# Patient Record
Sex: Female | Born: 1955
Health system: Southern US, Community
[De-identification: ages and names within clinical notes are randomized; demographics above are authoritative.]

## PROBLEM LIST (undated history)

## (undated) DIAGNOSIS — J189 Pneumonia, unspecified organism: Secondary | ICD-10-CM

## (undated) DIAGNOSIS — R32 Unspecified urinary incontinence: Secondary | ICD-10-CM

## (undated) DIAGNOSIS — N281 Cyst of kidney, acquired: Secondary | ICD-10-CM

## (undated) DIAGNOSIS — J449 Chronic obstructive pulmonary disease, unspecified: Secondary | ICD-10-CM

## (undated) DIAGNOSIS — M8580 Other specified disorders of bone density and structure, unspecified site: Secondary | ICD-10-CM

## (undated) DIAGNOSIS — Z87891 Personal history of nicotine dependence: Secondary | ICD-10-CM

## (undated) DIAGNOSIS — K219 Gastro-esophageal reflux disease without esophagitis: Secondary | ICD-10-CM

## (undated) DIAGNOSIS — E785 Hyperlipidemia, unspecified: Secondary | ICD-10-CM

## (undated) DIAGNOSIS — S129XXA Fracture of neck, unspecified, initial encounter: Secondary | ICD-10-CM

## (undated) DIAGNOSIS — Z8659 Personal history of other mental and behavioral disorders: Secondary | ICD-10-CM

## (undated) DIAGNOSIS — F419 Anxiety disorder, unspecified: Secondary | ICD-10-CM

## (undated) DIAGNOSIS — S78112A Complete traumatic amputation at level between left hip and knee, initial encounter: Secondary | ICD-10-CM

## (undated) DIAGNOSIS — I700 Atherosclerosis of aorta: Secondary | ICD-10-CM

## (undated) DIAGNOSIS — Z95828 Presence of other vascular implants and grafts: Secondary | ICD-10-CM

## (undated) DIAGNOSIS — M5136 Other intervertebral disc degeneration, lumbar region: Secondary | ICD-10-CM

## (undated) HISTORY — DX: Cyst of kidney, acquired: N28.1

## (undated) HISTORY — PX: IVC FILTER PLACEMENT (ARMC HX): HXRAD1551

## (undated) HISTORY — DX: Pneumonia, unspecified organism: J18.9

## (undated) HISTORY — DX: Complete traumatic amputation at level between left hip and knee, initial encounter: S78.112A

## (undated) HISTORY — DX: Chronic obstructive pulmonary disease, unspecified: J44.9

## (undated) HISTORY — DX: Personal history of nicotine dependence: Z87.891

## (undated) HISTORY — DX: Unspecified urinary incontinence: R32

## (undated) HISTORY — DX: Other intervertebral disc degeneration, lumbar region: M51.36

## (undated) HISTORY — DX: Atherosclerosis of aorta: I70.0

## (undated) HISTORY — DX: Presence of other vascular implants and grafts: Z95.828

## (undated) HISTORY — DX: Personal history of other mental and behavioral disorders: Z86.59

## (undated) HISTORY — DX: Other specified disorders of bone density and structure, unspecified site: M85.80

---

## 1998-08-31 HISTORY — PX: PARTIAL HYSTERECTOMY: SHX80

## 2000-09-01 ENCOUNTER — Encounter: Payer: Self-pay | Admitting: Family Medicine

## 2000-09-01 ENCOUNTER — Ambulatory Visit (HOSPITAL_COMMUNITY): Admission: RE | Admit: 2000-09-01 | Discharge: 2000-09-01 | Payer: Self-pay | Admitting: *Deleted

## 2003-07-12 ENCOUNTER — Encounter (INDEPENDENT_AMBULATORY_CARE_PROVIDER_SITE_OTHER): Payer: Self-pay | Admitting: Specialist

## 2003-07-12 ENCOUNTER — Ambulatory Visit (HOSPITAL_BASED_OUTPATIENT_CLINIC_OR_DEPARTMENT_OTHER): Admission: RE | Admit: 2003-07-12 | Discharge: 2003-07-12 | Payer: Self-pay | Admitting: Obstetrics and Gynecology

## 2003-07-12 ENCOUNTER — Ambulatory Visit (HOSPITAL_COMMUNITY): Admission: RE | Admit: 2003-07-12 | Discharge: 2003-07-12 | Payer: Self-pay | Admitting: Obstetrics and Gynecology

## 2003-10-30 ENCOUNTER — Encounter (INDEPENDENT_AMBULATORY_CARE_PROVIDER_SITE_OTHER): Payer: Self-pay | Admitting: Specialist

## 2003-10-30 ENCOUNTER — Inpatient Hospital Stay (HOSPITAL_COMMUNITY): Admission: RE | Admit: 2003-10-30 | Discharge: 2003-11-01 | Payer: Self-pay | Admitting: Obstetrics and Gynecology

## 2005-08-31 DIAGNOSIS — M51369 Other intervertebral disc degeneration, lumbar region without mention of lumbar back pain or lower extremity pain: Secondary | ICD-10-CM

## 2005-08-31 DIAGNOSIS — M5136 Other intervertebral disc degeneration, lumbar region: Secondary | ICD-10-CM

## 2005-08-31 HISTORY — DX: Other intervertebral disc degeneration, lumbar region without mention of lumbar back pain or lower extremity pain: M51.369

## 2005-08-31 HISTORY — DX: Other intervertebral disc degeneration, lumbar region: M51.36

## 2005-08-31 HISTORY — PX: LUMBAR DISC SURGERY: SHX700

## 2006-08-31 DIAGNOSIS — Z95828 Presence of other vascular implants and grafts: Secondary | ICD-10-CM

## 2006-08-31 DIAGNOSIS — S78112A Complete traumatic amputation at level between left hip and knee, initial encounter: Secondary | ICD-10-CM

## 2006-08-31 DIAGNOSIS — N281 Cyst of kidney, acquired: Secondary | ICD-10-CM

## 2006-08-31 DIAGNOSIS — S129XXA Fracture of neck, unspecified, initial encounter: Secondary | ICD-10-CM

## 2006-08-31 HISTORY — DX: Presence of other vascular implants and grafts: Z95.828

## 2006-08-31 HISTORY — PX: FRACTURE SURGERY: SHX138

## 2006-08-31 HISTORY — DX: Fracture of neck, unspecified, initial encounter: S12.9XXA

## 2006-08-31 HISTORY — PX: LEG AMPUTATION ABOVE KNEE: SHX117

## 2006-08-31 HISTORY — DX: Complete traumatic amputation at level between left hip and knee, initial encounter: S78.112A

## 2006-08-31 HISTORY — PX: ROTATOR CUFF REPAIR: SHX139

## 2006-08-31 HISTORY — PX: OTHER SURGICAL HISTORY: SHX169

## 2006-08-31 HISTORY — DX: Cyst of kidney, acquired: N28.1

## 2006-08-31 HISTORY — PX: ANKLE SURGERY: SHX546

## 2006-09-24 ENCOUNTER — Inpatient Hospital Stay (HOSPITAL_COMMUNITY): Admission: RE | Admit: 2006-09-24 | Discharge: 2006-09-28 | Payer: Self-pay | Admitting: Neurosurgery

## 2007-06-11 ENCOUNTER — Inpatient Hospital Stay (HOSPITAL_COMMUNITY): Admission: AC | Admit: 2007-06-11 | Discharge: 2007-06-12 | Payer: Self-pay

## 2007-10-12 ENCOUNTER — Encounter: Admission: RE | Admit: 2007-10-12 | Discharge: 2007-12-27 | Payer: Self-pay | Admitting: Orthopedic Surgery

## 2008-06-04 ENCOUNTER — Encounter: Admission: RE | Admit: 2008-06-04 | Discharge: 2008-07-05 | Payer: Self-pay | Admitting: Orthopaedic Surgery
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
## 2009-04-03 ENCOUNTER — Encounter: Admission: RE | Admit: 2009-04-03 | Discharge: 2009-07-02 | Payer: Self-pay | Admitting: Orthopaedic Surgery

## 2009-07-16 ENCOUNTER — Encounter: Admission: RE | Admit: 2009-07-16 | Discharge: 2009-08-14 | Payer: Self-pay | Admitting: Orthopaedic Surgery

## 2010-07-01 ENCOUNTER — Encounter
Admission: RE | Admit: 2010-07-01 | Discharge: 2010-08-26 | Payer: Self-pay | Source: Home / Self Care | Attending: Orthopaedic Surgery | Admitting: Orthopaedic Surgery

## 2010-09-15 ENCOUNTER — Encounter
Admission: RE | Admit: 2010-09-15 | Discharge: 2010-09-30 | Payer: Self-pay | Source: Home / Self Care | Attending: Orthopaedic Surgery | Admitting: Orthopaedic Surgery

## 2010-09-15 ENCOUNTER — Encounter: Admission: RE | Admit: 2010-09-15 | Payer: Self-pay | Source: Home / Self Care | Admitting: Orthopaedic Surgery

## 2010-10-02 ENCOUNTER — Ambulatory Visit: Payer: Medicare Other | Attending: Orthopaedic Surgery | Admitting: Physical Therapy

## 2010-10-02 DIAGNOSIS — R269 Unspecified abnormalities of gait and mobility: Secondary | ICD-10-CM | POA: Insufficient documentation

## 2010-10-02 DIAGNOSIS — R5381 Other malaise: Secondary | ICD-10-CM | POA: Insufficient documentation

## 2010-10-02 DIAGNOSIS — S78119A Complete traumatic amputation at level between unspecified hip and knee, initial encounter: Secondary | ICD-10-CM | POA: Insufficient documentation

## 2010-10-02 DIAGNOSIS — M6281 Muscle weakness (generalized): Secondary | ICD-10-CM | POA: Insufficient documentation

## 2010-10-02 DIAGNOSIS — IMO0001 Reserved for inherently not codable concepts without codable children: Secondary | ICD-10-CM | POA: Insufficient documentation

## 2010-10-09 ENCOUNTER — Ambulatory Visit: Payer: Medicare Other | Admitting: Physical Therapy

## 2010-10-15 ENCOUNTER — Ambulatory Visit: Payer: Medicare Other | Admitting: Physical Therapy

## 2010-10-22 ENCOUNTER — Ambulatory Visit: Payer: Medicare Other | Admitting: Physical Therapy

## 2010-10-28 ENCOUNTER — Encounter: Payer: Medicare Other | Admitting: Physical Therapy

## 2010-11-05 ENCOUNTER — Ambulatory Visit: Payer: Medicare Other | Attending: Orthopaedic Surgery | Admitting: Physical Therapy

## 2010-11-05 ENCOUNTER — Encounter: Payer: Medicare Other | Admitting: Physical Therapy

## 2010-11-05 DIAGNOSIS — M6281 Muscle weakness (generalized): Secondary | ICD-10-CM | POA: Insufficient documentation

## 2010-11-05 DIAGNOSIS — IMO0001 Reserved for inherently not codable concepts without codable children: Secondary | ICD-10-CM | POA: Insufficient documentation

## 2010-11-05 DIAGNOSIS — S78119A Complete traumatic amputation at level between unspecified hip and knee, initial encounter: Secondary | ICD-10-CM | POA: Insufficient documentation

## 2010-11-05 DIAGNOSIS — R269 Unspecified abnormalities of gait and mobility: Secondary | ICD-10-CM | POA: Insufficient documentation

## 2010-11-05 DIAGNOSIS — R5381 Other malaise: Secondary | ICD-10-CM | POA: Insufficient documentation

## 2010-11-12 ENCOUNTER — Ambulatory Visit: Payer: Medicare Other | Admitting: Physical Therapy

## 2010-11-21 ENCOUNTER — Ambulatory Visit: Payer: Medicare Other | Admitting: Physical Therapy

## 2010-11-26 ENCOUNTER — Ambulatory Visit: Payer: Medicare Other | Admitting: Physical Therapy

## 2010-12-03 ENCOUNTER — Ambulatory Visit: Payer: Medicare Other | Attending: Orthopaedic Surgery | Admitting: Physical Therapy

## 2010-12-03 DIAGNOSIS — R269 Unspecified abnormalities of gait and mobility: Secondary | ICD-10-CM | POA: Insufficient documentation

## 2010-12-03 DIAGNOSIS — R5381 Other malaise: Secondary | ICD-10-CM | POA: Insufficient documentation

## 2010-12-03 DIAGNOSIS — M6281 Muscle weakness (generalized): Secondary | ICD-10-CM | POA: Insufficient documentation

## 2010-12-03 DIAGNOSIS — IMO0001 Reserved for inherently not codable concepts without codable children: Secondary | ICD-10-CM | POA: Insufficient documentation

## 2010-12-03 DIAGNOSIS — S78119A Complete traumatic amputation at level between unspecified hip and knee, initial encounter: Secondary | ICD-10-CM | POA: Insufficient documentation

## 2010-12-10 ENCOUNTER — Ambulatory Visit: Payer: Medicare Other | Admitting: Physical Therapy

## 2010-12-17 ENCOUNTER — Ambulatory Visit: Payer: Medicare Other | Admitting: Physical Therapy

## 2010-12-24 ENCOUNTER — Ambulatory Visit: Payer: Medicare Other | Admitting: Physical Therapy

## 2010-12-31 ENCOUNTER — Ambulatory Visit: Payer: Medicare Other | Attending: Orthopaedic Surgery | Admitting: Physical Therapy

## 2010-12-31 DIAGNOSIS — IMO0001 Reserved for inherently not codable concepts without codable children: Secondary | ICD-10-CM | POA: Insufficient documentation

## 2010-12-31 DIAGNOSIS — M6281 Muscle weakness (generalized): Secondary | ICD-10-CM | POA: Insufficient documentation

## 2010-12-31 DIAGNOSIS — S78119A Complete traumatic amputation at level between unspecified hip and knee, initial encounter: Secondary | ICD-10-CM | POA: Insufficient documentation

## 2010-12-31 DIAGNOSIS — R269 Unspecified abnormalities of gait and mobility: Secondary | ICD-10-CM | POA: Insufficient documentation

## 2010-12-31 DIAGNOSIS — R5381 Other malaise: Secondary | ICD-10-CM | POA: Insufficient documentation

## 2011-01-07 ENCOUNTER — Ambulatory Visit: Payer: Medicare Other | Admitting: Physical Therapy
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
## 2011-01-14 ENCOUNTER — Ambulatory Visit: Payer: Medicare Other | Admitting: Physical Therapy

## 2011-01-16 NOTE — Op Note (Signed)
NAMEMATIKA, BARTELL                ACCOUNT NO.:  [id number]   MEDICAL RECORD NO.:  [id number]          PATIENT TYPE:  INP   LOCATION:  2899                         FACILITY:  [facility]   PHYSICIAN:  Danae Orleans. Venetia Maxon, M.D.  DATE OF BIRTH:  1956/05/08   DATE OF PROCEDURE:  09/24/2006  DATE OF DISCHARGE:                               OPERATIVE REPORT   PREOPERATIVE DIAGNOSIS:  Herniated lumbar disk with spondylosis,  degenerative disease and radiculopathy L5-S1 level.   POSTOPERATIVE DIAGNOSIS:  Herniated lumbar disk with spondylosis,  degenerative disease and radiculopathy L5-S1 level.   PROCEDURE:  L5-S1 decompression and bilateral diskectomy with posterior  lumbar interbody fusion (11 mm PEEK spacers) with local bone autograft  and Osteocel with pedicle screw fixation L5-S1 levels bilaterally with  posterolateral arthrodesis.   SURGEON:  Danae Orleans. Venetia Maxon, M.D.   ASSISTANT:  Clydene Fake, M.D.   ANESTHESIA:  General endotracheal anesthesia.   BLOOD LOSS:  Minimal.   COMPLICATIONS:  None.   DISPOSITION:  Recovery.   INDICATIONS:  Rachel Mcintosh is a 55 year old woman with a long history of  low back pain and right leg pain.  She has a large disk herniation at L5-  S1 on the right but also has significant disk degenerative changes with  Modic changes on MRI and disk space height loss with sclerotic endplate  degenerative changes.  It was elected to take her to surgery for  decompression and fusion at the L5-S1 level.   PROCEDURE:  Ms. Dickison was brought to the operating room.  Following the  satisfactory and uncomplicated induction of general endotracheal  anesthesia and placement of intravenous lines, the patient was placed in  a prone position on the operating table on chest rolls.  Her low back  was then prepped and draped in the usual sterile fashion.  The area of  planned incision was infiltrated with 0.25% Marcaine and 0.50% lidocaine  with 1:200,000 epinephrine.   Incision was made in the midline, carried  through adipose tissue to the lumbodorsal fascia which was incised  bilaterally.  Subperiosteal dissection was performed exposing the L5  transverse processes of the sacral alae and self-retaining retractor was  placed to facilitate exposure.  Intraoperative x-ray confirmed correct  orientation with marker probes on the L5 transverse process and sacral  ala.  Subsequently bilateral laminectomies of L5 were performed using a  Leksell rongeur and Kerrison rongeurs and partial facetectomy was also  performed.  The bone was saved and cleaned and later used for bone  grafting.  Under loupe magnification, the right side of the canal was  explored and multiple large fragments of herniated disk material were  removed which were directly compressing the lateral aspect of the thecal  sac and S1 nerve root.  Subsequently the interspace was incised and disk  material was removed but the disk space was quite collapsed.  Attention  was then turned to the left side where similar decompression was  performed and at this endplate preparation shavers were placed and  sequentially dilated with decompression of the interspace and eventually  this disk  space was loosened significantly to be able to gain access  into the interspace and medial and lateral aspects of the interspace  were cleared of disk material.  Using the sequential shavers up to a 10  mm size, the endplates were prepared for implants and the interspace was  aggressively evacuated of any residual disk material.  An 11 mm x 20 mm  trial sizer was placed on the left and attention was then returned to  the right side where again the endplates were prepared for grafting and  the interspace was evacuated of residual disk material.  Subsequently a  11 x 20 mm insert and rotate PEEK cage was selected.  This was packed  with morcellized bone autograft supplemented with Osteocel and inserted  in the interspace,  countersunk appropriately and rotated into position.  Attention was then returned to the opposite side and a large amount of  bone graft was then inserted in the interspace and tamped into position  both medially and laterally. Approximately 10 mL of bone graft was  eventually placed within the interspace.  A similarly sized cage was  placed and rotated into position, then additional bone graft was  inserted medial to this, lateral to it and on the other side as well.  Lateral C-arm radiograph showed restoration of disk space height and  well-positioned cages.  Subsequently, pedicle screws were then placed  using 6.5 x 40 mm sacral screws at the S1 level and 6.5 x 45 mm screws  at L5.  All screws had excellent purchase, no evidence of any cutouts  and this positioning screws was confirmed on AP and lateral fluoroscopy.  Prior to placing the rods and screws, the L5-S1 levels were decorticated  for posterolateral bone grafting and this was then performed and the  remaining bone graft was placed overlying the facet joint complexes in  the posterolateral region.  The 35 mm rods were then placed and locked  down in situ.  The neural elements were inspected and found to be in  good repair.  The retractors were then removed, the lumbodorsal fascia  was closed with #1 Vicryl sutures.  The subcutaneous tissues were  reapproximated with 2-0 Vicryl interrupted inverted sutures.  The skin  edges were approximated interrupted with 3-0 Vicryl subcuticular stitch.  The wound was dressed with Benzoin and Steri-Strips with gauze and tape.  The patient was extubated in the operating room, taken to the recovery  in stable satisfactory condition, have tolerated her operation well.  Counts were correct at the end of the case.      Danae Orleans. Venetia Maxon, M.D.  Electronically Signed     JDS/MEDQ  D:  09/24/2006  T:  09/24/2006  Job:  [id number]

## 2011-01-16 NOTE — Discharge Summary (Signed)
[name], [name]                ACCOUNT NO.:  [id number]   MEDICAL RECORD NO.:  [id number]          PATIENT TYPE:  INP   LOCATION:  3001                         FACILITY:  [facility]   PHYSICIAN:  Danae Orleans. Venetia Maxon, M.D.  DATE OF BIRTH:  01-31-1956   DATE OF ADMISSION:  09/24/2006  DATE OF DISCHARGE:  09/28/2006                               DISCHARGE SUMMARY   REASON FOR ADMISSION:  Lumbar disk disease with myelopathy, tobacco use  disorder, bone cartilage disorder, esophageal reflux.   FINAL DIAGNOSIS:  Lumbar disk disease with myelopathy, tobacco use  disorder, bone cartilage disorder, esophageal reflux.   HISTORY OF ILLNESS AND HOSPITAL COURSE:  Rachel Mcintosh is a 50-year  woman with lumbar disk herniation at L5-S1 and marked degenerative disk  disease.  She was admitted to the hospital, underwent uncomplicated  lumbar decompression and fusion at the L5-S1 level.  She tolerated the  procedure well and was gradually mobilized with physical therapy and was  discharged home in improved state with discharge medications of  Percocet, Valium, and Lyrica with follow up in 3 weeks in the office.      Danae Orleans. Venetia Maxon, M.D.  Electronically Signed     JDS/MEDQ  D:  12/30/2006  T:  12/31/2006  Job:  [id number]

## 2011-01-16 NOTE — H&P (Signed)
NAME:  Rachel Mcintosh, Rachel Mcintosh                          ACCOUNT NO.:  [id number]   MEDICAL RECORD NO.:  [id number]                   PATIENT TYPE:  INP   LOCATION:  NA                                   FACILITY:  [facility]   PHYSICIAN:  Katherine Roan, M.D.               DATE OF BIRTH:  1955-11-08   DATE OF ADMISSION:  DATE OF DISCHARGE:                                HISTORY & PHYSICAL   CHIEF COMPLAINT:  Continued heavy periods.   HISTORY OF PRESENT ILLNESS:  Rachel Mcintosh is a 55 year old gravida 3, para 3  female whose last menstrual period was approximately 12 days ago, who  presents for a hysterectomy for continued heavy periods despite birth  control pills, despite a hysteroscopy with resection.  The endometrial  pathology was benign.  Her Pap smear was normal.   CURRENT MEDICATIONS:  1. Paxil 30 mg.  2. Intermittently Prevacid for reflux.  She states this is getting better     and she has not had to take anything but Tums on a p.r.n. basis.   PAST SURGICAL HISTORY:  1. Tubal ligation.  2. Hysteroscopy.   ALLERGIES:  1. SULFA.  2. CODEINE.  She has no latex allergies.   She states she had a blood transfusion as an infant.   REVIEW OF SYSTEMS:  HEENT:  She wears glasses.  No headaches, no decrease in  visual or auditory acuity.  HEART:  No history of hypertension, no history  of mitral valve prolapse or heart murmur.  LUNGS:  No chronic cough, no  asthma.  She does smoke 1-1/2 packs of cigarettes daily.  GENITOURINARY:  She denies stress or urge.  GASTROINTESTINAL:  She has a history of reflux,  although as noted above, she is doing better, and does not take anything on  a regular basis.  She has had no bowel habit changes.  MUSCLES, BONES, AND  JOINTS:  No fractures or arthritis.   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  She drinks alcohol  socially and smokes 1-1/2 packs of cigarettes daily.   FAMILY HISTORY:  Mother and father are both 94, living and well.  She  has a  paternal grandfather with prostate cancer and a paternal grandmother who has  had a stroke.   PHYSICAL EXAMINATION:  GENERAL:  A well-developed, well-nourished female who  appears to be her stated age of 55.  VITAL SIGNS:  Her weight is 121, blood pressure 120/70, pulse 80.  HEENT:  Unremarkable.  Oropharynx is not injected.  NECK:  Supple.  The thyroid is not enlarged.  Carotid pulses are equal  without bruits.  Trachea is in the midline. No adenopathy appreciated in the  neck or supraclavicular areas.  LUNGS:  Clear to auscultation and percussion.  HEART:  Normal sinus rhythm, no murmurs, no heaves, thrills, rubs, or  gallops.  BREASTS:  No masses  or tenderness.  ABDOMEN:  Liver, spleen, and kidneys are not felt.  No tenderness, no  bruits.  Bowel sounds appear to be normal.  Femoral pulses are equal.  PELVIC:  A clean cervix.  Uterus is retroverted and nodular, it is mobile,  no adnexal masses are noted.  Rectovaginal confirms.  EXTREMITIES:  Good range of motion, equal pulses and reflexes.   IMPRESSION:  Persistent abnormal uterine bleeding despite conservative  efforts.   PLAN:  Total hysterectomy with ovarian conservation if at all possible.  I  have outlined in detail the risks of the procedure, including infection,  hemorrhage, damage to bowel, bladder, vascular injuries, and anesthetic  complications.  Plan is to proceed with hysterectomy.  The patient has  verbalized that she understand all the risks.                                               Katherine Roan, M.D.    SDM/MEDQ  D:  10/29/2003  T:  10/29/2003  Job:  [id number]

## 2011-06-11 LAB — TYPE AND SCREEN: Antibody Screen: NEGATIVE

## 2011-06-11 LAB — CBC
HCT: 16.4 — ABNORMAL LOW
HCT: 25.5 — ABNORMAL LOW
HCT: 27.3 — ABNORMAL LOW
HCT: 36.1
Hemoglobin: 12.6
Hemoglobin: 5.8 — CL
Hemoglobin: 8.7 — ABNORMAL LOW
Hemoglobin: 9.1 — ABNORMAL LOW
Hemoglobin: 9.2 — ABNORMAL LOW
MCHC: 34.1
MCHC: 35.5
MCV: 87.9
MCV: 91.2
MCV: 97.1
Platelets: 103 — ABNORMAL LOW
Platelets: 106 — ABNORMAL LOW
RBC: 2.78 — ABNORMAL LOW
RBC: 3 — ABNORMAL LOW
RBC: 3.72 — ABNORMAL LOW
RDW: 14
RDW: 15.6 — ABNORMAL HIGH
RDW: 18.3 — ABNORMAL HIGH
WBC: 11.6 — ABNORMAL HIGH
WBC: 5.1
WBC: 7.5

## 2011-06-11 LAB — I-STAT 8, (EC8 V) (CONVERTED LAB)
Acid-base deficit: 6 — ABNORMAL HIGH
Bicarbonate: 18.8 — ABNORMAL LOW
Chloride: 101
Glucose, Bld: 147 — ABNORMAL HIGH
Hemoglobin: 13.3
Operator id: 272551
Sodium: 134 — ABNORMAL LOW
TCO2: 20
pCO2, Ven: 35.4 — ABNORMAL LOW

## 2011-06-11 LAB — COMPREHENSIVE METABOLIC PANEL
ALT: 23
Alkaline Phosphatase: 21 — ABNORMAL LOW
Alkaline Phosphatase: 27 — ABNORMAL LOW
BUN: 2 — ABNORMAL LOW
BUN: 8
CO2: 27
Creatinine, Ser: 0.44
GFR calc non Af Amer: >60
Glucose, Bld: 117 — ABNORMAL HIGH
Glucose, Bld: 140 — ABNORMAL HIGH
Potassium: 3.3 — ABNORMAL LOW
Potassium: 4.5
Sodium: 138
Total Protein: 3.3 — ABNORMAL LOW

## 2011-06-11 LAB — POCT I-STAT 7, (LYTES, BLD GAS, ICA,H+H)
Acid-base deficit: 13 — ABNORMAL HIGH
Acid-base deficit: 14 — ABNORMAL HIGH
Bicarbonate: 13.7 — ABNORMAL LOW
Bicarbonate: 14.1 — ABNORMAL LOW
Calcium, Ion: 1.02 — ABNORMAL LOW
HCT: 23 — ABNORMAL LOW
HCT: 31 — ABNORMAL LOW
Hemoglobin: 7.8 — CL
O2 Saturation: 100
Operator id: 117071
Operator id: 177011
Sodium: 139
Sodium: 139
pH, Arterial: 7.162 — CL
pO2, Arterial: 36 — CL
pO2, Arterial: 527 — ABNORMAL HIGH

## 2011-06-11 LAB — ETHANOL: Alcohol, Ethyl (B): 253 — ABNORMAL HIGH

## 2011-06-11 LAB — POCT I-STAT 3, ART BLOOD GAS (G3+)
Acid-base deficit: 6 — ABNORMAL HIGH
Bicarbonate: 20.1
O2 Saturation: 99
Patient temperature: 34.3
TCO2: 21
pO2, Arterial: 126 — ABNORMAL HIGH

## 2011-06-11 LAB — PREPARE FRESH FROZEN PLASMA

## 2011-06-11 LAB — PROTIME-INR
INR: 1.1
INR: 1.5
Prothrombin Time: 17.8 — ABNORMAL HIGH
Prothrombin Time: 18.8 — ABNORMAL HIGH

## 2011-09-02 ENCOUNTER — Ambulatory Visit
Admission: RE | Admit: 2011-09-02 | Discharge: 2011-09-02 | Disposition: A | Discharge status: Home / Self Care | Payer: Medicare Other | Source: Ambulatory Visit | Attending: Family Medicine | Admitting: Family Medicine

## 2011-09-02 ENCOUNTER — Other Ambulatory Visit: Payer: Self-pay | Admitting: Family Medicine

## 2011-09-02 DIAGNOSIS — K219 Gastro-esophageal reflux disease without esophagitis: Secondary | ICD-10-CM | POA: Diagnosis not present

## 2011-09-02 DIAGNOSIS — R079 Chest pain, unspecified: Secondary | ICD-10-CM

## 2012-04-04 DIAGNOSIS — K219 Gastro-esophageal reflux disease without esophagitis: Secondary | ICD-10-CM | POA: Diagnosis not present

## 2012-04-04 DIAGNOSIS — S88919A Complete traumatic amputation of unspecified lower leg, level unspecified, initial encounter: Secondary | ICD-10-CM | POA: Diagnosis not present

## 2012-04-04 DIAGNOSIS — F411 Generalized anxiety disorder: Secondary | ICD-10-CM | POA: Diagnosis not present

## 2012-04-04 DIAGNOSIS — M79609 Pain in unspecified limb: Secondary | ICD-10-CM | POA: Diagnosis not present

## 2012-04-29 ENCOUNTER — Other Ambulatory Visit: Payer: Self-pay | Admitting: Gastroenterology

## 2012-04-29 DIAGNOSIS — K219 Gastro-esophageal reflux disease without esophagitis: Secondary | ICD-10-CM | POA: Diagnosis not present

## 2012-05-05 ENCOUNTER — Ambulatory Visit
Admission: RE | Admit: 2012-05-05 | Discharge: 2012-05-05 | Disposition: A | Discharge status: Home / Self Care | Payer: Medicare Other | Source: Ambulatory Visit | Attending: Gastroenterology | Admitting: Gastroenterology

## 2012-05-05 DIAGNOSIS — K219 Gastro-esophageal reflux disease without esophagitis: Secondary | ICD-10-CM

## 2012-05-11 ENCOUNTER — Ambulatory Visit: Payer: Medicare Other | Admitting: Rehabilitative and Restorative Service Providers"

## 2012-05-12 ENCOUNTER — Ambulatory Visit: Payer: Medicare Other | Admitting: Physical Therapy

## 2012-05-12 DIAGNOSIS — Z1211 Encounter for screening for malignant neoplasm of colon: Secondary | ICD-10-CM | POA: Diagnosis not present

## 2012-05-12 DIAGNOSIS — Z23 Encounter for immunization: Secondary | ICD-10-CM | POA: Diagnosis not present

## 2012-05-12 DIAGNOSIS — F17200 Nicotine dependence, unspecified, uncomplicated: Secondary | ICD-10-CM | POA: Diagnosis not present

## 2012-05-12 DIAGNOSIS — Z13220 Encounter for screening for lipoid disorders: Secondary | ICD-10-CM | POA: Diagnosis not present

## 2012-05-12 DIAGNOSIS — S88919A Complete traumatic amputation of unspecified lower leg, level unspecified, initial encounter: Secondary | ICD-10-CM | POA: Diagnosis not present

## 2012-05-12 DIAGNOSIS — Z0000 Encounter for general adult medical examination without abnormal findings: Secondary | ICD-10-CM | POA: Diagnosis not present

## 2012-05-12 DIAGNOSIS — F411 Generalized anxiety disorder: Secondary | ICD-10-CM | POA: Diagnosis not present

## 2012-05-12 DIAGNOSIS — K219 Gastro-esophageal reflux disease without esophagitis: Secondary | ICD-10-CM | POA: Diagnosis not present

## 2012-05-12 DIAGNOSIS — Z136 Encounter for screening for cardiovascular disorders: Secondary | ICD-10-CM | POA: Diagnosis not present

## 2012-05-23 DIAGNOSIS — K219 Gastro-esophageal reflux disease without esophagitis: Secondary | ICD-10-CM | POA: Diagnosis not present

## 2012-05-23 DIAGNOSIS — Z1211 Encounter for screening for malignant neoplasm of colon: Secondary | ICD-10-CM | POA: Diagnosis not present

## 2012-05-24 DIAGNOSIS — M25559 Pain in unspecified hip: Secondary | ICD-10-CM | POA: Diagnosis not present

## 2012-05-24 DIAGNOSIS — M795 Residual foreign body in soft tissue: Secondary | ICD-10-CM | POA: Diagnosis not present

## 2012-05-31 ENCOUNTER — Encounter (HOSPITAL_COMMUNITY)
Admission: RE | Admit: 2012-05-31 | Discharge: 2012-05-31 | Disposition: A | Discharge status: Home / Self Care | Payer: Medicare Other | Source: Ambulatory Visit | Attending: Orthopedic Surgery | Admitting: Orthopedic Surgery

## 2012-05-31 ENCOUNTER — Other Ambulatory Visit (HOSPITAL_COMMUNITY): Payer: Self-pay | Admitting: Orthopedic Surgery

## 2012-05-31 ENCOUNTER — Encounter (HOSPITAL_COMMUNITY): Payer: Self-pay

## 2012-05-31 HISTORY — PX: COLONOSCOPY: SHX174

## 2012-05-31 HISTORY — DX: Anxiety disorder, unspecified: F41.9

## 2012-05-31 HISTORY — DX: Hyperlipidemia, unspecified: E78.5

## 2012-05-31 HISTORY — DX: Fracture of neck, unspecified, initial encounter: S12.9XXA

## 2012-05-31 HISTORY — DX: Gastro-esophageal reflux disease without esophagitis: K21.9

## 2012-05-31 LAB — COMPREHENSIVE METABOLIC PANEL
Alkaline Phosphatase: 82 U/L (ref 39–117)
BUN: 6 mg/dL (ref 6–23)
Chloride: 99 mEq/L (ref 96–112)
Creatinine, Ser: 0.52 mg/dL (ref 0.50–1.10)
GFR calc Af Amer: >90 mL/min (ref >90)
GFR calc non Af Amer: >90 mL/min (ref >90)
Glucose, Bld: 79 mg/dL (ref 70–99)
Potassium: 3.4 mEq/L — ABNORMAL LOW (ref 3.5–5.1)
Total Bilirubin: 0.3 mg/dL (ref 0.3–1.2)

## 2012-05-31 LAB — SURGICAL PCR SCREEN
MRSA, PCR: NEGATIVE
Staphylococcus aureus: NEGATIVE

## 2012-05-31 LAB — CBC
HCT: 42.3 % (ref 36.0–46.0)
Hemoglobin: 14.8 g/dL (ref 12.0–15.0)
MCV: 87.4 fL (ref 78.0–100.0)
RDW: 12.9 % (ref 11.5–15.5)
WBC: 9.2 10*3/uL (ref 4.0–10.5)

## 2012-05-31 LAB — APTT: aPTT: 32 seconds (ref 24–37)

## 2012-05-31 MED ORDER — CEFAZOLIN SODIUM-DEXTROSE 2-3 GM-% IV SOLR
2.0000 g | INTRAVENOUS | Status: AC
  Administered 2012-06-01: 2 g via INTRAVENOUS
  Filled 2012-05-31: qty 50

## 2012-05-31 NOTE — Pre-Procedure Instructions (Signed)
20 Rachel Mcintosh  05/31/2012   Your procedure is scheduled on:  Wednesday June 01, 2012  Report to [facility] Stay [facility] at 7:45 AM.  Call this number if you have problems the morning of surgery: [phone]   Remember:   Do not eat food or drink :After Midnight.      Take these medicines the morning of surgery with A SIP OF WATER: paxil   Do not wear jewelry, make-up or nail polish.  Do not wear lotions, powders, or perfumes. You may wear deodorant.  Do not shave 48 hours prior to surgery. Men may shave face and neck.  Do not bring valuables to the hospital.  Contacts, dentures or bridgework may not be worn into surgery.  Leave suitcase in the car. After surgery it may be brought to your room.  For patients admitted to the hospital, checkout time is 11:00 AM the day of discharge.   Patients discharged the day of surgery will not be allowed to drive home.  Name and phone number of your driver: family / friend  Special Instructions: Shower using CHG 2 nights before surgery and the night before surgery.  If you shower the day of surgery use CHG.  Use special wash - you have one bottle of CHG for all showers.  You should use approximately 1/3 of the bottle for each shower.   Please read over the following fact sheets that you were given: Pain Booklet, Coughing and Deep Breathing, MRSA Information and Surgical Site Infection Prevention

## 2012-06-01 ENCOUNTER — Encounter (HOSPITAL_COMMUNITY): Payer: Self-pay | Admitting: Critical Care Medicine

## 2012-06-01 ENCOUNTER — Ambulatory Visit (HOSPITAL_COMMUNITY): Payer: Medicare Other | Admitting: Critical Care Medicine

## 2012-06-01 ENCOUNTER — Ambulatory Visit (HOSPITAL_COMMUNITY)
Admission: RE | Admit: 2012-06-01 | Discharge: 2012-06-01 | DRG: 475 | Disposition: A | Discharge status: Home / Self Care | Payer: Medicare Other | Source: Ambulatory Visit | Attending: Orthopedic Surgery | Admitting: Orthopedic Surgery

## 2012-06-01 ENCOUNTER — Encounter (HOSPITAL_COMMUNITY)
Admission: RE | Disposition: A | Discharge status: Home / Self Care | Payer: Self-pay | Source: Ambulatory Visit | Attending: Orthopedic Surgery

## 2012-06-01 DIAGNOSIS — S78119A Complete traumatic amputation at level between unspecified hip and knee, initial encounter: Secondary | ICD-10-CM | POA: Diagnosis not present

## 2012-06-01 DIAGNOSIS — F411 Generalized anxiety disorder: Secondary | ICD-10-CM | POA: Diagnosis present

## 2012-06-01 DIAGNOSIS — T8740 Infection of amputation stump, unspecified extremity: Secondary | ICD-10-CM | POA: Diagnosis present

## 2012-06-01 DIAGNOSIS — K219 Gastro-esophageal reflux disease without esophagitis: Secondary | ICD-10-CM | POA: Diagnosis not present

## 2012-06-01 DIAGNOSIS — Z01812 Encounter for preprocedural laboratory examination: Secondary | ICD-10-CM

## 2012-06-01 DIAGNOSIS — M86659 Other chronic osteomyelitis, unspecified thigh: Secondary | ICD-10-CM | POA: Diagnosis not present

## 2012-06-01 DIAGNOSIS — L02419 Cutaneous abscess of limb, unspecified: Secondary | ICD-10-CM | POA: Diagnosis present

## 2012-06-01 DIAGNOSIS — F17200 Nicotine dependence, unspecified, uncomplicated: Secondary | ICD-10-CM | POA: Diagnosis present

## 2012-06-01 DIAGNOSIS — Z882 Allergy status to sulfonamides status: Secondary | ICD-10-CM

## 2012-06-01 DIAGNOSIS — L03119 Cellulitis of unspecified part of limb: Secondary | ICD-10-CM | POA: Diagnosis not present

## 2012-06-01 DIAGNOSIS — E785 Hyperlipidemia, unspecified: Secondary | ICD-10-CM | POA: Diagnosis not present

## 2012-06-01 DIAGNOSIS — Z888 Allergy status to other drugs, medicaments and biological substances status: Secondary | ICD-10-CM

## 2012-06-01 DIAGNOSIS — Y835 Amputation of limb(s) as the cause of abnormal reaction of the patient, or of later complication, without mention of misadventure at the time of the procedure: Secondary | ICD-10-CM | POA: Diagnosis present

## 2012-06-01 DIAGNOSIS — L02416 Cutaneous abscess of left lower limb: Secondary | ICD-10-CM

## 2012-06-01 DIAGNOSIS — M869 Osteomyelitis, unspecified: Secondary | ICD-10-CM | POA: Diagnosis present

## 2012-06-01 HISTORY — PX: OSTEOCHONDROMA EXCISION: SHX2137

## 2012-06-01 SURGERY — EXCISION, OSTEOCHONDROMA
Anesthesia: General | Site: Leg Lower | Laterality: Left | Wound class: Clean

## 2012-06-01 MED ORDER — VANCOMYCIN HCL 500 MG IV SOLR
INTRAVENOUS | Status: AC
  Filled 2012-06-01: qty 500

## 2012-06-01 MED ORDER — GENTAMICIN SULFATE 40 MG/ML IJ SOLN
INTRAMUSCULAR | Status: DC | PRN
  Administered 2012-06-01: 160 mg

## 2012-06-01 MED ORDER — HYDROCODONE-ACETAMINOPHEN 5-500 MG PO TABS: 1.0000 | ORAL_TABLET | Freq: Four times a day (QID) | ORAL | Status: DC | PRN

## 2012-06-01 MED ORDER — LACTATED RINGERS IV SOLN
INTRAVENOUS | Status: DC
  Administered 2012-06-01: 09:00:00 via INTRAVENOUS

## 2012-06-01 MED ORDER — LACTATED RINGERS IV SOLN
INTRAVENOUS | Status: DC | PRN
  Administered 2012-06-01 (×2): via INTRAVENOUS

## 2012-06-01 MED ORDER — PHENYLEPHRINE HCL 10 MG/ML IJ SOLN
INTRAMUSCULAR | Status: DC | PRN
  Administered 2012-06-01: 80 ug via INTRAVENOUS
  Administered 2012-06-01: 40 ug via INTRAVENOUS

## 2012-06-01 MED ORDER — LIDOCAINE HCL (CARDIAC) 20 MG/ML IV SOLN
INTRAVENOUS | Status: DC | PRN
  Administered 2012-06-01: 50 mg via INTRAVENOUS

## 2012-06-01 MED ORDER — MIDAZOLAM HCL 5 MG/5ML IJ SOLN
INTRAMUSCULAR | Status: DC | PRN
  Administered 2012-06-01: 2 mg via INTRAVENOUS

## 2012-06-01 MED ORDER — DOXYCYCLINE HYCLATE 100 MG PO TABS: 100.0000 mg | ORAL_TABLET | Freq: Two times a day (BID) | ORAL | Status: DC

## 2012-06-01 MED ORDER — EPHEDRINE SULFATE 50 MG/ML IJ SOLN
INTRAMUSCULAR | Status: DC | PRN
  Administered 2012-06-01: 5 mg via INTRAVENOUS
  Administered 2012-06-01: 10 mg via INTRAVENOUS

## 2012-06-01 MED ORDER — HYDROMORPHONE HCL PF 1 MG/ML IJ SOLN
0.2500 mg | INTRAMUSCULAR | Status: DC | PRN
  Administered 2012-06-01 (×2): 0.5 mg via INTRAVENOUS

## 2012-06-01 MED ORDER — HYDROMORPHONE HCL PF 1 MG/ML IJ SOLN
INTRAMUSCULAR | Status: AC
  Filled 2012-06-01: qty 1

## 2012-06-01 MED ORDER — GENTAMICIN SULFATE 40 MG/ML IJ SOLN
INTRAMUSCULAR | Status: AC
  Filled 2012-06-01: qty 4

## 2012-06-01 MED ORDER — VANCOMYCIN HCL 500 MG IV SOLR
INTRAVENOUS | Status: DC | PRN
  Administered 2012-06-01: 500 mg

## 2012-06-01 MED ORDER — ONDANSETRON HCL 4 MG/2ML IJ SOLN
INTRAMUSCULAR | Status: DC | PRN
  Administered 2012-06-01: 4 mg via INTRAVENOUS

## 2012-06-01 MED ORDER — FENTANYL CITRATE 0.05 MG/ML IJ SOLN
INTRAMUSCULAR | Status: DC | PRN
  Administered 2012-06-01: 25 ug via INTRAVENOUS
  Administered 2012-06-01: 100 ug via INTRAVENOUS
  Administered 2012-06-01: 25 ug via INTRAVENOUS

## 2012-06-01 MED ORDER — DROPERIDOL 2.5 MG/ML IJ SOLN: 0.6250 mg | INTRAMUSCULAR | Status: DC | PRN

## 2012-06-01 MED ORDER — PROPOFOL 10 MG/ML IV BOLUS
INTRAVENOUS | Status: DC | PRN
  Administered 2012-06-01: 120 mg via INTRAVENOUS
  Administered 2012-06-01: 80 mg via INTRAVENOUS

## 2012-06-01 MED ORDER — 0.9 % SODIUM CHLORIDE (POUR BTL) OPTIME
TOPICAL | Status: DC | PRN
  Administered 2012-06-01: 1000 mL

## 2012-06-01 SURGICAL SUPPLY — 46 items
BANDAGE GAUZE ELAST BULKY 4 IN (GAUZE/BANDAGES/DRESSINGS) ×1 IMPLANT
BLADE MINI RND TIP GREEN BEAV (BLADE) IMPLANT
BNDG CMPR 9X4 STRL LF SNTH (GAUZE/BANDAGES/DRESSINGS) ×1
BNDG COHESIVE 6X5 TAN STRL LF (GAUZE/BANDAGES/DRESSINGS) ×1 IMPLANT
BNDG ESMARK 4X9 LF (GAUZE/BANDAGES/DRESSINGS) ×2 IMPLANT
BNDG GAUZE STRTCH 6 (GAUZE/BANDAGES/DRESSINGS) IMPLANT
CLOTH BEACON ORANGE TIMEOUT ST (SAFETY) ×2 IMPLANT
CORDS BIPOLAR (ELECTRODE) ×2 IMPLANT
COTTON STERILE ROLL (GAUZE/BANDAGES/DRESSINGS) IMPLANT
COVER SURGICAL LIGHT HANDLE (MISCELLANEOUS) ×2 IMPLANT
CUFF TOURNIQUET SINGLE 18IN (TOURNIQUET CUFF) IMPLANT
CUFF TOURNIQUET SINGLE 24IN (TOURNIQUET CUFF) IMPLANT
DRAPE OEC MINIVIEW 54X84 (DRAPES) IMPLANT
DRAPE U-SHAPE 47X51 STRL (DRAPES) ×2 IMPLANT
DRSG ADAPTIC 3X8 NADH LF (GAUZE/BANDAGES/DRESSINGS) ×1 IMPLANT
DRSG PAD ABDOMINAL 8X10 ST (GAUZE/BANDAGES/DRESSINGS) ×1 IMPLANT
DURAPREP 26ML APPLICATOR (WOUND CARE) ×2 IMPLANT
ELECT REM PT RETURN 9FT ADLT (ELECTROSURGICAL) ×2
ELECTRODE REM PT RTRN 9FT ADLT (ELECTROSURGICAL) ×1 IMPLANT
GLOVE BIOGEL PI IND STRL 9 (GLOVE) ×1 IMPLANT
GLOVE BIOGEL PI INDICATOR 9 (GLOVE) ×1
GLOVE SURG ORTHO 9.0 STRL STRW (GLOVE) ×2 IMPLANT
GOWN PREVENTION PLUS XLARGE (GOWN DISPOSABLE) ×2 IMPLANT
GOWN SRG XL XLNG 56XLVL 4 (GOWN DISPOSABLE) ×1 IMPLANT
GOWN STRL NON-REIN XL XLG LVL4 (GOWN DISPOSABLE) ×2
KIT BASIN OR (CUSTOM PROCEDURE TRAY) ×2 IMPLANT
KIT ROOM TURNOVER OR (KITS) ×2 IMPLANT
KIT STIMULAN RAPID CURE 5CC (Orthopedic Implant) ×1 IMPLANT
MANIFOLD NEPTUNE II (INSTRUMENTS) ×2 IMPLANT
NDL HYPO 25GX1X1/2 BEV (NEEDLE) IMPLANT
NEEDLE HYPO 25GX1X1/2 BEV (NEEDLE) IMPLANT
NS IRRIG 1000ML POUR BTL (IV SOLUTION) ×2 IMPLANT
PACK ORTHO EXTREMITY (CUSTOM PROCEDURE TRAY) ×2 IMPLANT
PAD ARMBOARD 7.5X6 YLW CONV (MISCELLANEOUS) ×4 IMPLANT
PAD CAST 4YDX4 CTTN HI CHSV (CAST SUPPLIES) IMPLANT
PADDING CAST COTTON 4X4 STRL (CAST SUPPLIES)
SPECIMEN JAR SMALL (MISCELLANEOUS) ×2 IMPLANT
SPONGE GAUZE 4X4 12PLY (GAUZE/BANDAGES/DRESSINGS) ×1 IMPLANT
SUCTION FRAZIER TIP 10 FR DISP (SUCTIONS) IMPLANT
SUT ETHILON 2 0 FS 18 (SUTURE) IMPLANT
SUT VIC AB 2-0 FS1 27 (SUTURE) IMPLANT
SYR CONTROL 10ML LL (SYRINGE) IMPLANT
TOWEL OR 17X24 6PK STRL BLUE (TOWEL DISPOSABLE) ×2 IMPLANT
TOWEL OR 17X26 10 PK STRL BLUE (TOWEL DISPOSABLE) ×2 IMPLANT
TUBE CONNECTING 12X1/4 (SUCTIONS) IMPLANT
WATER STERILE IRR 1000ML POUR (IV SOLUTION) ×2 IMPLANT

## 2012-06-01 NOTE — Transfer of Care (Signed)
Immediate Anesthesia Transfer of Care Note  Patient: Rachel Mcintosh  Procedure(s) Performed: Procedure(s) (LRB) with comments: OSTEOCHONDROMA EXCISION (Left) - Excision of Bone Spur Left Above Knee Amputation  Patient Location: PACU  Anesthesia Type: General  Level of Consciousness: awake, alert  and oriented  Airway & Oxygen Therapy: Patient Spontanous Breathing and Patient connected to nasal cannula oxygen  Post-op Assessment: Report given to PACU RN, Post -op Vital signs reviewed and stable and Patient moving all extremities X 4  Post vital signs: Reviewed and stable  Complications: No apparent anesthesia complications
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
## 2012-06-01 NOTE — Preoperative (Signed)
Beta Blockers   Reason not to administer Beta Blockers:Not Applicable 

## 2012-06-01 NOTE — Anesthesia Postprocedure Evaluation (Signed)
Anesthesia Post Note  Patient: Rachel Mcintosh  Procedure(s) Performed: Procedure(s) (LRB): OSTEOCHONDROMA EXCISION (Left)  Anesthesia type: general  Patient location: PACU  Post pain: Pain level controlled  Post assessment: Patient's Cardiovascular Status Stable  Last Vitals:  Filed Vitals:   06/01/12 1150  BP: 105/62  Pulse: 91  Temp:   Resp: 12    Post vital signs: Reviewed and stable  Level of consciousness: sedated  Complications: No apparent anesthesia complications
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
## 2012-06-01 NOTE — Anesthesia Procedure Notes (Signed)
Procedure Name: LMA Insertion Date/Time: 06/01/2012 9:45 AM Performed by: Elon Alas Pre-anesthesia Checklist: Patient identified, Timeout performed, Emergency Drugs available, Suction available and Patient being monitored Patient Re-evaluated:Patient Re-evaluated prior to inductionOxygen Delivery Method: Circle system utilized Preoxygenation: Pre-oxygenation with 100% oxygen Intubation Type: IV induction Ventilation: Mask ventilation without difficulty LMA: LMA inserted LMA Size: 4.0 Number of attempts: 1 Placement Confirmation: positive ETCO2,  ETT inserted through vocal cords under direct vision and breath sounds checked- equal and bilateral Tube secured with: Tape Dental Injury: Teeth and Oropharynx as per pre-operative assessment  Comments: LMA inserted by EMT student

## 2012-06-01 NOTE — Anesthesia Preprocedure Evaluation (Addendum)
Anesthesia Evaluation   Patient awake    Reviewed: Allergy & Precautions, H&P , NPO status , Patient's Chart, lab work & pertinent test results  History of Anesthesia Complications (+) Family history of anesthesia reaction  Airway Mallampati: III TM Distance: >3 FB Neck ROM: full    Dental  (+) Teeth Intact and Dental Advidsory Given   Pulmonary Current Smoker,  breath sounds clear to auscultation  Pulmonary exam normal       Cardiovascular Rhythm:regular Rate:Normal     Neuro/Psych PSYCHIATRIC DISORDERS Anxiety    GI/Hepatic GERD-  Medicated and Controlled,  Endo/Other    Renal/GU      Musculoskeletal   Abdominal   Peds  Hematology   Anesthesia Other Findings   Reproductive/Obstetrics                         Anesthesia Physical Anesthesia Plan  ASA: II  Anesthesia Plan: General   Post-op Pain Management:    Induction: Intravenous  Airway Management Planned: LMA  Additional Equipment:   Intra-op Plan:   Post-operative Plan: Extubation in OR  Informed Consent: I have reviewed the patients History and Physical, chart, labs and discussed the procedure including the risks, benefits and alternatives for the proposed anesthesia with the patient or authorized representative who has indicated his/her understanding and acceptance.   Dental advisory given and Dental Advisory Given  Plan Discussed with: Anesthesiologist, Surgeon and CRNA  Anesthesia Plan Comments:        Anesthesia Quick Evaluation

## 2012-06-02 ENCOUNTER — Encounter (HOSPITAL_COMMUNITY): Payer: Self-pay | Admitting: Orthopedic Surgery

## 2012-06-05 LAB — TISSUE CULTURE

## 2012-06-06 LAB — ANAEROBIC CULTURE

## 2012-06-13 ENCOUNTER — Ambulatory Visit: Payer: Medicare Other | Attending: Family Medicine | Admitting: Physical Therapy

## 2012-06-13 DIAGNOSIS — S78119A Complete traumatic amputation at level between unspecified hip and knee, initial encounter: Secondary | ICD-10-CM | POA: Diagnosis not present

## 2012-06-13 DIAGNOSIS — IMO0001 Reserved for inherently not codable concepts without codable children: Secondary | ICD-10-CM | POA: Diagnosis not present

## 2012-06-13 DIAGNOSIS — R262 Difficulty in walking, not elsewhere classified: Secondary | ICD-10-CM | POA: Diagnosis not present

## 2012-06-27 DIAGNOSIS — Z1211 Encounter for screening for malignant neoplasm of colon: Secondary | ICD-10-CM | POA: Diagnosis not present

## 2012-07-14 DIAGNOSIS — Z01419 Encounter for gynecological examination (general) (routine) without abnormal findings: Secondary | ICD-10-CM | POA: Diagnosis not present

## 2012-08-01 DIAGNOSIS — J069 Acute upper respiratory infection, unspecified: Secondary | ICD-10-CM | POA: Diagnosis not present

## 2012-08-29 ENCOUNTER — Other Ambulatory Visit (HOSPITAL_COMMUNITY): Payer: Self-pay | Admitting: Orthopedic Surgery

## 2012-09-02 ENCOUNTER — Encounter (HOSPITAL_COMMUNITY): Payer: Self-pay | Admitting: Pharmacy Technician

## 2012-09-13 ENCOUNTER — Encounter (HOSPITAL_COMMUNITY): Payer: Self-pay

## 2012-09-13 MED ORDER — CEFAZOLIN SODIUM-DEXTROSE 2-3 GM-% IV SOLR
2.0000 g | INTRAVENOUS | Status: AC
  Administered 2012-09-14: 2 g via INTRAVENOUS
  Filled 2012-09-13: qty 50

## 2012-09-14 ENCOUNTER — Encounter (HOSPITAL_COMMUNITY)
Admission: RE | Disposition: A | Discharge status: Home / Self Care | Payer: Self-pay | Source: Ambulatory Visit | Attending: Orthopedic Surgery

## 2012-09-14 ENCOUNTER — Encounter (HOSPITAL_COMMUNITY): Payer: Self-pay | Admitting: Anesthesiology

## 2012-09-14 ENCOUNTER — Inpatient Hospital Stay (HOSPITAL_COMMUNITY)
Admission: RE | Admit: 2012-09-14 | Discharge: 2012-09-14 | DRG: 475 | Disposition: A | Discharge status: Home / Self Care | Payer: Medicare Other | Source: Ambulatory Visit | Attending: Orthopedic Surgery | Admitting: Orthopedic Surgery

## 2012-09-14 ENCOUNTER — Ambulatory Visit (HOSPITAL_COMMUNITY): Payer: Medicare Other | Admitting: Anesthesiology

## 2012-09-14 DIAGNOSIS — F17200 Nicotine dependence, unspecified, uncomplicated: Secondary | ICD-10-CM | POA: Diagnosis present

## 2012-09-14 DIAGNOSIS — L03119 Cellulitis of unspecified part of limb: Secondary | ICD-10-CM | POA: Diagnosis not present

## 2012-09-14 DIAGNOSIS — E785 Hyperlipidemia, unspecified: Secondary | ICD-10-CM | POA: Diagnosis not present

## 2012-09-14 DIAGNOSIS — T8789 Other complications of amputation stump: Secondary | ICD-10-CM | POA: Diagnosis not present

## 2012-09-14 DIAGNOSIS — M86659 Other chronic osteomyelitis, unspecified thigh: Secondary | ICD-10-CM | POA: Diagnosis not present

## 2012-09-14 DIAGNOSIS — L02419 Cutaneous abscess of limb, unspecified: Secondary | ICD-10-CM | POA: Diagnosis present

## 2012-09-14 DIAGNOSIS — T8740 Infection of amputation stump, unspecified extremity: Secondary | ICD-10-CM | POA: Diagnosis not present

## 2012-09-14 DIAGNOSIS — Y835 Amputation of limb(s) as the cause of abnormal reaction of the patient, or of later complication, without mention of misadventure at the time of the procedure: Secondary | ICD-10-CM | POA: Diagnosis present

## 2012-09-14 DIAGNOSIS — M86669 Other chronic osteomyelitis, unspecified tibia and fibula: Secondary | ICD-10-CM | POA: Diagnosis not present

## 2012-09-14 DIAGNOSIS — S78119A Complete traumatic amputation at level between unspecified hip and knee, initial encounter: Secondary | ICD-10-CM

## 2012-09-14 DIAGNOSIS — F411 Generalized anxiety disorder: Secondary | ICD-10-CM | POA: Diagnosis present

## 2012-09-14 DIAGNOSIS — K219 Gastro-esophageal reflux disease without esophagitis: Secondary | ICD-10-CM | POA: Diagnosis present

## 2012-09-14 DIAGNOSIS — Y92009 Unspecified place in unspecified non-institutional (private) residence as the place of occurrence of the external cause: Secondary | ICD-10-CM

## 2012-09-14 DIAGNOSIS — M86459 Chronic osteomyelitis with draining sinus, unspecified femur: Secondary | ICD-10-CM | POA: Diagnosis present

## 2012-09-14 HISTORY — PX: AMPUTATION: SHX166

## 2012-09-14 LAB — CBC
MCH: 28.5 pg (ref 26.0–34.0)
Platelets: 295 10*3/uL (ref 150–400)
RBC: 4.84 MIL/uL (ref 3.87–5.11)
RDW: 13.6 % (ref 11.5–15.5)
WBC: 6.5 10*3/uL (ref 4.0–10.5)

## 2012-09-14 LAB — COMPREHENSIVE METABOLIC PANEL
ALT: 6 U/L (ref 0–35)
AST: 11 U/L (ref 0–37)
Albumin: 3.7 g/dL (ref 3.5–5.2)
CO2: 27 mEq/L (ref 19–32)
Calcium: 9.3 mg/dL (ref 8.4–10.5)
Chloride: 104 mEq/L (ref 96–112)
Creatinine, Ser: 0.54 mg/dL (ref 0.50–1.10)
GFR calc non Af Amer: >90 mL/min (ref >90)
Sodium: 140 mEq/L (ref 135–145)

## 2012-09-14 LAB — SURGICAL PCR SCREEN: MRSA, PCR: NEGATIVE

## 2012-09-14 LAB — PROTIME-INR: INR: 1.01 (ref 0.00–1.49)

## 2012-09-14 SURGERY — AMPUTATION, ABOVE KNEE
Anesthesia: General | Site: Leg Upper | Laterality: Left | Wound class: Contaminated

## 2012-09-14 MED ORDER — EPHEDRINE SULFATE 50 MG/ML IJ SOLN
INTRAMUSCULAR | Status: DC | PRN
  Administered 2012-09-14: 20 mg via INTRAVENOUS

## 2012-09-14 MED ORDER — LIDOCAINE HCL (CARDIAC) 20 MG/ML IV SOLN
INTRAVENOUS | Status: DC | PRN
  Administered 2012-09-14: 80 mg via INTRAVENOUS

## 2012-09-14 MED ORDER — PROPOFOL 10 MG/ML IV BOLUS
INTRAVENOUS | Status: DC | PRN
  Administered 2012-09-14: 260 mg via INTRAVENOUS

## 2012-09-14 MED ORDER — MUPIROCIN 2 % EX OINT
TOPICAL_OINTMENT | Freq: Two times a day (BID) | CUTANEOUS | Status: DC
  Administered 2012-09-14: 1 via NASAL
  Filled 2012-09-14: qty 22

## 2012-09-14 MED ORDER — OXYCODONE HCL 5 MG/5ML PO SOLN: 5.0000 mg | Freq: Once | ORAL | Status: DC | PRN

## 2012-09-14 MED ORDER — HYDROMORPHONE HCL PF 1 MG/ML IJ SOLN
0.2500 mg | INTRAMUSCULAR | Status: DC | PRN
  Administered 2012-09-14 (×2): 0.5 mg via INTRAVENOUS

## 2012-09-14 MED ORDER — GENTAMICIN SULFATE 40 MG/ML IJ SOLN
INTRAMUSCULAR | Status: AC
  Filled 2012-09-14: qty 4

## 2012-09-14 MED ORDER — LACTATED RINGERS IV SOLN
INTRAVENOUS | Status: DC | PRN
  Administered 2012-09-14 (×2): via INTRAVENOUS

## 2012-09-14 MED ORDER — OXYCODONE HCL 5 MG PO TABS
5.0000 mg | ORAL_TABLET | Freq: Once | ORAL | Status: DC | PRN
Start: 2012-09-14 — End: 2012-09-14

## 2012-09-14 MED ORDER — ONDANSETRON HCL 4 MG/2ML IJ SOLN: 4.0000 mg | Freq: Four times a day (QID) | INTRAMUSCULAR | Status: DC | PRN

## 2012-09-14 MED ORDER — VANCOMYCIN HCL 500 MG IV SOLR
INTRAVENOUS | Status: DC | PRN
  Administered 2012-09-14: 500 mg

## 2012-09-14 MED ORDER — ONDANSETRON HCL 4 MG/2ML IJ SOLN
INTRAMUSCULAR | Status: DC | PRN
  Administered 2012-09-14: 4 mg via INTRAVENOUS

## 2012-09-14 MED ORDER — FENTANYL CITRATE 0.05 MG/ML IJ SOLN
INTRAMUSCULAR | Status: DC | PRN
  Administered 2012-09-14 (×2): 25 ug via INTRAVENOUS

## 2012-09-14 MED ORDER — VANCOMYCIN HCL 500 MG IV SOLR
INTRAVENOUS | Status: AC
  Filled 2012-09-14: qty 500

## 2012-09-14 MED ORDER — 0.9 % SODIUM CHLORIDE (POUR BTL) OPTIME
TOPICAL | Status: DC | PRN
  Administered 2012-09-14: 1000 mL

## 2012-09-14 MED ORDER — GENTAMICIN SULFATE 40 MG/ML IJ SOLN
INTRAMUSCULAR | Status: DC | PRN
  Administered 2012-09-14: 160 mg

## 2012-09-14 MED ORDER — HYDROMORPHONE HCL PF 1 MG/ML IJ SOLN
INTRAMUSCULAR | Status: AC
  Filled 2012-09-14: qty 1

## 2012-09-14 MED ORDER — MUPIROCIN 2 % EX OINT
TOPICAL_OINTMENT | CUTANEOUS | Status: AC
  Filled 2012-09-14: qty 22

## 2012-09-14 SURGICAL SUPPLY — 51 items
BANDAGE ESMARK 6X9 LF (GAUZE/BANDAGES/DRESSINGS) ×1 IMPLANT
BANDAGE GAUZE ELAST BULKY 4 IN (GAUZE/BANDAGES/DRESSINGS) ×2 IMPLANT
BLADE SAW RECIP 87.9 MT (BLADE) ×2 IMPLANT
BNDG CMPR 9X6 STRL LF SNTH (GAUZE/BANDAGES/DRESSINGS)
BNDG COHESIVE 6X5 TAN STRL LF (GAUZE/BANDAGES/DRESSINGS) ×3 IMPLANT
BNDG ESMARK 6X9 LF (GAUZE/BANDAGES/DRESSINGS)
BNDG GAUZE STRTCH 6 (GAUZE/BANDAGES/DRESSINGS) IMPLANT
CLOTH BEACON ORANGE TIMEOUT ST (SAFETY) ×2 IMPLANT
COVER SURGICAL LIGHT HANDLE (MISCELLANEOUS) ×2 IMPLANT
CUFF TOURNIQUET SINGLE 34IN LL (TOURNIQUET CUFF) IMPLANT
CUFF TOURNIQUET SINGLE 44IN (TOURNIQUET CUFF) IMPLANT
DRAIN PENROSE 1/2X12 LTX STRL (WOUND CARE) IMPLANT
DRAPE EXTREMITY T 121X128X90 (DRAPE) ×2 IMPLANT
DRAPE PROXIMA HALF (DRAPES) ×4 IMPLANT
DRAPE U-SHAPE 47X51 STRL (DRAPES) ×4 IMPLANT
DRSG ADAPTIC 3X8 NADH LF (GAUZE/BANDAGES/DRESSINGS) ×2 IMPLANT
DRSG PAD ABDOMINAL 8X10 ST (GAUZE/BANDAGES/DRESSINGS) ×4 IMPLANT
DURAPREP 26ML APPLICATOR (WOUND CARE) ×2 IMPLANT
ELECT CAUTERY BLADE 6.4 (BLADE) IMPLANT
ELECT REM PT RETURN 9FT ADLT (ELECTROSURGICAL) ×2
ELECTRODE REM PT RTRN 9FT ADLT (ELECTROSURGICAL) ×1 IMPLANT
EVACUATOR 1/8 PVC DRAIN (DRAIN) IMPLANT
GLOVE BIOGEL PI IND STRL 9 (GLOVE) ×1 IMPLANT
GLOVE BIOGEL PI INDICATOR 9 (GLOVE) ×1
GLOVE SURG ORTHO 9.0 STRL STRW (GLOVE) ×2 IMPLANT
GOWN PREVENTION PLUS XLARGE (GOWN DISPOSABLE) ×2 IMPLANT
GOWN STRL REIN XL XLG (GOWN DISPOSABLE) ×2 IMPLANT
KIT BASIN OR (CUSTOM PROCEDURE TRAY) ×2 IMPLANT
KIT ROOM TURNOVER OR (KITS) ×2 IMPLANT
KIT STIMULAN 5CC (Orthopedic Implant) ×1 IMPLANT
MANIFOLD NEPTUNE II (INSTRUMENTS) ×2 IMPLANT
NS IRRIG 1000ML POUR BTL (IV SOLUTION) ×2 IMPLANT
PACK GENERAL/GYN (CUSTOM PROCEDURE TRAY) ×2 IMPLANT
PAD ARMBOARD 7.5X6 YLW CONV (MISCELLANEOUS) ×4 IMPLANT
PAD CAST 4YDX4 CTTN HI CHSV (CAST SUPPLIES) ×1 IMPLANT
PADDING CAST COTTON 4X4 STRL (CAST SUPPLIES) ×2
PADDING CAST COTTON 6X4 STRL (CAST SUPPLIES) ×2 IMPLANT
SPONGE GAUZE 4X4 12PLY (GAUZE/BANDAGES/DRESSINGS) ×2 IMPLANT
SPONGE LAP 18X18 X RAY DECT (DISPOSABLE) IMPLANT
STAPLER VISISTAT 35W (STAPLE) IMPLANT
STOCKINETTE IMPERVIOUS LG (DRAPES) IMPLANT
SUT ETHILON 2 0 PSLX (SUTURE) ×2 IMPLANT
SUT PDS AB 1 CT  36 (SUTURE)
SUT PDS AB 1 CT 36 (SUTURE) IMPLANT
SUT SILK 2 0 (SUTURE) ×2
SUT SILK 2-0 18XBRD TIE 12 (SUTURE) ×1 IMPLANT
SWAB COLLECTION DEVICE MRSA (MISCELLANEOUS) IMPLANT
TOWEL OR 17X24 6PK STRL BLUE (TOWEL DISPOSABLE) ×2 IMPLANT
TOWEL OR 17X26 10 PK STRL BLUE (TOWEL DISPOSABLE) ×2 IMPLANT
TUBE ANAEROBIC SPECIMEN COL (MISCELLANEOUS) IMPLANT
WATER STERILE IRR 1000ML POUR (IV SOLUTION) ×1 IMPLANT

## 2012-09-14 NOTE — Transfer of Care (Signed)
Immediate Anesthesia Transfer of Care Note  Patient: Rachel Mcintosh  Procedure(s) Performed: Procedure(s) (LRB) with comments: AMPUTATION ABOVE KNEE (Left) - Left Above Knee Amputation Revision  Patient Location: PACU  Anesthesia Type:General  Level of Consciousness: awake, alert , oriented and patient cooperative  Airway & Oxygen Therapy: Patient Spontanous Breathing and Patient connected to nasal cannula oxygen  Post-op Assessment: Report given to PACU RN, Post -op Vital signs reviewed and stable and Patient moving all extremities  Post vital signs: Reviewed and stable  Complications: No apparent anesthesia complications

## 2012-09-14 NOTE — Op Note (Signed)
OPERATIVE REPORT  DATE OF SURGERY: 09/14/2012  PATIENT:  Rachel Mcintosh,  57 y.o. female  PRE-OPERATIVE DIAGNOSIS:  Abscess Left Above Knee Amputation  POST-OPERATIVE DIAGNOSIS:  Abscess Left Above Knee Amputation  PROCEDURE:  Procedure(s): AMPUTATION ABOVE KNEE Revision with resection of bone and soft tissue. Placement of antibiotic beads with 500 mg vancomycin and 160 mg gentamicin  SURGEON:  Surgeon(s): Nadara Mustard, MD  ANESTHESIA:   general  EBL:  Minimal ML  SPECIMEN:  No Specimen  TOURNIQUET:  * No tourniquets in log *  PROCEDURE DETAILS: Patient is a 57 year old woman who is status post traumatic above-the-knee amputation on the left. She had developed a chronic osteomyelitis with a deep abscess she previous is undergone weeks section of the infected bone and debridement of the abscess and presents at this time with recurrent draining sinus tract. Do to the recurrence of infection patient presents at this time for revision   transfemoral amputation. Risks and benefits were discussed including need for additional surgery. Patient states she understands was pursued this time. Description of procedure patient brought the operating room and underwent a general anesthetic. After adequate levels and anesthesia obtained patient's left lower extremity was prepped using DuraPrep and draped into a sterile field. A fishmouth incision was made around the draining sinus tract this extended all the way down to bone and also had a large soft tissue pocket of abscess. The abscess pocket was excised the distal 2 cm of the femur were excised. The wound is irrigated normal saline there is no signs of any nonviable tissue no signs of any abscess at this time after debridement. Antibiotic beads were made with the stimulant beads. 500 mg vancomycin 160 mg of gentamicin. These were placed deep within the wound. The incision was closed using 2-0 nylon. The wound was covered with Adaptic orthopedic sponges  AB dressing Kerlix Coban and Hypafix tape. Patient was extubated taken to the PACU in stable condition.  PLAN OF CARE: Discharge to home after PACU  PATIENT DISPOSITION:  PACU - hemodynamically stable.   Nadara Mustard, MD 09/14/2012 9:14 AM

## 2012-09-14 NOTE — Preoperative (Signed)
Beta Blockers   Reason not to administer Beta Blockers:Not Applicable 

## 2012-09-14 NOTE — Anesthesia Preprocedure Evaluation (Addendum)
Anesthesia Evaluation  Patient identified by MRN, date of birth, ID band Patient awake    Reviewed: Allergy & Precautions, H&P , NPO status , Patient's Chart, lab work & pertinent test results, reviewed documented beta blocker date and time   Airway Mallampati: II  Neck ROM: full    Dental  (+) Dental Advisory Given and Teeth Intact   Pulmonary Current Smoker,          Cardiovascular     Neuro/Psych Anxiety  Neuromuscular disease    GI/Hepatic GERD-  ,  Endo/Other    Renal/GU      Musculoskeletal   Abdominal   Peds  Hematology   Anesthesia Other Findings   Reproductive/Obstetrics                          Anesthesia Physical Anesthesia Plan  ASA: II  Anesthesia Plan: General   Post-op Pain Management:    Induction: Intravenous  Airway Management Planned: LMA  Additional Equipment:   Intra-op Plan:   Post-operative Plan:   Informed Consent: I have reviewed the patients History and Physical, chart, labs and discussed the procedure including the risks, benefits and alternatives for the proposed anesthesia with the patient or authorized representative who has indicated his/her understanding and acceptance.     Plan Discussed with: CRNA and Surgeon  Anesthesia Plan Comments:         Anesthesia Quick Evaluation

## 2012-09-14 NOTE — Anesthesia Postprocedure Evaluation (Signed)
Anesthesia Post Note  Patient: Rachel Mcintosh  Procedure(s) Performed: Procedure(s) (LRB): AMPUTATION ABOVE KNEE (Left)  Anesthesia type: General  Patient location: PACU  Post pain: Pain level controlled and Adequate analgesia  Post assessment: Post-op Vital signs reviewed, Patient's Cardiovascular Status Stable, Respiratory Function Stable, Patent Airway and Pain level controlled  Last Vitals:  Filed Vitals:   09/14/12 0947  BP: 115/71  Pulse: 71  Temp:   Resp: 19    Post vital signs: Reviewed and stable  Level of consciousness: awake, alert  and oriented  Complications: No apparent anesthesia complications

## 2012-09-14 NOTE — Anesthesia Procedure Notes (Signed)
Procedure Name: LMA Insertion Date/Time: 09/14/2012 8:35 AM Performed by: Jerilee Hoh Pre-anesthesia Checklist: Patient identified, Emergency Drugs available, Suction available and Patient being monitored Patient Re-evaluated:Patient Re-evaluated prior to inductionOxygen Delivery Method: Circle system utilized Preoxygenation: Pre-oxygenation with 100% oxygen Intubation Type: IV induction Ventilation: Mask ventilation without difficulty LMA: LMA inserted LMA Size: 4.0 Tube type: Oral Number of attempts: 1 Placement Confirmation: positive ETCO2 and breath sounds checked- equal and bilateral Tube secured with: Tape Dental Injury: Teeth and Oropharynx as per pre-operative assessment

## 2012-09-14 NOTE — H&P (Addendum)
Rachel Mcintosh is an 57 y.o. female.   Chief Complaint: Chronic osteomyelitis left above-the-knee amputation HPI: Patient is a 57 year old woman who is status post above-the-knee amputation for  trauma.. Patient has undergone previous debridement of the infected bone as well as debridement of an abscess and presents at this time with recurrent abscess..  Past Medical History  Diagnosis Date  . Family history of anesthesia complication     mother has difficulty waking up after anesthesia  . Hyperlipidemia     controlled by diet  . Anxiety     on medication for  . Bronchitis     hx of  . Urination disorder     "difficulty holding urine"  . GERD (gastroesophageal reflux disease)     "tums" or generic brand of prilosec if needed"  . Cervical spine fracture     hx of   . Degenerative disc disease     low lumbar area  . Neuromuscular disorder     hx of right wrist, no surgery    Past Surgical History  Procedure Date  . Fracture surgery     cervical  . Back surgery     lumbar  . Leg amputation above knee     left leg  . Rotator cuff repair     right shoulder  . Arm surgery     rod in lower left arm  . Abdominal hysterectomy     partial  . Ankle surgery     rod and screws to right ankle  . Dilation and curettage of uterus   . Osteochondroma excision 06/01/2012    Procedure: OSTEOCHONDROMA EXCISION;  Surgeon: Nadara Mustard, MD;  Location: [facility] OR;  Service: Orthopedics;  Laterality: Left;  Excision of Bone Spur Left Above Knee Amputation    History reviewed. No pertinent family history. Social History:  reports that she has been smoking.  She does not have any smokeless tobacco history on file. She reports that she drinks alcohol. She reports that she does not use illicit drugs.  Allergies:  Allergies  Allergen Reactions  . Codeine Nausea And Vomiting  . Sulfa Antibiotics Other (See Comments)    Unknown    Medications Prior to Admission  Medication Sig Dispense Refill    . doxycycline (VIBRA-TABS) 100 MG tablet Take 100 mg by mouth 2 (two) times daily. Patient started medication on 08/02/12. Only take for 30 days, patient states she has missed a couple doses here and there.      Marland Kitchen HYDROcodone-acetaminophen (VICODIN) 5-500 MG per tablet Take 1 tablet by mouth every 6 (six) hours as needed for pain.  30 tablet  0  . Multiple Vitamins-Calcium (ONE-A-DAY WOMENS PO) Take 1 tablet by mouth daily.      Marland Kitchen PARoxetine (PAXIL) 30 MG tablet Take 30 mg by mouth every morning.        No results found for this or any previous visit (from the past 48 hour(s)). No results found.  Review of Systems  All other systems reviewed and are negative.    Height 5\' 6"  (1.676 m), weight 62.596 kg (138 lb). Physical Exam  On examination patient has recurrent abscess draining from the left transtibial amputation. The skin shows no cellulitis no induration but she does have a chronic sinus draining tract.. Assessment/Plan Assessment: Recurrent abscess with chronic osteomyelitis left above-the-knee traumatic amputation..  Plan: We'll plan for revision amputation and placement of antibiotic beads.. Risks and benefits were discussed including infection neurovascular injury nonhealing  of the wound need for higher level amputation. Patient states she understands and wished to proceed at this time.  DUDA,MARCUS V 09/14/2012, 6:39 AM

## 2012-09-16 ENCOUNTER — Encounter (HOSPITAL_COMMUNITY): Payer: Self-pay | Admitting: Orthopedic Surgery

## 2012-09-21 NOTE — Discharge Summary (Signed)
  Patient underwent revision left above-the-knee amputation and was discharged to home in stable condition.

## 2012-11-30 ENCOUNTER — Other Ambulatory Visit (HOSPITAL_COMMUNITY): Payer: Self-pay | Admitting: Orthopedic Surgery

## 2012-12-01 ENCOUNTER — Encounter (HOSPITAL_COMMUNITY): Payer: Self-pay | Admitting: *Deleted

## 2012-12-01 MED ORDER — CEFAZOLIN SODIUM-DEXTROSE 2-3 GM-% IV SOLR
2.0000 g | INTRAVENOUS | Status: AC
  Administered 2012-12-02: 2 g via INTRAVENOUS
  Filled 2012-12-01: qty 50

## 2012-12-02 ENCOUNTER — Encounter (HOSPITAL_COMMUNITY): Payer: Self-pay | Admitting: Anesthesiology

## 2012-12-02 ENCOUNTER — Ambulatory Visit (HOSPITAL_COMMUNITY)
Admission: RE | Admit: 2012-12-02 | Discharge: 2012-12-02 | Disposition: A | Discharge status: Home / Self Care | Payer: Medicare Other | Source: Ambulatory Visit | Attending: Orthopedic Surgery | Admitting: Orthopedic Surgery

## 2012-12-02 ENCOUNTER — Ambulatory Visit (HOSPITAL_COMMUNITY): Payer: Medicare Other | Admitting: Anesthesiology

## 2012-12-02 ENCOUNTER — Encounter (HOSPITAL_COMMUNITY)
Admission: RE | Disposition: A | Discharge status: Home / Self Care | Payer: Self-pay | Source: Ambulatory Visit | Attending: Orthopedic Surgery

## 2012-12-02 DIAGNOSIS — L03119 Cellulitis of unspecified part of limb: Secondary | ICD-10-CM | POA: Diagnosis not present

## 2012-12-02 DIAGNOSIS — Z882 Allergy status to sulfonamides status: Secondary | ICD-10-CM | POA: Insufficient documentation

## 2012-12-02 DIAGNOSIS — Z885 Allergy status to narcotic agent status: Secondary | ICD-10-CM | POA: Insufficient documentation

## 2012-12-02 DIAGNOSIS — F411 Generalized anxiety disorder: Secondary | ICD-10-CM | POA: Insufficient documentation

## 2012-12-02 DIAGNOSIS — T8740 Infection of amputation stump, unspecified extremity: Secondary | ICD-10-CM | POA: Diagnosis not present

## 2012-12-02 DIAGNOSIS — J40 Bronchitis, not specified as acute or chronic: Secondary | ICD-10-CM | POA: Insufficient documentation

## 2012-12-02 DIAGNOSIS — M51379 Other intervertebral disc degeneration, lumbosacral region without mention of lumbar back pain or lower extremity pain: Secondary | ICD-10-CM | POA: Insufficient documentation

## 2012-12-02 DIAGNOSIS — M86659 Other chronic osteomyelitis, unspecified thigh: Secondary | ICD-10-CM | POA: Insufficient documentation

## 2012-12-02 DIAGNOSIS — L02419 Cutaneous abscess of limb, unspecified: Secondary | ICD-10-CM | POA: Diagnosis not present

## 2012-12-02 DIAGNOSIS — E785 Hyperlipidemia, unspecified: Secondary | ICD-10-CM | POA: Diagnosis not present

## 2012-12-02 DIAGNOSIS — F17200 Nicotine dependence, unspecified, uncomplicated: Secondary | ICD-10-CM | POA: Insufficient documentation

## 2012-12-02 DIAGNOSIS — G709 Myoneural disorder, unspecified: Secondary | ICD-10-CM | POA: Insufficient documentation

## 2012-12-02 DIAGNOSIS — K219 Gastro-esophageal reflux disease without esophagitis: Secondary | ICD-10-CM | POA: Insufficient documentation

## 2012-12-02 DIAGNOSIS — M86452 Chronic osteomyelitis with draining sinus, left femur: Secondary | ICD-10-CM

## 2012-12-02 DIAGNOSIS — M5137 Other intervertebral disc degeneration, lumbosacral region: Secondary | ICD-10-CM | POA: Insufficient documentation

## 2012-12-02 DIAGNOSIS — Y836 Removal of other organ (partial) (total) as the cause of abnormal reaction of the patient, or of later complication, without mention of misadventure at the time of the procedure: Secondary | ICD-10-CM | POA: Insufficient documentation

## 2012-12-02 HISTORY — PX: AMPUTATION: SHX166

## 2012-12-02 LAB — CBC
HCT: 41.8 % (ref 36.0–46.0)
Hemoglobin: 14.5 g/dL (ref 12.0–15.0)
RDW: 13.7 % (ref 11.5–15.5)
WBC: 6.7 10*3/uL (ref 4.0–10.5)

## 2012-12-02 LAB — PROTIME-INR: Prothrombin Time: 13.4 seconds (ref 11.6–15.2)

## 2012-12-02 LAB — COMPREHENSIVE METABOLIC PANEL
ALT: 8 U/L (ref 0–35)
Albumin: 4.2 g/dL (ref 3.5–5.2)
Alkaline Phosphatase: 80 U/L (ref 39–117)
BUN: 9 mg/dL (ref 6–23)
Chloride: 104 mEq/L (ref 96–112)
Glucose, Bld: 86 mg/dL (ref 70–99)
Potassium: 4 mEq/L (ref 3.5–5.1)
Sodium: 141 mEq/L (ref 135–145)
Total Bilirubin: 0.4 mg/dL (ref 0.3–1.2)

## 2012-12-02 LAB — APTT: aPTT: 31 seconds (ref 24–37)

## 2012-12-02 LAB — SURGICAL PCR SCREEN: Staphylococcus aureus: NEGATIVE

## 2012-12-02 SURGERY — AMPUTATION, ABOVE KNEE
Anesthesia: General | Site: Leg Upper | Laterality: Left | Wound class: Clean

## 2012-12-02 MED ORDER — PROPOFOL 10 MG/ML IV BOLUS
INTRAVENOUS | Status: DC | PRN
  Administered 2012-12-02: 200 mg via INTRAVENOUS

## 2012-12-02 MED ORDER — MUPIROCIN 2 % EX OINT: TOPICAL_OINTMENT | Freq: Two times a day (BID) | CUTANEOUS | Status: DC

## 2012-12-02 MED ORDER — HYDROMORPHONE HCL PF 1 MG/ML IJ SOLN
INTRAMUSCULAR | Status: AC
  Filled 2012-12-02: qty 1

## 2012-12-02 MED ORDER — ARTIFICIAL TEARS OP OINT
TOPICAL_OINTMENT | OPHTHALMIC | Status: DC | PRN
  Administered 2012-12-02: 1 via OPHTHALMIC

## 2012-12-02 MED ORDER — LACTATED RINGERS IV SOLN
INTRAVENOUS | Status: DC | PRN
  Administered 2012-12-02: 18:00:00 via INTRAVENOUS

## 2012-12-02 MED ORDER — VANCOMYCIN HCL 1000 MG IV SOLR
INTRAVENOUS | Status: DC | PRN
  Administered 2012-12-02: 1000 mg via TOPICAL

## 2012-12-02 MED ORDER — MIDAZOLAM HCL 5 MG/5ML IJ SOLN
INTRAMUSCULAR | Status: DC | PRN
  Administered 2012-12-02 (×2): 1 mg via INTRAVENOUS

## 2012-12-02 MED ORDER — OXYCODONE HCL 5 MG/5ML PO SOLN: 5.0000 mg | Freq: Once | ORAL | Status: AC | PRN

## 2012-12-02 MED ORDER — LACTATED RINGERS IV SOLN
INTRAVENOUS | Status: DC
  Administered 2012-12-02: 18:00:00 via INTRAVENOUS

## 2012-12-02 MED ORDER — FENTANYL CITRATE 0.05 MG/ML IJ SOLN
INTRAMUSCULAR | Status: DC | PRN
  Administered 2012-12-02 (×2): 50 ug via INTRAVENOUS

## 2012-12-02 MED ORDER — VANCOMYCIN HCL 1000 MG IV SOLR
INTRAVENOUS | Status: AC
  Filled 2012-12-02: qty 1000

## 2012-12-02 MED ORDER — OXYCODONE-ACETAMINOPHEN 5-325 MG PO TABS
1.0000 | ORAL_TABLET | ORAL | Status: DC | PRN
Start: 2012-12-02 — End: 2013-06-21

## 2012-12-02 MED ORDER — OXYCODONE HCL 5 MG PO TABS
ORAL_TABLET | ORAL | Status: AC
  Filled 2012-12-02: qty 1

## 2012-12-02 MED ORDER — LIDOCAINE HCL (CARDIAC) 20 MG/ML IV SOLN
INTRAVENOUS | Status: DC | PRN
  Administered 2012-12-02: 30 mg via INTRAVENOUS

## 2012-12-02 MED ORDER — MUPIROCIN 2 % EX OINT
TOPICAL_OINTMENT | CUTANEOUS | Status: AC
  Filled 2012-12-02: qty 22

## 2012-12-02 MED ORDER — PROMETHAZINE HCL 25 MG/ML IJ SOLN: 6.2500 mg | INTRAMUSCULAR | Status: DC | PRN

## 2012-12-02 MED ORDER — HYDROCODONE-ACETAMINOPHEN 7.5-325 MG PO TABS: 1.0000 | ORAL_TABLET | ORAL | Status: DC | PRN

## 2012-12-02 MED ORDER — MEPERIDINE HCL 25 MG/ML IJ SOLN: 6.2500 mg | INTRAMUSCULAR | Status: DC | PRN

## 2012-12-02 MED ORDER — GENTAMICIN SULFATE 40 MG/ML IJ SOLN
INTRAMUSCULAR | Status: DC | PRN
  Administered 2012-12-02: 240 mg

## 2012-12-02 MED ORDER — EPHEDRINE SULFATE 50 MG/ML IJ SOLN
INTRAMUSCULAR | Status: DC | PRN
  Administered 2012-12-02: 10 mg via INTRAVENOUS
  Administered 2012-12-02: 5 mg via INTRAVENOUS

## 2012-12-02 MED ORDER — MIDAZOLAM HCL 2 MG/2ML IJ SOLN: 0.5000 mg | Freq: Once | INTRAMUSCULAR | Status: DC | PRN

## 2012-12-02 MED ORDER — GENTAMICIN SULFATE 40 MG/ML IJ SOLN
INTRAMUSCULAR | Status: AC
  Filled 2012-12-02: qty 6

## 2012-12-02 MED ORDER — HYDROMORPHONE HCL PF 1 MG/ML IJ SOLN
0.2500 mg | INTRAMUSCULAR | Status: DC | PRN
  Administered 2012-12-02 (×2): 0.5 mg via INTRAVENOUS

## 2012-12-02 MED ORDER — OXYCODONE HCL 5 MG PO TABS
5.0000 mg | ORAL_TABLET | Freq: Once | ORAL | Status: AC | PRN
  Administered 2012-12-02: 5 mg via ORAL

## 2012-12-02 SURGICAL SUPPLY — 51 items
BANDAGE ESMARK 6X9 LF (GAUZE/BANDAGES/DRESSINGS) ×1 IMPLANT
BANDAGE GAUZE ELAST BULKY 4 IN (GAUZE/BANDAGES/DRESSINGS) ×1 IMPLANT
BLADE SAW RECIP 87.9 MT (BLADE) ×2 IMPLANT
BNDG CMPR 9X6 STRL LF SNTH (GAUZE/BANDAGES/DRESSINGS) ×1
BNDG COHESIVE 6X5 TAN STRL LF (GAUZE/BANDAGES/DRESSINGS) ×3 IMPLANT
BNDG ESMARK 6X9 LF (GAUZE/BANDAGES/DRESSINGS) ×2
BNDG GAUZE STRTCH 6 (GAUZE/BANDAGES/DRESSINGS) IMPLANT
CLOTH BEACON ORANGE TIMEOUT ST (SAFETY) ×2 IMPLANT
COVER SURGICAL LIGHT HANDLE (MISCELLANEOUS) ×2 IMPLANT
CUFF TOURNIQUET SINGLE 34IN LL (TOURNIQUET CUFF) IMPLANT
CUFF TOURNIQUET SINGLE 44IN (TOURNIQUET CUFF) IMPLANT
DRAIN PENROSE 1/2X12 LTX STRL (WOUND CARE) IMPLANT
DRAPE EXTREMITY T 121X128X90 (DRAPE) ×2 IMPLANT
DRAPE PROXIMA HALF (DRAPES) ×4 IMPLANT
DRAPE U-SHAPE 47X51 STRL (DRAPES) ×4 IMPLANT
DRSG ADAPTIC 3X8 NADH LF (GAUZE/BANDAGES/DRESSINGS) ×2 IMPLANT
DRSG PAD ABDOMINAL 8X10 ST (GAUZE/BANDAGES/DRESSINGS) ×1 IMPLANT
DURAPREP 26ML APPLICATOR (WOUND CARE) ×2 IMPLANT
ELECT CAUTERY BLADE 6.4 (BLADE) ×1 IMPLANT
ELECT REM PT RETURN 9FT ADLT (ELECTROSURGICAL) ×2
ELECTRODE REM PT RTRN 9FT ADLT (ELECTROSURGICAL) ×1 IMPLANT
EVACUATOR 1/8 PVC DRAIN (DRAIN) IMPLANT
GLOVE BIOGEL PI IND STRL 9 (GLOVE) ×1 IMPLANT
GLOVE BIOGEL PI INDICATOR 9 (GLOVE) ×1
GLOVE SURG ORTHO 9.0 STRL STRW (GLOVE) ×2 IMPLANT
GOWN PREVENTION PLUS XLARGE (GOWN DISPOSABLE) ×2 IMPLANT
GOWN STRL REIN XL XLG (GOWN DISPOSABLE) ×2 IMPLANT
KIT BASIN OR (CUSTOM PROCEDURE TRAY) ×2 IMPLANT
KIT ROOM TURNOVER OR (KITS) ×2 IMPLANT
KIT STIMULAN RAPID CURE  10CC (Orthopedic Implant) ×1 IMPLANT
KIT STIMULAN RAPID CURE 10CC (Orthopedic Implant) IMPLANT
MANIFOLD NEPTUNE II (INSTRUMENTS) ×2 IMPLANT
NS IRRIG 1000ML POUR BTL (IV SOLUTION) ×2 IMPLANT
PACK GENERAL/GYN (CUSTOM PROCEDURE TRAY) ×2 IMPLANT
PAD ARMBOARD 7.5X6 YLW CONV (MISCELLANEOUS) ×4 IMPLANT
PAD CAST 4YDX4 CTTN HI CHSV (CAST SUPPLIES) ×1 IMPLANT
PADDING CAST COTTON 4X4 STRL (CAST SUPPLIES) ×2
PADDING CAST COTTON 6X4 STRL (CAST SUPPLIES) ×2 IMPLANT
SPONGE GAUZE 4X4 12PLY (GAUZE/BANDAGES/DRESSINGS) ×2 IMPLANT
SPONGE LAP 18X18 X RAY DECT (DISPOSABLE) ×1 IMPLANT
STAPLER VISISTAT 35W (STAPLE) ×1 IMPLANT
STOCKINETTE IMPERVIOUS LG (DRAPES) ×1 IMPLANT
SUT PDS AB 1 CT  36 (SUTURE)
SUT PDS AB 1 CT 36 (SUTURE) IMPLANT
SUT SILK 2 0 (SUTURE) ×2
SUT SILK 2-0 18XBRD TIE 12 (SUTURE) ×1 IMPLANT
SWAB COLLECTION DEVICE MRSA (MISCELLANEOUS) IMPLANT
TOWEL OR 17X24 6PK STRL BLUE (TOWEL DISPOSABLE) ×2 IMPLANT
TOWEL OR 17X26 10 PK STRL BLUE (TOWEL DISPOSABLE) ×2 IMPLANT
TUBE ANAEROBIC SPECIMEN COL (MISCELLANEOUS) IMPLANT
WATER STERILE IRR 1000ML POUR (IV SOLUTION) ×2 IMPLANT

## 2012-12-02 NOTE — Anesthesia Preprocedure Evaluation (Addendum)
Anesthesia Evaluation  Patient identified by MRN, date of birth, ID band Patient awake    Reviewed: Allergy & Precautions, H&P , NPO status , Patient's Chart, lab work & pertinent test results  History of Anesthesia Complications (+) Family history of anesthesia reactionNegative for: history of anesthetic complications  Airway Mallampati: II TM Distance: >3 FB Neck ROM: Full    Dental  (+) Teeth Intact and Dental Advisory Given   Pulmonary Current Smoker,  breath sounds clear to auscultation  Pulmonary exam normal       Cardiovascular negative cardio ROS  Rhythm:Regular Rate:Normal     Neuro/Psych Anxiety negative neurological ROS     GI/Hepatic Neg liver ROS, GERD-  Controlled,  Endo/Other  negative endocrine ROS  Renal/GU negative Renal ROS     Musculoskeletal   Abdominal   Peds  Hematology negative hematology ROS (+)   Anesthesia Other Findings   Reproductive/Obstetrics                          Anesthesia Physical Anesthesia Plan  ASA: II  Anesthesia Plan: General   Post-op Pain Management:    Induction: Intravenous  Airway Management Planned: LMA  Additional Equipment:   Intra-op Plan:   Post-operative Plan:   Informed Consent: I have reviewed the patients History and Physical, chart, labs and discussed the procedure including the risks, benefits and alternatives for the proposed anesthesia with the patient or authorized representative who has indicated his/her understanding and acceptance.   Dental advisory given  Plan Discussed with: CRNA and Surgeon  Anesthesia Plan Comments: (Plan routine monitors, GA- LMA OK)        Anesthesia Quick Evaluation

## 2012-12-02 NOTE — Anesthesia Postprocedure Evaluation (Signed)
  Anesthesia Post-op Note  Patient: Rachel Mcintosh  Procedure(s) Performed: Procedure(s) with comments: Left Above Knee Amputation Revision, Place antibiotic beads (Left) - Left Above Knee Amputation Revision, Place antibiotic beads  Patient Location: PACU  Anesthesia Type:General  Level of Consciousness: awake, alert , oriented and patient cooperative  Airway and Oxygen Therapy: Patient Spontanous Breathing  Post-op Pain: mild  Post-op Assessment: Post-op Vital signs reviewed, Patient's Cardiovascular Status Stable, Respiratory Function Stable, Patent Airway, No signs of Nausea or vomiting and Pain level controlled  Post-op Vital Signs: Reviewed and stable  Complications: No apparent anesthesia complications

## 2012-12-02 NOTE — Op Note (Signed)
OPERATIVE REPORT  DATE OF SURGERY: 12/02/2012  PATIENT:  Rachel Mcintosh,  57 y.o. female  PRE-OPERATIVE DIAGNOSIS:  Chronic Recurrent Infection Left Above Knee Amputation  POST-OPERATIVE DIAGNOSIS:  Chronic Recurrent Infection Left Above Knee Amputation  PROCEDURE:  Procedure(s): Left Above Knee Amputation Revision, Place antibiotic beads  SURGEON:  Surgeon(s): Nadara Mustard, MD  ANESTHESIA:   general  EBL:  min ML  SPECIMEN:  No Specimen  TOURNIQUET:  * No tourniquets in log *  PROCEDURE DETAILS: Patient is a 57 year old woman who has had chronic osteomyelitis abscess left above-the-knee amputation. She has undergone multiple revision amputations and presents at this time with recurrent infection. Patient has purulent drainage and requires repeat revision amputation with recurrent osteomyelitis recurrent abscess. Description of procedure patient was brought to the operating room and underwent a general anesthetic. After adequate levels of anesthesia were obtained patient's left lower extremity was prepped using DuraPrep and draped into a sterile field. The draining sinus tract as well as the sinus cavity was resected in one block of tissue. The distal 2 cm of the femur was resected. There was good bleeding along the residual aspect of the femur. The wound is irrigated with normal saline. Antibiotic beads were placed with 10 mg stimulant beads with 1 g vancomycin and 240 mg gentamicin. The incision was closed using 2-0 nylon. The wound was covered with Adaptic orthopedic sponges AB dressing and Hypafix tape. Patient was extubated taken to the PACU in stable condition plan for discharge to home.  PLAN OF CARE: Discharge to home after PACU  PATIENT DISPOSITION:  PACU - hemodynamically stable.   Nadara Mustard, MD 12/02/2012 7:13 PM

## 2012-12-02 NOTE — Anesthesia Procedure Notes (Signed)
Procedure Name: LMA Insertion Date/Time: 12/02/2012 6:39 PM Performed by: Gayla Medicus Pre-anesthesia Checklist: Patient identified, Emergency Drugs available, Patient being monitored, Suction available and Timeout performed Patient Re-evaluated:Patient Re-evaluated prior to inductionOxygen Delivery Method: Circle system utilized Preoxygenation: Pre-oxygenation with 100% oxygen Intubation Type: IV induction LMA: LMA inserted LMA Size: 4.0 Number of attempts: 1 Placement Confirmation: positive ETCO2 and breath sounds checked- equal and bilateral Tube secured with: Tape Dental Injury: Teeth and Oropharynx as per pre-operative assessment

## 2012-12-02 NOTE — Transfer of Care (Signed)
Immediate Anesthesia Transfer of Care Note  Patient: Rachel Mcintosh  Procedure(s) Performed: Procedure(s) with comments: Left Above Knee Amputation Revision, Place antibiotic beads (Left) - Left Above Knee Amputation Revision, Place antibiotic beads  Patient Location: PACU  Anesthesia Type:General  Level of Consciousness: awake, alert  and oriented  Airway & Oxygen Therapy: Patient Spontanous Breathing and Patient connected to nasal cannula oxygen  Post-op Assessment: Report given to PACU RN and Post -op Vital signs reviewed and stable  Post vital signs: Reviewed and stable  Complications: No apparent anesthesia complications

## 2012-12-02 NOTE — Preoperative (Signed)
Beta Blockers   Reason not to administer Beta Blockers: not prescribed 
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
## 2012-12-05 ENCOUNTER — Encounter (HOSPITAL_COMMUNITY): Payer: Self-pay | Admitting: Orthopedic Surgery

## 2013-06-21 ENCOUNTER — Emergency Department (INDEPENDENT_AMBULATORY_CARE_PROVIDER_SITE_OTHER)
Admission: EM | Admit: 2013-06-21 | Discharge: 2013-06-21 | Disposition: A | Discharge status: Other Acute Inpatient Hospital | Payer: Medicare Other | Source: Home / Self Care | Attending: Family Medicine | Admitting: Family Medicine

## 2013-06-21 ENCOUNTER — Encounter (HOSPITAL_COMMUNITY): Payer: Self-pay | Admitting: Emergency Medicine

## 2013-06-21 ENCOUNTER — Emergency Department (INDEPENDENT_AMBULATORY_CARE_PROVIDER_SITE_OTHER): Payer: Medicare Other

## 2013-06-21 ENCOUNTER — Emergency Department (HOSPITAL_COMMUNITY)
Admission: EM | Admit: 2013-06-21 | Discharge: 2013-06-21 | Disposition: A | Discharge status: Home / Self Care | Payer: Medicare Other | Attending: Emergency Medicine | Admitting: Emergency Medicine

## 2013-06-21 DIAGNOSIS — Z8669 Personal history of other diseases of the nervous system and sense organs: Secondary | ICD-10-CM | POA: Insufficient documentation

## 2013-06-21 DIAGNOSIS — Z8719 Personal history of other diseases of the digestive system: Secondary | ICD-10-CM | POA: Insufficient documentation

## 2013-06-21 DIAGNOSIS — Z8781 Personal history of (healed) traumatic fracture: Secondary | ICD-10-CM | POA: Diagnosis not present

## 2013-06-21 DIAGNOSIS — F17200 Nicotine dependence, unspecified, uncomplicated: Secondary | ICD-10-CM | POA: Insufficient documentation

## 2013-06-21 DIAGNOSIS — R0789 Other chest pain: Secondary | ICD-10-CM

## 2013-06-21 DIAGNOSIS — R079 Chest pain, unspecified: Secondary | ICD-10-CM | POA: Diagnosis not present

## 2013-06-21 DIAGNOSIS — R05 Cough: Secondary | ICD-10-CM | POA: Diagnosis not present

## 2013-06-21 DIAGNOSIS — R071 Chest pain on breathing: Secondary | ICD-10-CM | POA: Insufficient documentation

## 2013-06-21 DIAGNOSIS — Z8709 Personal history of other diseases of the respiratory system: Secondary | ICD-10-CM | POA: Diagnosis not present

## 2013-06-21 DIAGNOSIS — Z862 Personal history of diseases of the blood and blood-forming organs and certain disorders involving the immune mechanism: Secondary | ICD-10-CM | POA: Diagnosis not present

## 2013-06-21 DIAGNOSIS — Z8639 Personal history of other endocrine, nutritional and metabolic disease: Secondary | ICD-10-CM | POA: Insufficient documentation

## 2013-06-21 DIAGNOSIS — Z8739 Personal history of other diseases of the musculoskeletal system and connective tissue: Secondary | ICD-10-CM | POA: Insufficient documentation

## 2013-06-21 DIAGNOSIS — F411 Generalized anxiety disorder: Secondary | ICD-10-CM | POA: Diagnosis not present

## 2013-06-21 DIAGNOSIS — R059 Cough, unspecified: Secondary | ICD-10-CM | POA: Diagnosis not present

## 2013-06-21 DIAGNOSIS — Z79899 Other long term (current) drug therapy: Secondary | ICD-10-CM | POA: Insufficient documentation

## 2013-06-21 LAB — CBC WITH DIFFERENTIAL/PLATELET
Basophils Absolute: 0.1 10*3/uL (ref 0.0–0.1)
Eosinophils Relative: 1 % (ref 0–5)
HCT: 45.8 % (ref 36.0–46.0)
Lymphocytes Relative: 25 % (ref 12–46)
MCHC: 35.2 g/dL (ref 30.0–36.0)
MCV: 88.1 fL (ref 78.0–100.0)
Monocytes Absolute: 0.4 10*3/uL (ref 0.1–1.0)
RDW: 12.8 % (ref 11.5–15.5)
WBC: 8.3 10*3/uL (ref 4.0–10.5)

## 2013-06-21 LAB — BASIC METABOLIC PANEL
CO2: 25 mEq/L (ref 19–32)
Calcium: 9.7 mg/dL (ref 8.4–10.5)
Creatinine, Ser: 0.52 mg/dL (ref 0.50–1.10)

## 2013-06-21 LAB — POCT I-STAT TROPONIN I: Troponin i, poc: 0 ng/mL (ref 0.00–0.08)

## 2013-06-21 MED ORDER — ATROPINE SULFATE 1 MG/ML IJ SOLN: 0.5000 mg | Freq: Once | INTRAMUSCULAR | Status: DC

## 2013-06-21 MED ORDER — IBUPROFEN 600 MG PO TABS: 600.0000 mg | ORAL_TABLET | Freq: Four times a day (QID) | ORAL | Status: DC | PRN

## 2013-06-21 NOTE — ED Notes (Signed)
States she bent over a chair to pick up apiece of puzzle, and started to have discomfort in left lateral chest area after that any time she coughs or breathes deeply ; c/o green /yellow secretions

## 2013-06-21 NOTE — ED Notes (Signed)
Pt in from urgent care, pt went there for evaluation of pain to left lower rib area, states she has a history of pleurisy and this feels similar, the pain is only with deep inspiration or coughing, pt sent here for further evaluation
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
## 2013-07-06 ENCOUNTER — Other Ambulatory Visit: Payer: Self-pay

## 2013-11-01 ENCOUNTER — Ambulatory Visit (INDEPENDENT_AMBULATORY_CARE_PROVIDER_SITE_OTHER): Payer: Medicare Other | Admitting: Family Medicine

## 2013-11-01 ENCOUNTER — Telehealth: Payer: Self-pay | Admitting: Family Medicine

## 2013-11-01 ENCOUNTER — Encounter: Payer: Self-pay | Admitting: Family Medicine

## 2013-11-01 VITALS — BP 126/78 | HR 72 | Temp 98.0°F | Ht 66.0 in | Wt 136.5 lb

## 2013-11-01 DIAGNOSIS — K219 Gastro-esophageal reflux disease without esophagitis: Secondary | ICD-10-CM | POA: Insufficient documentation

## 2013-11-01 DIAGNOSIS — S78119A Complete traumatic amputation at level between unspecified hip and knee, initial encounter: Secondary | ICD-10-CM

## 2013-11-01 DIAGNOSIS — M86659 Other chronic osteomyelitis, unspecified thigh: Secondary | ICD-10-CM

## 2013-11-01 DIAGNOSIS — E785 Hyperlipidemia, unspecified: Secondary | ICD-10-CM | POA: Diagnosis not present

## 2013-11-01 DIAGNOSIS — M86459 Chronic osteomyelitis with draining sinus, unspecified femur: Secondary | ICD-10-CM

## 2013-11-01 DIAGNOSIS — F17200 Nicotine dependence, unspecified, uncomplicated: Secondary | ICD-10-CM

## 2013-11-01 DIAGNOSIS — S78112A Complete traumatic amputation at level between left hip and knee, initial encounter: Secondary | ICD-10-CM | POA: Insufficient documentation

## 2013-11-01 DIAGNOSIS — M5137 Other intervertebral disc degeneration, lumbosacral region: Secondary | ICD-10-CM | POA: Diagnosis not present

## 2013-11-01 DIAGNOSIS — M5136 Other intervertebral disc degeneration, lumbar region: Secondary | ICD-10-CM | POA: Insufficient documentation

## 2013-11-01 DIAGNOSIS — Z87891 Personal history of nicotine dependence: Secondary | ICD-10-CM | POA: Insufficient documentation

## 2013-11-01 NOTE — Patient Instructions (Signed)
Good to meet you today, call us with quesitons. Return in 2-3 months for physical, prior fasting for blood work. We will request records from Dr Sharol Given( latest office note).
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
## 2013-11-01 NOTE — Assessment & Plan Note (Signed)
S/p multiple surgeries in last 1.5 years by ortho.  Currently stable.

## 2013-11-01 NOTE — Assessment & Plan Note (Signed)
PRN hydrocodone - rarely use. Follows regularly with ortho. On disability for this. Will review records from [place].

## 2013-11-01 NOTE — Telephone Encounter (Signed)
Relevant patient education assigned to patient using Emmi. ° °

## 2013-11-01 NOTE — Assessment & Plan Note (Signed)
Due for recheck - will check FLP when returns for fasting blood work prior to physical.

## 2013-11-01 NOTE — Progress Notes (Signed)
Pre visit review using our clinic review tool, if applicable. No additional management support is needed unless otherwise documented below in the visit note. 

## 2013-11-01 NOTE — Assessment & Plan Note (Signed)
Encouraged cessation.
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
## 2013-12-05 ENCOUNTER — Telehealth: Payer: Self-pay | Admitting: Family Medicine

## 2013-12-05 ENCOUNTER — Encounter: Payer: Self-pay | Admitting: Internal Medicine

## 2013-12-05 ENCOUNTER — Ambulatory Visit (INDEPENDENT_AMBULATORY_CARE_PROVIDER_SITE_OTHER): Payer: Medicare Other | Admitting: Internal Medicine

## 2013-12-05 VITALS — BP 116/68 | HR 82 | Wt 138.0 lb

## 2013-12-05 DIAGNOSIS — M869 Osteomyelitis, unspecified: Secondary | ICD-10-CM | POA: Diagnosis not present

## 2013-12-05 DIAGNOSIS — R0789 Other chest pain: Secondary | ICD-10-CM

## 2013-12-05 DIAGNOSIS — R071 Chest pain on breathing: Secondary | ICD-10-CM

## 2013-12-05 MED ORDER — PREDNISONE 10 MG PO TABS: ORAL_TABLET | ORAL | Status: DC

## 2013-12-05 MED ORDER — HYDROCODONE-ACETAMINOPHEN 5-325 MG PO TABS: 1.0000 | ORAL_TABLET | Freq: Four times a day (QID) | ORAL | Status: DC | PRN

## 2013-12-05 NOTE — Progress Notes (Signed)
Subjective:    Patient ID: Rachel Mcintosh, female    DOB: February 23, 1956, 58 y.o.   MRN: [id number]  HPI  Pt presents to the clinic today with c/o pain behind the right breast. She noticed this 2 days ago. She reports the pain comes and goes. It is worse with taking deep breaths. She describes the pain as burning and crampy. Seh does have an associated dry cough.  She reports something similar to this yearly. She usually gets a zpack or levaquin and it goes away. She is a current smoker.  Additionally, she would like a refill of her hydrocodone today for chronic osteomyelitis.  Review of Systems      Past Medical History  Diagnosis Date  . Hyperlipidemia     controlled by diet  . History of anxiety     situational/stress induced, previously on paxil  . Urine incontinence     "difficulty holding urine"  . GERD (gastroesophageal reflux disease)     doing well off meds  . Cervical spine fracture 2008    hx of   . Above knee amputation of left lower extremity 2008    disability since then  . DDD (degenerative disc disease), lumbar 2007    s/p surgery  . Smoker     Current Outpatient Prescriptions  Medication Sig Dispense Refill  . HYDROcodone-acetaminophen (NORCO/VICODIN) 5-325 MG per tablet Take 1 tablet by mouth every 6 (six) hours as needed for moderate pain (rare use).       No current facility-administered medications for this visit.    Allergies  Allergen Reactions  . Codeine Nausea And Vomiting  . Sulfa Antibiotics Other (See Comments)    Unsure of allergy    Family History  Problem Relation Age of Onset  . Hyperlipidemia Mother   . Hyperlipidemia Father   . Cancer Paternal Grandfather     prostate  . Cancer Other     breast (great grandmother)  . CAD Maternal Uncle 40    MI  . Stroke Neg Hx   . Diabetes Neg Hx     History   Social History  . Marital Status: Single    Spouse Name: N/A    Number of Children: N/A  . Years of Education: N/A    Occupational History  . Not on file.   Social History Main Topics  . Smoking status: Current Every Day Smoker -- 0.50 packs/day for 35 years  . Smokeless tobacco: Never Used  . Alcohol Use: Yes     Comment: social  . Drug Use: No  . Sexual Activity: Not on file   Other Topics Concern  . 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  Respiratory: Pt reports pain with deep breath. Denies difficulty breathing, shortness of breath, cough or sputum production.   Cardiovascular: Denies chest pain, chest tightness, palpitations or swelling in the hands or feet.  Gastrointestinal: Denies abdominal pain, bloating, constipation, diarrhea or blood in the stool.    No other specific complaints in a complete review of systems (except as listed in HPI above).  Objective:   Physical Exam   BP 116/68  Pulse 82  Wt 138 lb (62.596 kg)  SpO2 98% Wt Readings from Last 3 Encounters:  12/05/13 138 lb (62.596 kg)  11/01/13  136 lb 8 oz (61.916 kg)  06/21/13 130 lb (58.968 kg)    General: Appears her stated age, well developed, well nourished in NAD. HEENT: Head: normal shape and size; Eyes: sclera white, no icterus, conjunctiva pink, PERRLA and EOMs intact; Ears: Tm's gray and intact, normal light reflex; Nose: mucosa pink and moist, septum midline; Throat/Mouth: Teeth present, mucosa pink and moist, no exudate, lesions or ulcerations noted.  Cardiovascular: Normal rate and rhythm. S1,S2 noted.  No murmur, rubs or gallops noted. No JVD or BLE edema. No carotid bruits noted. Pulmonary/Chest: Normal effort and positive vesicular breath sounds. No respiratory distress. No wheezes, rales or ronchi noted.  Abdomen: Soft and nontender. Normal bowel sounds, no bruits noted. No distention or masses noted. Liver, spleen and  kidneys non palpable.   BMET    Component Value Date/Time   NA 138 06/21/2013 1601   K 3.4* 06/21/2013 1601   CL 101 06/21/2013 1601   CO2 25 06/21/2013 1601   GLUCOSE 97 06/21/2013 1601   BUN 6 06/21/2013 1601   CREATININE 0.52 06/21/2013 1601   CALCIUM 9.7 06/21/2013 1601   GFRNONAA >90 06/21/2013 1601   GFRAA >90 06/21/2013 1601    Lipid Panel  No results found for this basename: chol, trig, hdl, cholhdl, vldl, ldlcalc    CBC    Component Value Date/Time   WBC 8.3 06/21/2013 1601   RBC 5.20* 06/21/2013 1601   HGB 16.1* 06/21/2013 1601   HCT 45.8 06/21/2013 1601   PLT 271 06/21/2013 1601   MCV 88.1 06/21/2013 1601   MCH 31.0 06/21/2013 1601   MCHC 35.2 06/21/2013 1601   RDW 12.8 06/21/2013 1601   LYMPHSABS 2.0 06/21/2013 1601   MONOABS 0.4 06/21/2013 1601   EOSABS 0.1 06/21/2013 1601   BASOSABS 0.1 06/21/2013 1601    Hgb A1C No results found for this basename: HGBA1C        Assessment & Plan:   Pleuritic chest wall pain:  I do not think she needs an abx, she is not very happy with this Will give eRx for pred taper for inflammation  Osteomyelitis:  RX for hydrocodone  RTC as needed or if symptoms persist or worsen

## 2013-12-05 NOTE — Progress Notes (Signed)
Pre visit review using our clinic review tool, if applicable. No additional management support is needed unless otherwise documented below in the visit note. 

## 2013-12-05 NOTE — Patient Instructions (Addendum)

## 2013-12-05 NOTE — Telephone Encounter (Signed)
Relevant patient education assigned to patient using Emmi. ° °

## 2013-12-08 ENCOUNTER — Other Ambulatory Visit: Payer: Self-pay | Admitting: Internal Medicine

## 2013-12-08 ENCOUNTER — Telehealth: Payer: Self-pay

## 2013-12-08 MED ORDER — AZITHROMYCIN 250 MG PO TABS: ORAL_TABLET | ORAL | Status: DC

## 2013-12-08 NOTE — Telephone Encounter (Signed)
Pt is aware as instructed 

## 2013-12-08 NOTE — Telephone Encounter (Signed)
Pt left v/m; pt was seen 12/05/13 and pt has been taking prednisone with no improvement. Pt still has non prod cough and when takes deep breath pain is intensified behind rt breast. Pt has not taken temp but last 2 days pt feels hot on and off. Pt said she really cannot afford another office visit and request either Z pack or Levaquin sent to [place].Pt request cb.

## 2013-12-08 NOTE — Telephone Encounter (Signed)
Will send in zpack

## 2013-12-12 ENCOUNTER — Ambulatory Visit: Payer: Medicare Other | Admitting: Family Medicine

## 2013-12-14 ENCOUNTER — Other Ambulatory Visit: Payer: Self-pay | Admitting: Internal Medicine

## 2013-12-14 MED ORDER — LEVOFLOXACIN 500 MG PO TABS: 500.0000 mg | ORAL_TABLET | Freq: Every day | ORAL | Status: DC

## 2013-12-14 NOTE — Telephone Encounter (Signed)
Pt is aware as instructed 

## 2013-12-14 NOTE — Telephone Encounter (Signed)
levaquin sent to pharmacy

## 2013-12-14 NOTE — Telephone Encounter (Signed)
Pt has finished Zpak and still has non prod cough with hurting on rt side of chest that is migrating toward center of chest; no wheezing and no SOB. Pt said she has this every year. Pt prefers not to schedule another appt and request Levaquin to [address]pt said Levaquin always takes care of problem in past). Pt said only other symptom is she gets tired easily. Advised pt if her condition changes or worsens prior to cb to go to [facility]. Pt voiced understanding.

## 2014-02-01 ENCOUNTER — Encounter: Payer: Medicare Other | Admitting: Family Medicine

## 2014-02-09 ENCOUNTER — Ambulatory Visit (INDEPENDENT_AMBULATORY_CARE_PROVIDER_SITE_OTHER): Payer: Medicare Other | Admitting: Family Medicine

## 2014-02-09 ENCOUNTER — Encounter: Payer: Self-pay | Admitting: Family Medicine

## 2014-02-09 VITALS — BP 128/78 | HR 88 | Temp 98.1°F | Ht 66.0 in | Wt 140.2 lb

## 2014-02-09 DIAGNOSIS — F17200 Nicotine dependence, unspecified, uncomplicated: Secondary | ICD-10-CM

## 2014-02-09 DIAGNOSIS — Z1231 Encounter for screening mammogram for malignant neoplasm of breast: Secondary | ICD-10-CM | POA: Diagnosis not present

## 2014-02-09 DIAGNOSIS — E785 Hyperlipidemia, unspecified: Secondary | ICD-10-CM

## 2014-02-09 DIAGNOSIS — S78119A Complete traumatic amputation at level between unspecified hip and knee, initial encounter: Secondary | ICD-10-CM

## 2014-02-09 DIAGNOSIS — Z0000 Encounter for general adult medical examination without abnormal findings: Secondary | ICD-10-CM | POA: Diagnosis not present

## 2014-02-09 DIAGNOSIS — S78112A Complete traumatic amputation at level between left hip and knee, initial encounter: Secondary | ICD-10-CM

## 2014-02-09 LAB — BASIC METABOLIC PANEL
BUN: 8 mg/dL (ref 6–23)
CO2: 27 meq/L (ref 19–32)
Calcium: 10.1 mg/dL (ref 8.4–10.5)
Chloride: 102 mEq/L (ref 96–112)
Creatinine, Ser: 0.6 mg/dL (ref 0.4–1.2)
GFR: 114.47 mL/min (ref >60.00)
GLUCOSE: 87 mg/dL (ref 70–99)
POTASSIUM: 3.8 meq/L (ref 3.5–5.1)
SODIUM: 140 meq/L (ref 135–145)

## 2014-02-09 LAB — LIPID PANEL
CHOLESTEROL: 241 mg/dL — AB (ref 0–200)
HDL: 52.9 mg/dL (ref >39.00)
LDL Cholesterol: 164 mg/dL — ABNORMAL HIGH (ref 0–99)
NonHDL: 188.1
TRIGLYCERIDES: 120 mg/dL (ref 0.0–149.0)
Total CHOL/HDL Ratio: 5
VLDL: 24 mg/dL (ref 0.0–40.0)

## 2014-02-09 NOTE — Assessment & Plan Note (Signed)
Chronic, recheck FLP today.

## 2014-02-09 NOTE — Progress Notes (Signed)
Pre visit review using our clinic review tool, if applicable. No additional management support is needed unless otherwise documented below in the visit note. 

## 2014-02-09 NOTE — Assessment & Plan Note (Signed)
With prosthetic in place. PRN hydrocodone

## 2014-02-09 NOTE — Assessment & Plan Note (Signed)
Continue to encourage cessation. Did quit 3 yrs at prior attempt

## 2014-02-09 NOTE — Progress Notes (Signed)
BP 128/78  Pulse 88  Temp(Src) 98.1 F (36.7 C) (Oral)  Ht 5\' 6"  (1.676 m)  Wt 140 lb 4 oz (63.617 kg)  BMI 22.65 kg/m2   CC: medicare wellness exam  Subjective:    Patient ID: Rachel Mcintosh, female    DOB: Nov 22, 1955, 58 y.o.   MRN: [id number]  HPI: Rachel Mcintosh is a 58 y.o. female presenting on 02/09/2014 for Annual Exam   Smoker - 1/2 ppd. Contemplative. Not currently interested in medication for this.  Preventative: Last CPE 05/2012  Colonoscopy 2013 WNL [facility])  Well woman exam 07/2012 [facility] Dr. Christophe Louis). No paps 2/2 h/o hysterectomy. Ovaries remain.  G3P2  Mammogram 2007 - due for this, will schedule at breast center. Flu yearly Pneumovax 05/2012 Tetanus 2008? Advanced directive - daughter would be HCPOA. Seat belt use discussed Sunscreen use discussed. No changing moles.  Lives alone Edu: HS Occupation: disability since [place] 2008, was supervisor at [employer] Activity: no regular exercise Diet: some water, fruits daily  Relevant past medical, surgical, family and social history reviewed and updated as indicated.  Allergies and medications reviewed and updated. Current Outpatient Prescriptions on File Prior to Visit  Medication Sig  . HYDROcodone-acetaminophen (NORCO/VICODIN) 5-325 MG per tablet Take 1 tablet by mouth every 6 (six) hours as needed for moderate pain (rare use).   No current facility-administered medications on file prior to visit.    Review of Systems Per HPI unless specifically indicated above    Objective:    BP 128/78  Pulse 88  Temp(Src) 98.1 F (36.7 C) (Oral)  Ht 5\' 6"  (1.676 m)  Wt 140 lb 4 oz (63.617 kg)  BMI 22.65 kg/m2  Physical Exam  Nursing note and vitals reviewed. Constitutional: She is oriented to person, place, and time. She appears well-developed and well-nourished. No distress.  HENT:  Head: Normocephalic and atraumatic.  Right Ear: Hearing, tympanic membrane, external ear and ear canal normal.  Left  Ear: Hearing, tympanic membrane, external ear and ear canal normal.  Nose: Nose normal.  Mouth/Throat: Uvula is midline, oropharynx is clear and moist and mucous membranes are normal. No oropharyngeal exudate, posterior oropharyngeal edema or posterior oropharyngeal erythema.  Eyes: Conjunctivae and EOM are normal. Pupils are equal, round, and reactive to light. No scleral icterus.  Neck: Normal range of motion. Neck supple. No thyromegaly present.  Cardiovascular: Normal rate, regular rhythm, normal heart sounds and intact distal pulses.   No murmur heard. Pulses:      Radial pulses are 2+ on the right side, and 2+ on the left side.  Pulmonary/Chest: Effort normal and breath sounds normal. No respiratory distress. She has no wheezes. She has no rales.  Abdominal: Soft. Bowel sounds are normal. She exhibits no distension and no mass. There is no tenderness. There is no rebound and no guarding.  Musculoskeletal: Normal range of motion. She exhibits no edema.  L knee AKA with prosthetic  Lymphadenopathy:    She has no cervical adenopathy.  Neurological: She is alert and oriented to person, place, and time.  CN grossly intact, station and gait intact  Skin: Skin is warm and dry. No rash noted.  Psychiatric: She has a normal mood and affect. Her behavior is normal. Judgment and thought content normal.       Assessment & Plan:   Problem List Items Addressed This Visit   Smoker     Continue to encourage cessation. Did quit 3 yrs at prior attempt  Medicare annual wellness visit, subsequent - Primary     I have personally reviewed the Medicare Annual Wellness questionnaire and have noted 1. The patient's medical and social history 2. Their use of alcohol, tobacco or illicit drugs 3. Their current medications and supplements 4. The patient's functional ability including ADL's, fall risks, home safety risks and hearing or visual impairment. 5. Diet and physical activity 6. Evidence for  depression or mood disorders The patients weight, height, BMI have been recorded in the chart.  Hearing and vision has been addressed. I have made referrals, counseling and provided education to the patient based review of the above and I have provided the pt with a written personalized care plan for preventive services. See scanned questionairre. Advanced directives discussed: would want daughter to be HCPOA.  Reviewed preventative protocols and updated unless pt declined.    Hyperlipidemia     Chronic, recheck FLP today.    Relevant Orders      Lipid panel      Basic metabolic panel   Above knee amputation of left lower extremity     With prosthetic in place. PRN hydrocodone     Other Visit Diagnoses   Other screening mammogram        Relevant Orders       MM DIGITAL SCREENING BILATERAL        Follow up plan: Return in about 1 year (around 02/10/2015), or as needed, for medicare wellness visit.

## 2014-02-09 NOTE — Assessment & Plan Note (Signed)
I have personally reviewed the Medicare Annual Wellness questionnaire and have noted 1. The patient's medical and social history 2. Their use of alcohol, tobacco or illicit drugs 3. Their current medications and supplements 4. The patient's functional ability including ADL's, fall risks, home safety risks and hearing or visual impairment. 5. Diet and physical activity 6. Evidence for depression or mood disorders The patients weight, height, BMI have been recorded in the chart.  Hearing and vision has been addressed. I have made referrals, counseling and provided education to the patient based review of the above and I have provided the pt with a written personalized care plan for preventive services. See scanned questionairre. Advanced directives discussed: would want daughter to be HCPOA.  Reviewed preventative protocols and updated unless pt declined.

## 2014-02-09 NOTE — Patient Instructions (Signed)
We will call you to set up mammogram at breast center. If you haven't heard from [place] by 2 weeks call us. Blood work today - will call with results. Return as needed or in 1 year for next physical. Keep thinking about quitting smoking again.

## 2014-02-12 ENCOUNTER — Encounter: Payer: Self-pay | Admitting: *Deleted

## 2014-02-28 ENCOUNTER — Ambulatory Visit
Admission: RE | Admit: 2014-02-28 | Discharge: 2014-02-28 | Disposition: A | Discharge status: Home / Self Care | Payer: Medicare Other | Source: Ambulatory Visit | Attending: Family Medicine | Admitting: Family Medicine

## 2014-02-28 DIAGNOSIS — Z1231 Encounter for screening mammogram for malignant neoplasm of breast: Secondary | ICD-10-CM | POA: Diagnosis not present

## 2014-03-12 ENCOUNTER — Ambulatory Visit (INDEPENDENT_AMBULATORY_CARE_PROVIDER_SITE_OTHER): Payer: Medicare Other | Admitting: Family Medicine

## 2014-03-12 ENCOUNTER — Encounter: Payer: Self-pay | Admitting: Family Medicine

## 2014-03-12 VITALS — BP 122/86 | HR 72 | Temp 98.1°F | Wt 140.5 lb

## 2014-03-12 DIAGNOSIS — K219 Gastro-esophageal reflux disease without esophagitis: Secondary | ICD-10-CM | POA: Diagnosis not present

## 2014-03-12 MED ORDER — OMEPRAZOLE 40 MG PO CPDR: 40.0000 mg | DELAYED_RELEASE_CAPSULE | Freq: Every day | ORAL | Status: DC

## 2014-03-12 NOTE — Progress Notes (Signed)
   BP 122/86  Pulse 72  Temp(Src) 98.1 F (36.7 C) (Oral)  Wt 140 lb 8 oz (63.73 kg)   CC: discuss GERD  Subjective:    Patient ID: Rachel Mcintosh, female    DOB: Jan 25, 1956, 58 y.o.   MRN: [id number]  HPI: Rachel Mcintosh is a 58 y.o. female presenting on 03/12/2014 for Gastrophageal Reflux   GERD - worsening recently over last several weeks. Wonders if diet related - started eating more oatmeal over last month to help cholesterol levels. Has increased vegetables and fruits. Has awoken her from sleep. Mainly chest burning sensation with acid reflux. So far hasn't tried meds for this other than tums/rolaids with no improvement. Daily discomfort/irritation.  1-2 cups coffee per day. 1 glass of Pepsi daily as well. Smoking - 1/2 ppd.  No nausea/vomiting, weight loss, dysphagia. No food stuck sensation. No early satiety. No abd pain. No change in stress level.  Colonoscopy 2013 [facility])  Did have swallow test done 2 yrs ago WNL per pt recollection.  Wt Readings from Last 3 Encounters:  03/12/14 140 lb 8 oz (63.73 kg)  02/09/14 140 lb 4 oz (63.617 kg)  12/05/13 138 lb (62.596 kg)  Body mass index is 22.69 kg/(m^2).  Relevant past medical, surgical, family and social history reviewed and updated as indicated.  Allergies and medications reviewed and updated. Current Outpatient Prescriptions on File Prior to Visit  Medication Sig  . HYDROcodone-acetaminophen (NORCO/VICODIN) 5-325 MG per tablet Take 1 tablet by mouth every 6 (six) hours as needed for moderate pain (rare use).   No current facility-administered medications on file prior to visit.    Review of Systems Per HPI unless specifically indicated above    Objective:    BP 122/86  Pulse 72  Temp(Src) 98.1 F (36.7 C) (Oral)  Wt 140 lb 8 oz (63.73 kg)  Physical Exam  Nursing note and vitals reviewed. Constitutional: She appears well-developed and well-nourished. No distress.  Abdominal: Soft. Normal appearance and  bowel sounds are normal. She exhibits no distension and no mass. There is no hepatosplenomegaly. There is no tenderness. There is no rigidity, no rebound, no guarding and negative Murphy's sign.  Psychiatric: She has a normal mood and affect.       Assessment & Plan:   Problem List Items Addressed This Visit   GERD (gastroesophageal reflux disease) - Primary     Recently worsening - after she started trying to eat a healthier diet.  ?vegetable or oatmeal related for her - would do trial off oatmeal. Anticipate diet related worsening of known GERD. Discussed dietary changes Start omeprazole 40mg  daily for 3 weeks then prn. Consider decreasing dose to 20mg  if well controlled. Update if sxs persist or fail to improve. Pt agrees with plan.    Relevant Medications      omeprazole (PRILOSEC) capsule       Follow up plan: Return if symptoms worsen or fail to improve.

## 2014-03-12 NOTE — Assessment & Plan Note (Signed)
Recently worsening - after she started trying to eat a healthier diet.  ?vegetable or oatmeal related for her - would do trial off oatmeal. Anticipate diet related worsening of known GERD. Discussed dietary changes Start omeprazole 40mg  daily for 3 weeks then prn. Consider decreasing dose to 20mg  if well controlled. Update if sxs persist or fail to improve. Pt agrees with plan.

## 2014-03-12 NOTE — Patient Instructions (Signed)
For worsening GERD symptoms - start omeprazole 40mg  daily for 3 weeks then as needed. May decrease dose to 20mg  if doing better. Head of bed elevated. Avoidance of citrus, fatty foods, chocolate, peppermint, and excessive alcohol, along with sodas, orange juice (acidic drinks) At least a few hours between dinner and bed, minimize naps after eating. Cut back or quit smoking.  Food Choices for Gastroesophageal Reflux Disease When you have gastroesophageal reflux disease (GERD), the foods you eat and your eating habits are very important. Choosing the right foods can help ease the discomfort of GERD. WHAT GENERAL GUIDELINES DO I NEED TO FOLLOW?  Choose fruits, vegetables, whole grains, low-fat dairy products, and low-fat meat, fish, and poultry.  Limit fats such as oils, salad dressings, butter, nuts, and avocado.  Keep a food diary to identify foods that cause symptoms.  Avoid foods that cause reflux. These may be different for different people.  Eat frequent small meals instead of three large meals each day.  Eat your meals slowly, in a relaxed setting.  Limit fried foods.  Cook foods using methods other than frying.  Avoid drinking alcohol.  Avoid drinking large amounts of liquids with your meals.  Avoid bending over or lying down until 2-3 hours after eating. WHAT FOODS ARE NOT RECOMMENDED? The following are some foods and drinks that may worsen your symptoms: Vegetables Tomatoes. Tomato juice. Tomato and spaghetti sauce. Chili peppers. Onion and garlic. Horseradish. Fruits Oranges, grapefruit, and lemon (fruit and juice). Meats High-fat meats, fish, and poultry. This includes hot dogs, ribs, ham, sausage, salami, and bacon. Dairy Whole milk and chocolate milk. Sour cream. Cream. Butter. Ice cream. Cream cheese.  Beverages Coffee and tea, with or without caffeine. Carbonated beverages or energy drinks. Condiments Hot sauce. Barbecue sauce.  Sweets/Desserts Chocolate and  cocoa. Donuts. Peppermint and spearmint. Fats and Oils High-fat foods, including [place] fries and potato chips. Other Vinegar. Strong spices, such as black pepper, white pepper, red pepper, cayenne, curry powder, cloves, ginger, and chili powder. The items listed above may not be a complete list of foods and beverages to avoid. Contact your dietitian for more information. Document Released: 08/17/2005 Document Revised: 08/22/2013 Document Reviewed: 06/21/2013 [facility] Patient Information 2015 [place], [place]. This information is not intended to replace advice given to you by your health care provider. Make sure you discuss any questions you have with your health care provider.

## 2014-03-12 NOTE — Progress Notes (Signed)
Pre visit review using our clinic review tool, if applicable. No additional management support is needed unless otherwise documented below in the visit note. 

## 2014-03-16 LAB — HM MAMMOGRAPHY: HM Mammogram: NORMAL

## 2014-03-19 ENCOUNTER — Encounter: Payer: Self-pay | Admitting: *Deleted

## 2014-05-25 ENCOUNTER — Telehealth: Payer: Self-pay | Admitting: Family Medicine

## 2014-05-25 NOTE — Telephone Encounter (Signed)
Pt called stated dr g put her on omeprazole.  She stated she has been taking this for 2 months now and it is not working.  Can he call her something else in  [facility] [address]

## 2014-05-27 MED ORDER — PANTOPRAZOLE SODIUM 40 MG PO TBEC: 40.0000 mg | DELAYED_RELEASE_TABLET | Freq: Every day | ORAL | Status: DC

## 2014-05-27 NOTE — Telephone Encounter (Signed)
Would try protonix 40mg  daily - sent to pharmacy. Would also suggest referral to GI given presumed GERD not improved with PPI therapy to ensure not other cause like gastritisesophagitis. Let me know if agrees to this

## 2014-05-28 NOTE — Telephone Encounter (Signed)
Pt states she would like to hold off on the referral and try the Protonix for a few weeks first--pt states she will call if she changes her mind

## 2014-06-15 ENCOUNTER — Other Ambulatory Visit: Payer: Self-pay

## 2014-09-27 ENCOUNTER — Other Ambulatory Visit: Payer: Self-pay | Admitting: Family Medicine

## 2014-10-22 ENCOUNTER — Ambulatory Visit (INDEPENDENT_AMBULATORY_CARE_PROVIDER_SITE_OTHER): Payer: Medicare Other | Admitting: Internal Medicine

## 2014-10-22 ENCOUNTER — Encounter: Payer: Self-pay | Admitting: Internal Medicine

## 2014-10-22 VITALS — BP 118/72 | HR 73 | Temp 97.5°F | Wt 142.0 lb

## 2014-10-22 DIAGNOSIS — J209 Acute bronchitis, unspecified: Secondary | ICD-10-CM

## 2014-10-22 MED ORDER — ALBUTEROL SULFATE HFA 108 (90 BASE) MCG/ACT IN AERS: 2.0000 | INHALATION_SPRAY | Freq: Four times a day (QID) | RESPIRATORY_TRACT | Status: DC | PRN

## 2014-10-22 MED ORDER — DOXYCYCLINE HYCLATE 100 MG PO TABS: 100.0000 mg | ORAL_TABLET | Freq: Two times a day (BID) | ORAL | Status: DC

## 2014-10-22 NOTE — Progress Notes (Signed)
   Subjective:    Patient ID: Rachel Mcintosh, female    DOB: 1955/11/29, 59 y.o.   MRN: [id number]  HPI Rachel Mcintosh is a 59 year old female who presents today with chief complaint of chest congestion and cough.  Symptoms started one week ago and patient has a productive cough with yellow sputum.   She has not taken anything over the counter.  She does complain of shortness of breath at rest for the past 2 days and just not feeling well. Denies fever and chills.     Review of Systems  Constitutional: Negative for fever, chills and fatigue.  HENT: Positive for congestion and postnasal drip. Negative for sinus pressure and sore throat.   Respiratory: Positive for cough and shortness of breath. Negative for wheezing.   Cardiovascular: Negative for chest pain, palpitations and leg swelling.  Skin: Negative for color change, pallor, rash and wound.  Neurological: Negative for dizziness, light-headedness and headaches.   Past Medical History  Diagnosis Date  . Hyperlipidemia     controlled by diet  . History of anxiety     situational/stress induced, previously on paxil  . Urine incontinence     "difficulty holding urine"  . GERD (gastroesophageal reflux disease)     doing well off meds  . Cervical spine fracture 2008    hx of   . Above knee amputation of left lower extremity 2008    disability since then  . DDD (degenerative disc disease), lumbar 2007    s/p surgery  . Smoker    Family History  Problem Relation Age of Onset  . Hyperlipidemia Mother   . Hyperlipidemia Father   . Cancer Paternal Grandfather     prostate  . Cancer Other     breast (great grandmother)  . CAD Maternal Uncle 40    MI  . Stroke Neg Hx   . Diabetes Neg Hx    Current Outpatient Prescriptions on File Prior to Visit  Medication Sig Dispense Refill  . pantoprazole (PROTONIX) 40 MG tablet TAKE ONE TABLET BY MOUTH ONCE DAILY 30 tablet 3   No current facility-administered medications on file prior to  visit.       Objective:   Physical Exam  Constitutional: She is oriented to person, place, and time. She appears well-developed and well-nourished.  HENT:  Head: Normocephalic and atraumatic.  Right Ear: External ear normal.  Left Ear: External ear normal.  Neck: Normal range of motion. Neck supple.  Cardiovascular: Normal rate, regular rhythm and normal heart sounds.   Pulmonary/Chest: She has decreased breath sounds in the right upper field, the right middle field and the right lower field.  Lymphadenopathy:    She has no cervical adenopathy.  Neurological: She is alert and oriented to person, place, and time.  Skin: Skin is warm and dry.          Assessment & Plan:  1. Bronchitis - Will Rx for Doxycycline and albuterol inhaler.  Advised patient to follow up with office if symptoms worsen or do not improve in the next 4 days.

## 2014-10-22 NOTE — Progress Notes (Signed)
Pre visit review using our clinic review tool, if applicable. No additional management support is needed unless otherwise documented below in the visit note. 

## 2014-10-22 NOTE — Patient Instructions (Signed)
Cough, Adult  A cough is a reflex that helps clear your throat and airways. It can help heal the body or may be a reaction to an irritated airway. A cough may only last 2 or 3 weeks (acute) or may last more than 8 weeks (chronic).  CAUSES Acute cough:  Viral or bacterial infections. Chronic cough:  Infections.  Allergies.  Asthma.  Post-nasal drip.  Smoking.  Heartburn or acid reflux.  Some medicines.  Chronic lung problems (COPD).  Cancer. SYMPTOMS   Cough.  Fever.  Chest pain.  Increased breathing rate.  High-pitched whistling sound when breathing (wheezing).  Colored mucus that you cough up (sputum). TREATMENT   A bacterial cough may be treated with antibiotic medicine.  A viral cough must run its course and will not respond to antibiotics.  Your caregiver may recommend other treatments if you have a chronic cough. HOME CARE INSTRUCTIONS   Only take over-the-counter or prescription medicines for pain, discomfort, or fever as directed by your caregiver. Use cough suppressants only as directed by your caregiver.  Use a cold steam vaporizer or humidifier in your bedroom or home to help loosen secretions.  Sleep in a semi-upright position if your cough is worse at night.  Rest as needed.  Stop smoking if you smoke. SEEK IMMEDIATE MEDICAL CARE IF:   You have pus in your sputum.  Your cough starts to worsen.  You cannot control your cough with suppressants and are losing sleep.  You begin coughing up blood.  You have difficulty breathing.  You develop pain which is getting worse or is uncontrolled with medicine.  You have a fever. MAKE SURE YOU:   Understand these instructions.  Will watch your condition.  Will get help right away if you are not doing well or get worse. Document Released: 02/13/2011 Document Revised: 11/09/2011 Document Reviewed: 02/13/2011 Exit[employer] Patient Information 2015 [employer]. This information is not intended  to replace advice given to you by your health care provider. Make sure you discuss any questions you have with your health care provider.  

## 2014-10-22 NOTE — Progress Notes (Signed)
HPI  Pt presents to the clinic today with c/o headache, cough and chest congestion.She reports this started 1 week ago. She has had some associated shortness of breath with her cough. The cough is productive of yellow mucous. She has no history of allergies or breathing problems. She is a smoker x 35 years. She has had sick contacts.  Review of Systems      Past Medical History  Diagnosis Date  . Hyperlipidemia     controlled by diet  . History of anxiety     situational/stress induced, previously on paxil  . Urine incontinence     "difficulty holding urine"  . GERD (gastroesophageal reflux disease)     doing well off meds  . Cervical spine fracture 2008    hx of   . Above knee amputation of left lower extremity 2008    disability since then  . DDD (degenerative disc disease), lumbar 2007    s/p surgery  . Smoker     Family History  Problem Relation Age of Onset  . Hyperlipidemia Mother   . Hyperlipidemia Father   . Cancer Paternal Grandfather     prostate  . Cancer Other     breast (great grandmother)  . CAD Maternal Uncle 40    MI  . Stroke Neg Hx   . Diabetes Neg Hx     History   Social History  . Marital Status: Single    Spouse Name: N/A  . Number of Children: N/A  . Years of Education: N/A   Occupational History  . Not on file.   Social History Main Topics  . Smoking status: Current Every Day Smoker -- 0.25 packs/day for 35 years    Types: Cigarettes  . Smokeless tobacco: Never Used  . Alcohol Use: 0.0 oz/week    0 Standard drinks or equivalent per week     Comment: social  . Drug Use: No  . Sexual Activity: Not on file   Other Topics Concern  . Not on file   Social History Narrative   Lives alone   Edu: HS   Occupation: disability since [place] 2008, was supervisor at [employer]   Activity: no regular exercise   Diet: some water, fruits daily    Allergies  Allergen Reactions  . Codeine Nausea And Vomiting  . Sulfa Antibiotics Other  (See Comments)    Unsure of allergy     Constitutional: Positive headache, fatigue. Denies fever or abrupt weight changes.  HEENT:   Denies eye redness, eye pain, pressure behind the eyes, facial pain, nasal congestion, ear pain, ringing in the ears, wax buildup, runny nose or sore throat. Respiratory: Positive cough and shortness of breath. Denies difficulty breathing.  Cardiovascular: Denies chest pain, chest tightness, palpitations or swelling in the hands or feet.   No other specific complaints in a complete review of systems (except as listed in HPI above).  Objective:   BP 118/72 mmHg  Pulse 73  Temp(Src) 97.5 F (36.4 C) (Oral)  Wt 142 lb (64.411 kg)  SpO2 99% Wt Readings from Last 3 Encounters:  10/22/14 142 lb (64.411 kg)  03/12/14 140 lb 8 oz (63.73 kg)  02/09/14 140 lb 4 oz (63.617 kg)     General: Appears her stated age, well developed, well nourished in NAD. HEENT: Head: normal shape and size; Tm's gray and intact, normal light reflex; Nose: mucosa pink and moist, septum midline; Throat/Mouth: + PND. Teeth present, mucosa pink and moist, no exudate  noted, no lesions or ulcerations noted.  Neck: No cervical lymphadenopathy. Cardiovascular: Normal rate and rhythm. S1,S2 noted.  No murmur, rubs or gallops noted.  Pulmonary/Chest: Normal effort and diminished breath sounds, worse in the RLL. No respiratory distress. No wheezes, rales or ronchi noted.      Assessment & Plan:   Acute Bronchitis:  Get some rest and drink plenty of water eRx for Doxycycline BID x 10 days, reports zpack does not work for her Declines RX Hycodan cough syrup eRx for Albuterol inhaler  RTC as needed or if symptoms persist.

## 2015-01-22 ENCOUNTER — Encounter: Payer: Self-pay | Admitting: *Deleted

## 2015-01-22 ENCOUNTER — Ambulatory Visit (INDEPENDENT_AMBULATORY_CARE_PROVIDER_SITE_OTHER): Payer: Medicare Other | Admitting: Family Medicine

## 2015-01-22 ENCOUNTER — Telehealth: Payer: Self-pay

## 2015-01-22 ENCOUNTER — Encounter: Payer: Self-pay | Admitting: Family Medicine

## 2015-01-22 VITALS — BP 110/76 | HR 85 | Temp 97.9°F | Resp 16 | Wt 140.5 lb

## 2015-01-22 DIAGNOSIS — J209 Acute bronchitis, unspecified: Secondary | ICD-10-CM | POA: Insufficient documentation

## 2015-01-22 DIAGNOSIS — Z720 Tobacco use: Secondary | ICD-10-CM

## 2015-01-22 DIAGNOSIS — F17200 Nicotine dependence, unspecified, uncomplicated: Secondary | ICD-10-CM

## 2015-01-22 DIAGNOSIS — Z79891 Long term (current) use of opiate analgesic: Secondary | ICD-10-CM | POA: Diagnosis not present

## 2015-01-22 DIAGNOSIS — S78112A Complete traumatic amputation at level between left hip and knee, initial encounter: Secondary | ICD-10-CM

## 2015-01-22 DIAGNOSIS — Z89612 Acquired absence of left leg above knee: Secondary | ICD-10-CM | POA: Diagnosis not present

## 2015-01-22 DIAGNOSIS — K219 Gastro-esophageal reflux disease without esophagitis: Secondary | ICD-10-CM | POA: Diagnosis not present

## 2015-01-22 MED ORDER — AMOXICILLIN-POT CLAVULANATE 875-125 MG PO TABS: 1.0000 | ORAL_TABLET | Freq: Two times a day (BID) | ORAL | Status: AC

## 2015-01-22 MED ORDER — HYDROCODONE-ACETAMINOPHEN 5-325 MG PO TABS: 1.0000 | ORAL_TABLET | Freq: Four times a day (QID) | ORAL | Status: DC | PRN

## 2015-01-22 MED ORDER — PANTOPRAZOLE SODIUM 40 MG PO TBEC: 40.0000 mg | DELAYED_RELEASE_TABLET | Freq: Every day | ORAL | Status: DC

## 2015-01-22 NOTE — Assessment & Plan Note (Signed)
In smoker. Discussed supportive care including fluids, rest, mucinex, delsym. States zpack not helpful in the past. Will provide with augmentin course and pt will update [place] if no improvement after treatment. Denies chest pain/tightness, no wheezing on exam today.

## 2015-01-22 NOTE — Telephone Encounter (Signed)
Pt was seen earlier today and is at [address] and there is no abx. In 01/22/15 office note mentions starting augmentin. Pt request augmentin rx called to [address] ASAP and pt request call at [phone] when med has been called in because pt has difficulty in walking and is going to wait on rx since pt is already at pharmacy.

## 2015-01-22 NOTE — Telephone Encounter (Signed)
Sorry - sent in.

## 2015-01-22 NOTE — Assessment & Plan Note (Signed)
Contemplative. Interested in further info. Online resources provided. Handout provided. Down to 1/4 ppd.

## 2015-01-22 NOTE — Assessment & Plan Note (Signed)
Worsening again. Omeprazole ineffective, zantac ineffective. protonix 40mg  daily has been effective up to now. Endorses globus sensation and mild dysphagia - will refer to GI for further eval, would consider EGD to r/o stricture or other cause of breakthrough sxs despite PPI. Pt agrees with plan.

## 2015-01-22 NOTE — Assessment & Plan Note (Signed)
Intermittent pain treated with rare hydrocodone. Refilled today. Controlled substance agreement form discussed, filled out today.

## 2015-01-22 NOTE — Patient Instructions (Addendum)
Pass by Rachel Mcintosh's office to schedule GI appointment for persistent GERD symptoms. In interim increase protonix to 40mg  twice daily. Look at [email or web address] For bronchitis - treat with augmentin course. May use plain mucinex with plenty of fluid or delsym for cough. If shortness of breath persists, or any new chest pains, let us know. Fill out controlled substance agreement form.  Smoking Cessation Quitting smoking is important to your health and has many advantages. However, it is not always easy to quit since nicotine is a very addictive drug. Oftentimes, people try 3 times or more before being able to quit. This document explains the best ways for you to prepare to quit smoking. Quitting takes hard work and a lot of effort, but you can do it. ADVANTAGES OF QUITTING SMOKING  You will live longer, feel better, and live better.  Your body will feel the impact of quitting smoking almost immediately.  Within 20 minutes, blood pressure decreases. Your pulse returns to its normal level.  After 8 hours, carbon monoxide levels in the blood return to normal. Your oxygen level increases.  After 24 hours, the chance of having a heart attack starts to decrease. Your breath, hair, and body stop smelling like smoke.  After 48 hours, damaged nerve endings begin to recover. Your sense of taste and smell improve.  After 72 hours, the body is virtually free of nicotine. Your bronchial tubes relax and breathing becomes easier.  After 2 to 12 weeks, lungs can hold more air. Exercise becomes easier and circulation improves.  The risk of having a heart attack, stroke, cancer, or lung disease is greatly reduced.  After 1 year, the risk of coronary heart disease is cut in half.  After 5 years, the risk of stroke falls to the same as a nonsmoker.  After 10 years, the risk of lung cancer is cut in half and the risk of other cancers decreases significantly.  After 15 years, the risk of coronary heart disease  drops, usually to the level of a nonsmoker.  If you are pregnant, quitting smoking will improve your chances of having a healthy baby.  The people you live with, especially any children, will be healthier.  You will have extra money to spend on things other than cigarettes. QUESTIONS TO THINK ABOUT BEFORE ATTEMPTING TO QUIT You may want to talk about your answers with your health care provider.  Why do you want to quit?  If you tried to quit in the past, what helped and what did not?  What will be the most difficult situations for you after you quit? How will you plan to handle them?  Who can help you through the tough times? Your family? Friends? A health care provider?  What pleasures do you get from smoking? What ways can you still get pleasure if you quit? Here are some questions to ask your health care provider:  How can you help me to be successful at quitting?  What medicine do you think would be best for me and how should I take it?  What should I do if I need more help?  What is smoking withdrawal like? How can I get information on withdrawal? GET READY  Set a quit date.  Change your environment by getting rid of all cigarettes, ashtrays, matches, and lighters in your home, car, or work. Do not let people smoke in your home.  Review your past attempts to quit. Think about what worked and what did not. GET SUPPORT AND ENCOURAGEMENT  You have a better chance of being successful if you have help. You can get support in many ways.  Tell your family, friends, and coworkers that you are going to quit and need their support. Ask them not to smoke around you.  Get individual, group, or telephone counseling and support. Programs are available at [employer] and health centers. Call your local health department for information about programs in your area.  Spiritual beliefs and practices may help some smokers quit.  Download a "quit meter" on your computer to keep track  of quit statistics, such as how long you have gone without smoking, cigarettes not smoked, and money saved.  Get a self-help book about quitting smoking and staying off tobacco. [place] yourself from urges to smoke. Talk to someone, go for a walk, or occupy your time with a task.  Change your normal routine. Take a different route to work. Drink tea instead of coffee. Eat breakfast in a different place.  Reduce your stress. Take a hot bath, exercise, or read a book.  Plan something enjoyable to do every day. Reward yourself for not smoking.  Explore interactive web-based programs that specialize in helping you quit. GET MEDICINE AND USE IT CORRECTLY Medicines can help you stop smoking and decrease the urge to smoke. Combining medicine with the above behavioral methods and support can greatly increase your chances of successfully quitting smoking.  Nicotine replacement therapy helps deliver nicotine to your body without the negative effects and risks of smoking. Nicotine replacement therapy includes nicotine gum, lozenges, inhalers, nasal sprays, and skin patches. Some may be available over-the-counter and others require a prescription.  Antidepressant medicine helps people abstain from smoking, but how this works is unknown. This medicine is available by prescription.  Nicotinic receptor partial agonist medicine simulates the effect of nicotine in your brain. This medicine is available by prescription. Ask your health care provider for advice about which medicines to use and how to use them based on your health history. Your health care provider will tell you what side effects to look out for if you choose to be on a medicine or therapy. Carefully read the information on the package. Do not use any other product containing nicotine while using a nicotine replacement product.  RELAPSE OR DIFFICULT SITUATIONS Most relapses occur within the first 3 months after  quitting. Do not be discouraged if you start smoking again. Remember, most people try several times before finally quitting. You may have symptoms of withdrawal because your body is used to nicotine. You may crave cigarettes, be irritable, feel very hungry, cough often, get headaches, or have difficulty concentrating. The withdrawal symptoms are only temporary. They are strongest when you first quit, but they will go away within 10-14 days. To reduce the chances of relapse, try to:  Avoid drinking alcohol. Drinking lowers your chances of successfully quitting.  Reduce the amount of caffeine you consume. Once you quit smoking, the amount of caffeine in your body increases and can give you symptoms, such as a rapid heartbeat, sweating, and anxiety.  Avoid smokers because they can make you want to smoke.  Do not let weight gain distract you. Many smokers will gain weight when they quit, usually less than 10 pounds. Eat a healthy diet and stay active. You can always lose the weight gained after you quit.  Find ways to improve your mood other than smoking. FOR MORE INFORMATION  www.smokefree.gov  Document Released: 08/11/2001 Document  Revised: 01/01/2014 Document Reviewed: 11/26/2011 [facility] Patient Information 2015 [place], [place]. This information is not intended to replace advice given to you by your health care provider. Make sure you discuss any questions you have with your health care provider.

## 2015-01-22 NOTE — Progress Notes (Signed)
Pre visit review using our clinic review tool, if applicable. No additional management support is needed unless otherwise documented below in the visit note. 

## 2015-01-22 NOTE — Telephone Encounter (Signed)
Patient notified

## 2015-01-22 NOTE — Progress Notes (Signed)
BP 110/76 mmHg  Pulse 85  Temp(Src) 97.9 F (36.6 C) (Oral)  Resp 16  Wt 140 lb 8 oz (63.73 kg)  SpO2 98%   CC: GERD and dyspnea  Subjective:    Patient ID: [name], female    DOB: 09-06-1955, 59 y.o.   MRN: [id number]  HPI: Rachel Mcintosh is a 59 y.o. female presenting on 01/22/2015 for Gastrophageal Reflux; Shortness of Breath; and Nicotine Dependence   Known GERD. Regularly takes protonix 40mg  daily. Worsening despite this. Burping causes food regurgitation. Endorses esophageal dysphagia and chronic globus sensation. She is compliant with GERD diet but ate leftover spaghetti which exacerbated current sxs. Omeprazole has not helped previously. Zantac has not helped previously.  Remote swallow study normal per patient.   1/4 ppd smoker - leery of chantix.   Denies chest pain/tightness.   Notes easy dyspnea with exertion worse over last 2 days. + 1 wk h/o cough, productive of clear mucous. + HA, possible chest congestion.   No fevers/chills, ear or tooth pain, ST, PNdrainage, head, wheezing.   Requests refill of pain medication. Rare use for h/o L AKA.  Relevant past medical, surgical, family and social history reviewed and updated as indicated. Interim medical history since our last visit reviewed. Allergies and medications reviewed and updated. No current outpatient prescriptions on file prior to visit.   No current facility-administered medications on file prior to visit.    Review of Systems Per HPI unless specifically indicated above     Objective:    BP 110/76 mmHg  Pulse 85  Temp(Src) 97.9 F (36.6 C) (Oral)  Resp 16  Wt 140 lb 8 oz (63.73 kg)  SpO2 98%  Wt Readings from Last 3 Encounters:  01/22/15 140 lb 8 oz (63.73 kg)  10/22/14 142 lb (64.411 kg)  03/12/14 140 lb 8 oz (63.73 kg)    Physical Exam  Constitutional: She appears well-developed and well-nourished. No distress.  HENT:  Head: Normocephalic and atraumatic.  Right Ear: Hearing,  tympanic membrane, external ear and ear canal normal.  Left Ear: Hearing, tympanic membrane, external ear and ear canal normal.  Nose: No mucosal edema or rhinorrhea. Right sinus exhibits no maxillary sinus tenderness and no frontal sinus tenderness. Left sinus exhibits no maxillary sinus tenderness and no frontal sinus tenderness.  Mouth/Throat: Uvula is midline, oropharynx is clear and moist and mucous membranes are normal. No oropharyngeal exudate, posterior oropharyngeal edema, posterior oropharyngeal erythema or tonsillar abscesses.  Eyes: Conjunctivae and EOM are normal. Pupils are equal, round, and reactive to light. No scleral icterus.  Neck: Normal range of motion. Neck supple.  Cardiovascular: Normal rate, regular rhythm, normal heart sounds and intact distal pulses.   No murmur heard. Pulmonary/Chest: Effort normal and breath sounds normal. No respiratory distress. She has no wheezes. She has no rales.  Abdominal: Soft. Normal appearance and bowel sounds are normal. She exhibits no distension and no mass. There is no tenderness. There is no rebound, no guarding and no CVA tenderness.  Musculoskeletal: She exhibits no edema.  Lymphadenopathy:    She has no cervical adenopathy.  Skin: Skin is warm and dry. No rash noted.  Nursing note and vitals reviewed.  Results for orders placed or performed in visit on 03/19/14  [facility] MAMMOGRAPHY  Result Value Ref Range   HM Mammogram Bir[place] 2-Normal-Repeat 1 year       Assessment & Plan:   Problem List Items Addressed This Visit    Above knee amputation  of left lower extremity    Intermittent pain treated with rare hydrocodone. Refilled today. Controlled substance agreement form discussed, filled out today.      Acute bronchitis    In smoker. Discussed supportive care including fluids, rest, mucinex, delsym. States zpack not helpful in the past. Will provide with augmentin course and pt will update [place] if no improvement after treatment. Denies  chest pain/tightness, no wheezing on exam today.      GERD (gastroesophageal reflux disease) - Primary    Worsening again. Omeprazole ineffective, zantac ineffective. protonix 40mg  daily has been effective up to now. Endorses globus sensation and mild dysphagia - will refer to GI for further eval, would consider EGD to r/o stricture or other cause of breakthrough sxs despite PPI. Pt agrees with plan.      Relevant Medications   pantoprazole (PROTONIX) 40 MG tablet   Other Relevant Orders   Ambulatory referral to Gastroenterology   Smoker    Contemplative. Interested in further info. Online resources provided. Handout provided. Down to 1/4 ppd.          Follow up plan: Return if symptoms worsen or fail to improve.

## 2015-01-22 NOTE — Telephone Encounter (Signed)
Spoke with Rachel Mcintosh at [facility] and rx almost ready for pick up; notified pt and she was appreciative.

## 2015-01-23 ENCOUNTER — Encounter: Payer: Self-pay | Admitting: Internal Medicine

## 2015-01-31 ENCOUNTER — Telehealth: Payer: Self-pay

## 2015-01-31 MED ORDER — ALBUTEROL SULFATE HFA 108 (90 BASE) MCG/ACT IN AERS: 2.0000 | INHALATION_SPRAY | Freq: Four times a day (QID) | RESPIRATORY_TRACT | Status: DC | PRN

## 2015-01-31 MED ORDER — FLUTICASONE PROPIONATE 50 MCG/ACT NA SUSP: 2.0000 | Freq: Every day | NASAL | Status: DC

## 2015-01-31 NOTE — Telephone Encounter (Signed)
I don't think she needs another antibiotic - possible allergic cough with wheezing - would recommend start flonase+ claritin and I'll send in inhaler for wheezing. Try this for next several days and update [place] if not improving, if fever >101 or worsening productive cough. How is GERD? If persistent sxs recommend dose protonix BID

## 2015-01-31 NOTE — Telephone Encounter (Signed)
Pt left v/m;pt seen 01/22/15 and will finish augmentin on 02/01/15; pt is no worse than when seen but is no better either; pt still congested with slight wheezing that started last night and continues this morning.Pt said she is not having SOB but it is hard for pt to get her breath; no fever. pt request different abx. [place] Pt request cb.

## 2015-01-31 NOTE — Telephone Encounter (Signed)
Patient notified and verbalized understanding. She said GERD sxs are the same. Advised to increase protonix to BID.

## 2015-02-08 ENCOUNTER — Telehealth: Payer: Self-pay | Admitting: Family Medicine

## 2015-02-08 NOTE — Telephone Encounter (Signed)
Spoke with patient and scheduled for Saturday clinic

## 2015-02-08 NOTE — Telephone Encounter (Signed)
[place]    --------------------------------------------------------------------------------   Patient Name: Rachel Mcintosh  Gender: Female  DOB: 1956/06/09   Age: 59 Y 50 M 3 D  Return Phone Number: [phone] (Primary)  Address:     City/State/Zip:       Client [place] [facility] - Client  Client Site [place] - Day  Physician Ria Bush   Contact Type Call  Call Type Triage / Clinical  Relationship To Patient Self  Return Phone Number [phone] (Primary)  Chief Complaint Urination Pain  Initial Comment caller states she has UTI sx  PreDisposition Go to Urgent Care/Walk-In Clinic       Nurse Assessment  Nurse: Amalia Hailey, RN, Melissa Date/Time (Eastern Time): 02/08/2015 3:17:58 PM  Confirm and document reason for call. If symptomatic, describe symptoms. ---caller states she has UTI sx    Has the patient traveled out of the country within the last 30 days? ---Not Applicable    Does the patient require triage? ---Yes    Related visit to physician within the last 2 weeks? ---No    Does the PT have any chronic conditions? (i.e. diabetes, asthma, etc.) ---No           Guidelines          Guideline Title Affirmed Question Affirmed Notes Nurse Date/Time Eilene Ghazi Time)  Urinary Symptoms Urinating more frequently than usual (i.e., frequency)    Amalia Hailey, RN, Melissa 02/08/2015 3:19:12 PM    Disp. Time Eilene Ghazi Time) Disposition Final User    02/08/2015 2:29:13 PM Send To Clinical Follow Up Queue   [place] [facility]    02/08/2015 3:03:12 PM Attempt made - message left   Amalia Hailey, RN, Lenna Sciara      02/08/2015 3:26:16 PM See Physician within 24 Hours Yes Amalia Hailey, RN, Lenna Sciara            Caller Understands: Yes  Disagree/Comply: Comply       Care Advice Given Per Guideline        SEE PHYSICIAN WITHIN 24 HOURS: CALL BACK IF: * You become  worse. * Fever occurs * Unable to urinate and bladder feels full    After Care Instructions Given        Call Event Type User Date / Time Description        --------------------------------------------------------------------------------            Referrals  [facility]

## 2015-02-09 ENCOUNTER — Ambulatory Visit (INDEPENDENT_AMBULATORY_CARE_PROVIDER_SITE_OTHER): Payer: Medicare Other | Admitting: Family Medicine

## 2015-02-09 ENCOUNTER — Encounter: Payer: Self-pay | Admitting: Family Medicine

## 2015-02-09 VITALS — BP 110/70 | HR 86 | Temp 98.4°F | Resp 18 | Ht 66.0 in | Wt 139.5 lb

## 2015-02-09 DIAGNOSIS — R350 Frequency of micturition: Secondary | ICD-10-CM

## 2015-02-09 DIAGNOSIS — R300 Dysuria: Secondary | ICD-10-CM

## 2015-02-09 LAB — POCT URINALYSIS DIPSTICK
BILIRUBIN UA: NEGATIVE
Glucose, UA: NEGATIVE
Ketones, UA: NEGATIVE
Nitrite, UA: POSITIVE
PROTEIN UA: NEGATIVE
Spec Grav, UA: 1.01
UROBILINOGEN UA: 1
pH, UA: 6

## 2015-02-09 MED ORDER — CEPHALEXIN 500 MG PO CAPS: 500.0000 mg | ORAL_CAPSULE | Freq: Three times a day (TID) | ORAL | Status: DC

## 2015-02-09 NOTE — Progress Notes (Signed)
Pre visit review using our clinic review tool, if applicable. No additional management support is needed unless otherwise documented below in the visit note. 

## 2015-02-09 NOTE — Patient Instructions (Signed)
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
## 2015-02-13 ENCOUNTER — Telehealth (INDEPENDENT_AMBULATORY_CARE_PROVIDER_SITE_OTHER): Payer: Medicare Other

## 2015-02-13 DIAGNOSIS — N3289 Other specified disorders of bladder: Secondary | ICD-10-CM | POA: Diagnosis not present

## 2015-02-13 DIAGNOSIS — R3989 Other symptoms and signs involving the genitourinary system: Secondary | ICD-10-CM

## 2015-02-13 DIAGNOSIS — N3001 Acute cystitis with hematuria: Secondary | ICD-10-CM

## 2015-02-13 MED ORDER — CEPHALEXIN 500 MG PO CAPS: 500.0000 mg | ORAL_CAPSULE | Freq: Four times a day (QID) | ORAL | Status: DC

## 2015-02-13 NOTE — Telephone Encounter (Signed)
Patient notified

## 2015-02-13 NOTE — Telephone Encounter (Signed)
let's extend abx for another 3 days - sent in.

## 2015-02-13 NOTE — Telephone Encounter (Signed)
Pt left v/m; pt was seen 02/09/15 at [facility] for [facility]; was given abx; pt is better, no burning or frequency of urine now but still has feeling of pressure. Pt wants to know if needs more abx or what to do? Pt request cb.

## 2015-02-18 ENCOUNTER — Encounter: Payer: Self-pay | Admitting: Family Medicine

## 2015-02-22 ENCOUNTER — Other Ambulatory Visit: Payer: Medicare Other

## 2015-02-22 DIAGNOSIS — N3289 Other specified disorders of bladder: Secondary | ICD-10-CM | POA: Diagnosis not present

## 2015-02-22 DIAGNOSIS — N3001 Acute cystitis with hematuria: Secondary | ICD-10-CM | POA: Diagnosis not present

## 2015-02-22 LAB — POCT URINALYSIS DIPSTICK
Bilirubin, UA: NEGATIVE
GLUCOSE UA: NEGATIVE
Ketones, UA: NEGATIVE
Nitrite, UA: POSITIVE
Protein, UA: NEGATIVE
SPEC GRAV UA: 1.015
UROBILINOGEN UA: 1
pH, UA: 6

## 2015-02-22 MED ORDER — CEPHALEXIN 500 MG PO CAPS: 500.0000 mg | ORAL_CAPSULE | Freq: Two times a day (BID) | ORAL | Status: DC

## 2015-02-22 NOTE — Addendum Note (Signed)
Addended by: Ria Bush on: 02/22/2015 01:36 PM   Modules accepted: Orders

## 2015-02-22 NOTE — Telephone Encounter (Signed)
UA done and results in chart. UCx sent. Patient notified to go ahead and start the abx that were sent in and she would be notified on Monday of UCx results. She verbalized understanding.

## 2015-02-22 NOTE — Telephone Encounter (Addendum)
Pt left v/m; pt seen 02/09/15 and abx extended on 02/13/15; pt has been out of med for 4 days and has the feeling of pressure again. Pt wants to know if can get med without being seen. Pt request [email or web address].

## 2015-02-22 NOTE — Telephone Encounter (Signed)
Recommend she come in today for lab visit to collect UA/urine culture to send off given persistent sxs despite treatment. Then may start antibiotic sent to pharmacy, another 7d course.

## 2015-02-22 NOTE — Addendum Note (Signed)
Addended by: Royann Shivers A on: 02/22/2015 03:25 PM   Modules accepted: Orders

## 2015-02-22 NOTE — Telephone Encounter (Signed)
Pt called back to see what she needed to do.  i read dr g note to pt Pt made appointment for labs 6/24

## 2015-02-24 LAB — URINE CULTURE: Colony Count: 7000

## 2015-03-27 ENCOUNTER — Ambulatory Visit (INDEPENDENT_AMBULATORY_CARE_PROVIDER_SITE_OTHER): Payer: Medicare Other | Admitting: Internal Medicine

## 2015-03-27 ENCOUNTER — Encounter: Payer: Self-pay | Admitting: Internal Medicine

## 2015-03-27 VITALS — BP 125/86 | HR 72 | Ht 65.75 in | Wt 140.4 lb

## 2015-03-27 DIAGNOSIS — R1013 Epigastric pain: Secondary | ICD-10-CM

## 2015-03-27 DIAGNOSIS — R1310 Dysphagia, unspecified: Secondary | ICD-10-CM

## 2015-03-27 DIAGNOSIS — R12 Heartburn: Secondary | ICD-10-CM | POA: Diagnosis not present

## 2015-03-27 NOTE — Patient Instructions (Signed)
You have been scheduled for an endoscopy. Please follow written instructions given to you at your visit today. If you use inhalers (even only as needed), please bring them with you on the day of your procedure.  I appreciate the opportunity to care for you. Gatha Mayer, MD, [facility]
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
## 2015-04-01 HISTORY — PX: ESOPHAGOGASTRODUODENOSCOPY: SHX1529

## 2015-04-16 ENCOUNTER — Encounter: Payer: Self-pay | Admitting: Internal Medicine

## 2015-04-16 ENCOUNTER — Ambulatory Visit (AMBULATORY_SURGERY_CENTER): Payer: Medicare Other | Admitting: Internal Medicine

## 2015-04-16 VITALS — BP 108/71 | HR 62 | Temp 98.8°F | Resp 16 | Wt 140.0 lb

## 2015-04-16 DIAGNOSIS — R1310 Dysphagia, unspecified: Secondary | ICD-10-CM

## 2015-04-16 DIAGNOSIS — K222 Esophageal obstruction: Secondary | ICD-10-CM

## 2015-04-16 MED ORDER — SODIUM CHLORIDE 0.9 % IV SOLN: 500.0000 mL | INTRAVENOUS | Status: DC

## 2015-04-16 NOTE — Patient Instructions (Signed)
YOU HAD AN ENDOSCOPIC PROCEDURE TODAY AT [facility]:   Refer to the procedure report that was given to you for any specific questions about what was found during the examination.  If the procedure report does not answer your questions, please call your gastroenterologist to clarify.  If you requested that your care partner not be given the details of your procedure findings, then the procedure report has been included in a sealed envelope for you to review at your convenience later.  YOU SHOULD EXPECT: Some feelings of bloating in the abdomen. Passage of more gas than usual.  Walking can help get rid of the air that was put into your GI tract during the procedure and reduce the bloating. If you had a lower endoscopy (such as a colonoscopy or flexible sigmoidoscopy) you may notice spotting of blood in your stool or on the toilet paper. If you underwent a bowel prep for your procedure, you may not have a normal bowel movement for a few days.  Please Note:  You might notice some irritation and congestion in your nose or some drainage.  This is from the oxygen used during your procedure.  There is no need for concern and it should clear up in a day or so.  SYMPTOMS TO REPORT IMMEDIATELY:   Following upper endoscopy (EGD)  Vomiting of blood or coffee ground material  New chest pain or pain under the shoulder blades  Painful or persistently difficult swallowing  New shortness of breath  Fever of 100F or higher  Black, tarry-looking stools  For urgent or emergent issues, a gastroenterologist can be reached at any hour by calling [phone].   DIET: Your first meal following the procedure should be a small meal and then it is ok to progress to your normal diet. Heavy or fried foods are harder to digest and may make you feel nauseous or bloated.  Likewise, meals heavy in dairy and vegetables can increase bloating.  Drink plenty of fluids but you should avoid alcoholic beverages for  24 hours.  ACTIVITY:  You should plan to take it easy for the rest of today and you should NOT DRIVE or use heavy machinery until tomorrow (because of the sedation medicines used during the test).    FOLLOW UP: Our staff will call the number listed on your records the next business day following your procedure to check on you and address any questions or concerns that you may have regarding the information given to you following your procedure. If we do not reach you, we will leave a message.  However, if you are feeling well and you are not experiencing any problems, there is no need to return our call.  We will assume that you have returned to your regular daily activities without incident.  If any biopsies were taken you will be contacted by phone or by letter within the next 1-3 weeks.  Please call us at [phone] if you have not heard about the biopsies in 3 weeks.    SIGNATURES/CONFIDENTIALITY: You and/or your care partner have signed paperwork which will be entered into your electronic medical record.  These signatures attest to the fact that that the information above on your After Visit Summary has been reviewed and is understood.  Full responsibility of the confidentiality of this discharge information lies with you and/or your care-partner.  Clear liquid until 9:00AM, soft foods rest of today.   Resume regular diet tomorrow. Call me in 1-2 months  for advice if problems persist.

## 2015-04-16 NOTE — Progress Notes (Signed)
Called to room to assist during endoscopic procedure.  Patient ID and intended procedure confirmed with present staff. Received instructions for my participation in the procedure from the performing physician.  

## 2015-04-16 NOTE — Op Note (Signed)
[facility]  [address] [place], [id number]   ENDOSCOPY PROCEDURE REPORT  PATIENT: Rachel, Mcintosh  MR#: [id number] BIRTHDATE: March 15, 1956 , 48  yrs. old GENDER: female ENDOSCOPIST: Gatha Mayer, MD, [facility] PROCEDURE DATE:  04/16/2015 PROCEDURE:  EGD w/ balloon dilation ASA CLASS:     Class I INDICATIONS:  dysphagia.  regurgitation MEDICATIONS: Propofol 175 mg IV and Monitored anesthesia care TOPICAL ANESTHETIC: none  DESCRIPTION OF PROCEDURE: After the risks benefits and alternatives of the procedure were thoroughly explained, informed consent was obtained.  The LB [id number] endoscope was introduced through the mouth and advanced to the second portion of the duodenum , Without limitations.  The instrument was slowly withdrawn as the mucosa was fully examined.    Lower esophageal ring and 2 cm hiatal hernia.  Otherwise normal EGD. Ring dilated to 18,19 and 20 mm with balloon.  No heme. Retroflexed views revealed as previously described.     The scope was then withdrawn from the patient and the procedure completed.  COMPLICATIONS: There were no immediate complications.  ENDOSCOPIC IMPRESSION: Lower esophageal ring and 2 cm hiatal hernia.  Otherwise normal EGD. Ring dilated to 18,19 and 20 mm with balloon.  No heme  RECOMMENDATIONS: 1.  Clear liquids until 0900, then soft foods rest of day.  Resume prior diet tomorrow. 2.  If still having same problems after 1-2 months call back for advice.   eSigned:  Gatha Mayer, MD, [facility] 04/16/2015 7:52 AM    CC: Dr. Ria Bush, The Patient

## 2015-04-16 NOTE — Progress Notes (Signed)
Transferred to recovery room. A/O x3, pleased with MAC.  VSS.  Report to Jane, RN. 

## 2015-04-17 ENCOUNTER — Encounter: Payer: Self-pay | Admitting: Family Medicine

## 2015-04-17 ENCOUNTER — Telehealth: Payer: Self-pay | Admitting: *Deleted

## 2015-04-17 NOTE — Telephone Encounter (Signed)
  Follow up Call-  Call back number 04/16/2015  Post procedure Call Back phone  # [phone]  Permission to leave phone message Yes     Patient questions:  Do you have a fever, pain , or abdominal swelling? No. Pain Score  0 *  Have you tolerated food without any problems? Yes.    Have you been able to return to your normal activities? Yes.    Do you have any questions about your discharge instructions: Diet   No. Medications  No. Follow up visit  No.  Do you have questions or concerns about your Care? No.  Actions: * If pain score is 4 or above: No action needed, pain <4.

## 2015-06-05 DIAGNOSIS — M25552 Pain in left hip: Secondary | ICD-10-CM | POA: Diagnosis not present

## 2015-06-05 DIAGNOSIS — Z89612 Acquired absence of left leg above knee: Secondary | ICD-10-CM | POA: Diagnosis not present

## 2015-07-26 ENCOUNTER — Inpatient Hospital Stay (HOSPITAL_COMMUNITY)
Admission: EM | Admit: 2015-07-26 | Discharge: 2015-07-31 | DRG: 871 | Disposition: A | Discharge status: Home / Self Care | Payer: Medicare Other | Attending: Internal Medicine | Admitting: Internal Medicine

## 2015-07-26 ENCOUNTER — Encounter (HOSPITAL_COMMUNITY): Payer: Self-pay | Admitting: Emergency Medicine

## 2015-07-26 ENCOUNTER — Emergency Department (HOSPITAL_COMMUNITY): Payer: Medicare Other

## 2015-07-26 DIAGNOSIS — J9691 Respiratory failure, unspecified with hypoxia: Secondary | ICD-10-CM | POA: Diagnosis not present

## 2015-07-26 DIAGNOSIS — R0902 Hypoxemia: Secondary | ICD-10-CM | POA: Diagnosis not present

## 2015-07-26 DIAGNOSIS — A419 Sepsis, unspecified organism: Principal | ICD-10-CM | POA: Diagnosis present

## 2015-07-26 DIAGNOSIS — R0602 Shortness of breath: Secondary | ICD-10-CM | POA: Diagnosis not present

## 2015-07-26 DIAGNOSIS — J441 Chronic obstructive pulmonary disease with (acute) exacerbation: Secondary | ICD-10-CM | POA: Insufficient documentation

## 2015-07-26 DIAGNOSIS — D649 Anemia, unspecified: Secondary | ICD-10-CM | POA: Diagnosis present

## 2015-07-26 DIAGNOSIS — R071 Chest pain on breathing: Secondary | ICD-10-CM

## 2015-07-26 DIAGNOSIS — J470 Bronchiectasis with acute lower respiratory infection: Secondary | ICD-10-CM | POA: Diagnosis present

## 2015-07-26 DIAGNOSIS — I959 Hypotension, unspecified: Secondary | ICD-10-CM | POA: Diagnosis present

## 2015-07-26 DIAGNOSIS — D72829 Elevated white blood cell count, unspecified: Secondary | ICD-10-CM | POA: Diagnosis present

## 2015-07-26 DIAGNOSIS — E876 Hypokalemia: Secondary | ICD-10-CM | POA: Diagnosis present

## 2015-07-26 DIAGNOSIS — J189 Pneumonia, unspecified organism: Secondary | ICD-10-CM | POA: Diagnosis present

## 2015-07-26 DIAGNOSIS — M25512 Pain in left shoulder: Secondary | ICD-10-CM | POA: Diagnosis not present

## 2015-07-26 DIAGNOSIS — I272 Other secondary pulmonary hypertension: Secondary | ICD-10-CM | POA: Diagnosis not present

## 2015-07-26 DIAGNOSIS — R0682 Tachypnea, not elsewhere classified: Secondary | ICD-10-CM | POA: Diagnosis not present

## 2015-07-26 DIAGNOSIS — R000 Tachycardia, unspecified: Secondary | ICD-10-CM | POA: Diagnosis not present

## 2015-07-26 DIAGNOSIS — Z89612 Acquired absence of left leg above knee: Secondary | ICD-10-CM

## 2015-07-26 DIAGNOSIS — J471 Bronchiectasis with (acute) exacerbation: Secondary | ICD-10-CM | POA: Insufficient documentation

## 2015-07-26 DIAGNOSIS — R52 Pain, unspecified: Secondary | ICD-10-CM | POA: Diagnosis not present

## 2015-07-26 DIAGNOSIS — K219 Gastro-esophageal reflux disease without esophagitis: Secondary | ICD-10-CM | POA: Insufficient documentation

## 2015-07-26 LAB — BASIC METABOLIC PANEL
ANION GAP: 10 (ref 5–15)
BUN: 11 mg/dL (ref 6–20)
CO2: 25 mmol/L (ref 22–32)
Calcium: 9.3 mg/dL (ref 8.9–10.3)
Chloride: 97 mmol/L — ABNORMAL LOW (ref 101–111)
Creatinine, Ser: 0.72 mg/dL (ref 0.44–1.00)
GLUCOSE: 128 mg/dL — AB (ref 65–99)
POTASSIUM: 4 mmol/L (ref 3.5–5.1)
Sodium: 132 mmol/L — ABNORMAL LOW (ref 135–145)

## 2015-07-26 LAB — I-STAT CHEM 8, ED
BUN: 13 mg/dL (ref 6–20)
CREATININE: 0.6 mg/dL (ref 0.44–1.00)
Calcium, Ion: 1.2 mmol/L (ref 1.12–1.23)
Chloride: 99 mmol/L — ABNORMAL LOW (ref 101–111)
Glucose, Bld: 124 mg/dL — ABNORMAL HIGH (ref 65–99)
HEMATOCRIT: 46 % (ref 36.0–46.0)
HEMOGLOBIN: 15.6 g/dL — AB (ref 12.0–15.0)
POTASSIUM: 4.1 mmol/L (ref 3.5–5.1)
SODIUM: 138 mmol/L (ref 135–145)
TCO2: 26 mmol/L (ref 0–100)

## 2015-07-26 LAB — CBC
HEMATOCRIT: 43.3 % (ref 36.0–46.0)
HEMOGLOBIN: 14.2 g/dL (ref 12.0–15.0)
MCH: 28.6 pg (ref 26.0–34.0)
MCHC: 32.8 g/dL (ref 30.0–36.0)
MCV: 87.3 fL (ref 78.0–100.0)
Platelets: 301 10*3/uL (ref 150–400)
RBC: 4.96 MIL/uL (ref 3.87–5.11)
RDW: 12.8 % (ref 11.5–15.5)
WBC: 15.4 10*3/uL — ABNORMAL HIGH (ref 4.0–10.5)

## 2015-07-26 LAB — I-STAT TROPONIN, ED: Troponin i, poc: 0 ng/mL (ref 0.00–0.08)

## 2015-07-26 LAB — D-DIMER, QUANTITATIVE: D-Dimer, Quant: 2.41 ug/mL-FEU — ABNORMAL HIGH (ref 0.00–0.50)

## 2015-07-26 MED ORDER — IOHEXOL 350 MG/ML SOLN
100.0000 mL | Freq: Once | INTRAVENOUS | Status: AC | PRN
  Administered 2015-07-26: 100 mL via INTRAVENOUS

## 2015-07-26 MED ORDER — KETOROLAC TROMETHAMINE 30 MG/ML IJ SOLN
30.0000 mg | Freq: Once | INTRAMUSCULAR | Status: AC
  Administered 2015-07-27: 30 mg via INTRAVENOUS
  Filled 2015-07-26: qty 1

## 2015-07-26 MED ORDER — DEXAMETHASONE SODIUM PHOSPHATE 10 MG/ML IJ SOLN
10.0000 mg | Freq: Once | INTRAMUSCULAR | Status: AC
  Administered 2015-07-27: 10 mg via INTRAMUSCULAR
  Filled 2015-07-26: qty 1

## 2015-07-26 MED ORDER — HYDROMORPHONE HCL 1 MG/ML IJ SOLN
1.0000 mg | Freq: Once | INTRAMUSCULAR | Status: AC
  Administered 2015-07-27: 1 mg via INTRAVENOUS
  Filled 2015-07-26: qty 1

## 2015-07-26 MED ORDER — MORPHINE SULFATE (PF) 2 MG/ML IV SOLN
2.0000 mg | Freq: Once | INTRAVENOUS | Status: DC
  Filled 2015-07-26: qty 1

## 2015-07-26 MED ORDER — MORPHINE SULFATE (PF) 2 MG/ML IV SOLN
2.0000 mg | Freq: Once | INTRAVENOUS | Status: AC
  Administered 2015-07-26: 2 mg via INTRAVENOUS
  Filled 2015-07-26: qty 1

## 2015-07-26 MED ORDER — ASPIRIN 81 MG PO CHEW
324.0000 mg | CHEWABLE_TABLET | Freq: Once | ORAL | Status: AC
  Administered 2015-07-26: 324 mg via ORAL
  Filled 2015-07-26: qty 4

## 2015-07-26 NOTE — ED Notes (Signed)
Per eMS:  Pt c/o L shoulder pain that radiates to left breast and across her back to her right side.  Pt describes it as sharp/squeezing.  Pt denies diaphoresis, n/v or cardiac history.  Pt took a hydrocodone before EMS arrival without alleviation of her pain.  Pt denies recent cough, injuries and still has her gallbladder.  Pt hypertensive during transport (180/90)

## 2015-07-26 NOTE — ED Notes (Signed)
MD at bedside at this time.
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
## 2015-07-26 NOTE — ED Notes (Signed)
CT aware pt ready for transport.  

## 2015-07-27 ENCOUNTER — Encounter (HOSPITAL_COMMUNITY): Payer: Self-pay

## 2015-07-27 DIAGNOSIS — I959 Hypotension, unspecified: Secondary | ICD-10-CM | POA: Diagnosis present

## 2015-07-27 DIAGNOSIS — D72829 Elevated white blood cell count, unspecified: Secondary | ICD-10-CM | POA: Diagnosis not present

## 2015-07-27 DIAGNOSIS — E876 Hypokalemia: Secondary | ICD-10-CM | POA: Diagnosis not present

## 2015-07-27 DIAGNOSIS — J189 Pneumonia, unspecified organism: Secondary | ICD-10-CM | POA: Diagnosis not present

## 2015-07-27 DIAGNOSIS — R071 Chest pain on breathing: Secondary | ICD-10-CM | POA: Diagnosis not present

## 2015-07-27 DIAGNOSIS — I9589 Other hypotension: Secondary | ICD-10-CM | POA: Diagnosis not present

## 2015-07-27 DIAGNOSIS — R0902 Hypoxemia: Secondary | ICD-10-CM | POA: Insufficient documentation

## 2015-07-27 DIAGNOSIS — R079 Chest pain, unspecified: Secondary | ICD-10-CM | POA: Diagnosis not present

## 2015-07-27 DIAGNOSIS — A419 Sepsis, unspecified organism: Principal | ICD-10-CM

## 2015-07-27 DIAGNOSIS — J470 Bronchiectasis with acute lower respiratory infection: Secondary | ICD-10-CM | POA: Diagnosis present

## 2015-07-27 DIAGNOSIS — D649 Anemia, unspecified: Secondary | ICD-10-CM | POA: Diagnosis present

## 2015-07-27 DIAGNOSIS — J9811 Atelectasis: Secondary | ICD-10-CM | POA: Diagnosis not present

## 2015-07-27 DIAGNOSIS — J441 Chronic obstructive pulmonary disease with (acute) exacerbation: Secondary | ICD-10-CM | POA: Diagnosis not present

## 2015-07-27 DIAGNOSIS — J9691 Respiratory failure, unspecified with hypoxia: Secondary | ICD-10-CM | POA: Diagnosis not present

## 2015-07-27 DIAGNOSIS — Z89612 Acquired absence of left leg above knee: Secondary | ICD-10-CM | POA: Diagnosis not present

## 2015-07-27 DIAGNOSIS — K219 Gastro-esophageal reflux disease without esophagitis: Secondary | ICD-10-CM | POA: Diagnosis present

## 2015-07-27 DIAGNOSIS — I272 Other secondary pulmonary hypertension: Secondary | ICD-10-CM | POA: Diagnosis not present

## 2015-07-27 HISTORY — DX: Pneumonia, unspecified organism: J18.9

## 2015-07-27 LAB — CBC
HEMATOCRIT: 36 % (ref 36.0–46.0)
HEMOGLOBIN: 12.2 g/dL (ref 12.0–15.0)
MCH: 29.4 pg (ref 26.0–34.0)
MCHC: 33.9 g/dL (ref 30.0–36.0)
MCV: 86.7 fL (ref 78.0–100.0)
Platelets: 234 10*3/uL (ref 150–400)
RBC: 4.15 MIL/uL (ref 3.87–5.11)
RDW: 12.9 % (ref 11.5–15.5)
WBC: 14.5 10*3/uL — ABNORMAL HIGH (ref 4.0–10.5)

## 2015-07-27 LAB — BLOOD GAS, ARTERIAL
ACID-BASE DEFICIT: 7.7 mmol/L — AB (ref 0.0–2.0)
Bicarbonate: 17.1 mEq/L — ABNORMAL LOW (ref 20.0–24.0)
Drawn by: 10552
O2 CONTENT: 2 L/min
O2 Saturation: 97.1 %
PATIENT TEMPERATURE: 98.6
PCO2 ART: 33.4 mmHg — AB (ref 35.0–45.0)
PO2 ART: 93.4 mmHg (ref 80.0–100.0)
TCO2: 18.1 mmol/L (ref 0–100)
pH, Arterial: 7.329 — ABNORMAL LOW (ref 7.350–7.450)

## 2015-07-27 LAB — LACTIC ACID, PLASMA
LACTIC ACID, VENOUS: 2.1 mmol/L — AB (ref 0.5–2.0)
Lactic Acid, Venous: 0.8 mmol/L (ref 0.5–2.0)
Lactic Acid, Venous: 2.9 mmol/L (ref 0.5–2.0)
Lactic Acid, Venous: 1.5 mmol/L (ref 0.5–2.0)

## 2015-07-27 LAB — CBC WITH DIFFERENTIAL/PLATELET
BASOS ABS: 0.1 10*3/uL (ref 0.0–0.1)
BASOS PCT: 1 %
Eosinophils Absolute: 0 10*3/uL (ref 0.0–0.7)
Eosinophils Relative: 0 %
LYMPHS PCT: 7 %
Lymphs Abs: 1.1 10*3/uL (ref 0.7–4.0)
MONO ABS: 0.7 10*3/uL (ref 0.1–1.0)
MONOS PCT: 5 %
NEUTROS ABS: 14.1 10*3/uL — AB (ref 1.7–7.7)
NEUTROS PCT: 87 %

## 2015-07-27 LAB — COMPREHENSIVE METABOLIC PANEL
ALK PHOS: 43 U/L (ref 38–126)
ALT: 12 U/L — ABNORMAL LOW (ref 14–54)
ALT: 13 U/L — ABNORMAL LOW (ref 14–54)
ANION GAP: 8 (ref 5–15)
AST: 17 U/L (ref 15–41)
AST: 15 U/L (ref 15–41)
Albumin: 3 g/dL — ABNORMAL LOW (ref 3.5–5.0)
Albumin: 2.8 g/dL — ABNORMAL LOW (ref 3.5–5.0)
Alkaline Phosphatase: 40 U/L (ref 38–126)
Anion gap: 6 (ref 5–15)
BILIRUBIN TOTAL: 0.5 mg/dL (ref 0.3–1.2)
BILIRUBIN TOTAL: 0.5 mg/dL (ref 0.3–1.2)
BUN: 7 mg/dL (ref 6–20)
BUN: 6 mg/dL (ref 6–20)
CALCIUM: 7.8 mg/dL — AB (ref 8.9–10.3)
CALCIUM: 8.2 mg/dL — AB (ref 8.9–10.3)
CO2: 18 mmol/L — ABNORMAL LOW (ref 22–32)
CO2: 20 mmol/L — ABNORMAL LOW (ref 22–32)
Chloride: 110 mmol/L (ref 101–111)
Chloride: 112 mmol/L — ABNORMAL HIGH (ref 101–111)
Creatinine, Ser: 0.63 mg/dL (ref 0.44–1.00)
Creatinine, Ser: 0.57 mg/dL (ref 0.44–1.00)
GFR calc Af Amer: >60 mL/min (ref >60)
GFR calc non Af Amer: >60 mL/min (ref >60)
Glucose, Bld: 183 mg/dL — ABNORMAL HIGH (ref 65–99)
Glucose, Bld: 214 mg/dL — ABNORMAL HIGH (ref 65–99)
POTASSIUM: 3.3 mmol/L — AB (ref 3.5–5.1)
Potassium: 3.5 mmol/L (ref 3.5–5.1)
Sodium: 136 mmol/L (ref 135–145)
Sodium: 138 mmol/L (ref 135–145)
TOTAL PROTEIN: 5.3 g/dL — AB (ref 6.5–8.1)
TOTAL PROTEIN: 5.3 g/dL — AB (ref 6.5–8.1)

## 2015-07-27 LAB — BASIC METABOLIC PANEL
ANION GAP: 5 (ref 5–15)
BUN: 7 mg/dL (ref 6–20)
CALCIUM: 7.8 mg/dL — AB (ref 8.9–10.3)
CO2: 19 mmol/L — AB (ref 22–32)
Chloride: 113 mmol/L — ABNORMAL HIGH (ref 101–111)
Creatinine, Ser: 0.6 mg/dL (ref 0.44–1.00)
Glucose, Bld: 144 mg/dL — ABNORMAL HIGH (ref 65–99)
POTASSIUM: 3.5 mmol/L (ref 3.5–5.1)
Sodium: 137 mmol/L (ref 135–145)

## 2015-07-27 LAB — PROCALCITONIN: PROCALCITONIN: 0.39 ng/mL

## 2015-07-27 LAB — PROTIME-INR
INR: 1.2 (ref 0.00–1.49)
PROTHROMBIN TIME: 15.4 s — AB (ref 11.6–15.2)

## 2015-07-27 LAB — MRSA PCR SCREENING: MRSA BY PCR: NEGATIVE

## 2015-07-27 LAB — MAGNESIUM
MAGNESIUM: 2.7 mg/dL — AB (ref 1.7–2.4)
Magnesium: 1.6 mg/dL — ABNORMAL LOW (ref 1.7–2.4)

## 2015-07-27 LAB — APTT: APTT: 32 s (ref 24–37)

## 2015-07-27 MED ORDER — DEXTROSE 5 % IV SOLN
500.0000 mg | INTRAVENOUS | Status: DC
  Administered 2015-07-27 – 2015-07-30 (×5): 500 mg via INTRAVENOUS
  Filled 2015-07-27 (×6): qty 500

## 2015-07-27 MED ORDER — HYDROMORPHONE HCL 1 MG/ML IJ SOLN
0.5000 mg | INTRAMUSCULAR | Status: DC | PRN
  Administered 2015-07-29 – 2015-07-30 (×2): 1 mg via INTRAVENOUS
  Administered 2015-07-31: 0.5 mg via INTRAVENOUS
  Filled 2015-07-27 (×4): qty 1

## 2015-07-27 MED ORDER — SODIUM CHLORIDE 0.9 % IV SOLN
INTRAVENOUS | Status: DC
  Administered 2015-07-27: 75 mL/h via INTRAVENOUS
  Administered 2015-07-29: 1000 mL via INTRAVENOUS
  Administered 2015-07-30 – 2015-07-31 (×2): via INTRAVENOUS

## 2015-07-27 MED ORDER — SODIUM CHLORIDE 0.9 % IV BOLUS (SEPSIS)
750.0000 mL | Freq: Once | INTRAVENOUS | Status: AC
  Administered 2015-07-27: 750 mL via INTRAVENOUS

## 2015-07-27 MED ORDER — METHYLPREDNISOLONE SODIUM SUCC 125 MG IJ SOLR
60.0000 mg | INTRAMUSCULAR | Status: DC
  Administered 2015-07-27 – 2015-07-29 (×3): 60 mg via INTRAVENOUS
  Filled 2015-07-27 (×3): qty 2

## 2015-07-27 MED ORDER — IPRATROPIUM-ALBUTEROL 0.5-2.5 (3) MG/3ML IN SOLN
3.0000 mL | Freq: Four times a day (QID) | RESPIRATORY_TRACT | Status: DC
  Administered 2015-07-27 – 2015-07-28 (×7): 3 mL via RESPIRATORY_TRACT
  Filled 2015-07-27 (×6): qty 3

## 2015-07-27 MED ORDER — VANCOMYCIN HCL 10 G IV SOLR
1250.0000 mg | Freq: Once | INTRAVENOUS | Status: AC
  Administered 2015-07-27: 1250 mg via INTRAVENOUS
  Filled 2015-07-27: qty 1250

## 2015-07-27 MED ORDER — ACETAMINOPHEN 650 MG RE SUPP: 650.0000 mg | Freq: Four times a day (QID) | RECTAL | Status: DC | PRN

## 2015-07-27 MED ORDER — SODIUM CHLORIDE 0.9 % IV BOLUS (SEPSIS)
2000.0000 mL | Freq: Once | INTRAVENOUS | Status: AC
  Administered 2015-07-27: 2000 mL via INTRAVENOUS

## 2015-07-27 MED ORDER — VANCOMYCIN HCL IN DEXTROSE 1-5 GM/200ML-% IV SOLN
1000.0000 mg | Freq: Two times a day (BID) | INTRAVENOUS | Status: DC
  Administered 2015-07-27: 1000 mg via INTRAVENOUS
  Filled 2015-07-27 (×2): qty 200

## 2015-07-27 MED ORDER — SODIUM CHLORIDE 0.9 % IJ SOLN
3.0000 mL | Freq: Two times a day (BID) | INTRAMUSCULAR | Status: DC
  Administered 2015-07-27 – 2015-07-30 (×7): 3 mL via INTRAVENOUS

## 2015-07-27 MED ORDER — ONDANSETRON HCL 4 MG PO TABS: 4.0000 mg | ORAL_TABLET | Freq: Four times a day (QID) | ORAL | Status: DC | PRN

## 2015-07-27 MED ORDER — PIPERACILLIN-TAZOBACTAM 3.375 G IVPB
3.3750 g | Freq: Three times a day (TID) | INTRAVENOUS | Status: DC
  Administered 2015-07-27 (×2): 3.375 g via INTRAVENOUS
  Filled 2015-07-27 (×4): qty 50

## 2015-07-27 MED ORDER — SODIUM CHLORIDE 0.9 % IV BOLUS (SEPSIS)
1000.0000 mL | Freq: Once | INTRAVENOUS | Status: AC
  Administered 2015-07-27: 1000 mL via INTRAVENOUS

## 2015-07-27 MED ORDER — ONDANSETRON HCL 4 MG/2ML IJ SOLN
4.0000 mg | Freq: Once | INTRAMUSCULAR | Status: AC
  Administered 2015-07-27: 4 mg via INTRAVENOUS
  Filled 2015-07-27: qty 2

## 2015-07-27 MED ORDER — MAGNESIUM SULFATE 50 % IJ SOLN
3.0000 g | Freq: Once | INTRAVENOUS | Status: AC
  Administered 2015-07-27: 3 g via INTRAVENOUS
  Filled 2015-07-27: qty 6

## 2015-07-27 MED ORDER — DEXTROSE 5 % IV SOLN
1.0000 g | Freq: Once | INTRAVENOUS | Status: AC
  Administered 2015-07-27: 1 g via INTRAVENOUS
  Filled 2015-07-27: qty 10

## 2015-07-27 MED ORDER — ALUM & MAG HYDROXIDE-SIMETH 200-200-20 MG/5ML PO SUSP
30.0000 mL | Freq: Four times a day (QID) | ORAL | Status: DC | PRN
Start: 2015-07-27 — End: 2015-07-31

## 2015-07-27 MED ORDER — SODIUM CHLORIDE 0.9 % IJ SOLN: 3.0000 mL | INTRAMUSCULAR | Status: DC | PRN

## 2015-07-27 MED ORDER — ONDANSETRON HCL 4 MG/2ML IJ SOLN: 4.0000 mg | Freq: Four times a day (QID) | INTRAMUSCULAR | Status: DC | PRN

## 2015-07-27 MED ORDER — HYDROCODONE-ACETAMINOPHEN 5-325 MG PO TABS
1.0000 | ORAL_TABLET | Freq: Four times a day (QID) | ORAL | Status: DC | PRN
Start: 2015-07-27 — End: 2015-07-31

## 2015-07-27 MED ORDER — DM-GUAIFENESIN ER 30-600 MG PO TB12
1.0000 | ORAL_TABLET | Freq: Two times a day (BID) | ORAL | Status: DC
  Administered 2015-07-27 – 2015-07-31 (×9): 1 via ORAL
  Filled 2015-07-27 (×9): qty 1

## 2015-07-27 MED ORDER — ENOXAPARIN SODIUM 40 MG/0.4ML ~~LOC~~ SOLN
40.0000 mg | Freq: Every day | SUBCUTANEOUS | Status: DC
  Administered 2015-07-27 – 2015-07-31 (×5): 40 mg via SUBCUTANEOUS
  Filled 2015-07-27 (×5): qty 0.4

## 2015-07-27 MED ORDER — ACETAMINOPHEN 325 MG PO TABS: 650.0000 mg | ORAL_TABLET | Freq: Four times a day (QID) | ORAL | Status: DC | PRN

## 2015-07-27 MED ORDER — OXYCODONE HCL 5 MG PO TABS
5.0000 mg | ORAL_TABLET | ORAL | Status: DC | PRN
  Administered 2015-07-27 – 2015-07-30 (×12): 5 mg via ORAL
  Filled 2015-07-27 (×12): qty 1

## 2015-07-27 NOTE — ED Notes (Signed)
Pt's BP 91/57, rechecked at 87/50, Dilaudid held and MD notified

## 2015-07-27 NOTE — Progress Notes (Signed)
ANTIBIOTIC CONSULT NOTE - INITIAL  Pharmacy Consult for Vancomycin and Zosyn  Indication: rule out sepsis  Allergies  Allergen Reactions  . Codeine Nausea And Vomiting  . Doxycycline Other (See Comments)    Worsened GERD  . Sulfa Antibiotics Other (See Comments)    Unsure of allergy    Patient Measurements: Height: 5\' 6"  (167.6 cm) Weight: 140 lb (63.504 kg) IBW/kg (Calculated) : 59.3  Vital Signs: Temp: 99.8 F (37.7 C) (11/25 2028) Temp Source: Oral (11/25 2028) BP: 84/63 mmHg (11/26 0400) Pulse Rate: 74 (11/26 0400) Intake/Output from previous day: 11/25 0701 - 11/26 0700 In: 2000 [I.V.:2000] Out: -  Intake/Output from this shift: Total I/O In: 2000 [I.V.:2000] Out: -   Labs:  Recent Labs  07/26/15 2035 07/26/15 2050 07/26/15 2051  WBC [id number]*  --  DUPLICATE REQUEST SEE [id number]  HGB [id number]* DUPLICATE REQUEST SEE [id number]  PLT [id number]  --  DUPLICATE REQUEST SEE [id number]  CREATININE 0.72 0.60  --    Estimated Creatinine Clearance: 72.6 mL/min (by C-G formula based on Cr of 0.6). No results for input(s): VANCOTROUGH, VANCOPEAK, VANCORANDOM, GENTTROUGH, GENTPEAK, GENTRANDOM, TOBRATROUGH, TOBRAPEAK, TOBRARND, AMIKACINPEAK, AMIKACINTROU, AMIKACIN in the last 72 hours.   Microbiology: No results found for this or any previous visit (from the past 720 hour(s)).  Medical History: Past Medical History  Diagnosis Date  . Hyperlipidemia     controlled by diet  . History of anxiety     situational/stress induced, previously on paxil  . Urine incontinence     "difficulty holding urine"  . GERD (gastroesophageal reflux disease)     doing well off meds  . Cervical spine fracture ([place]) 2008    hx of   . Above knee amputation of left lower extremity ([place]) 2008    disability since then  . DDD (degenerative disc disease), lumbar 2007    s/p surgery  . Smoker     Medications:  Prescriptions prior to admission  Medication Sig Dispense Refill Last Dose  . pantoprazole  (PROTONIX) 40 MG tablet Take 1 tablet (40 mg total) by mouth daily. 60 tablet 3 04/16/2015   Assessment: 59 y.o. female with sepsis/PNA for empiric antibiotics  Goal of Therapy:  Vancomycin trough level 15-20 mcg/ml  Plan:  Vancomycin 1250 mg IV now, then 1 g IV q12h  Zosyn 3.375 g IV q8h   Caryl Pina 07/27/2015,5:14 AM
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
## 2015-07-27 NOTE — Progress Notes (Signed)
[facility] TEAM 1 - Stepdown/ICU TEAM Progress Note  Rachel Mcintosh [id number] DOB: 03-Nov-1955 DOA: 07/26/2015 PCP: Ria Bush, MD  Admit HPI / Brief Narrative: 59 y.o. female PMHx Anxiety, HLD, C-spine fracture, L-spine DDD S/P Lt AKA, tobacco abuse  Presenting to the ED with complaints of sharp left sided chest pain worse with deep breaths that started around 4 pm. She reports havingn a non-productive cough. She denies any fevers or chills. She was evaluated in the ED and found to have an elevated D-Dimer of 2.41, and was sent for a CTA of the Chest which revealed LLL opacity. She developed hypoxia to 88% and hypotension and was administered IVFs and NCO2. A Sepsis workup was initiated and she was referred for admission.    HPI/Subjective: Patient just arrived on floor on 11/26  Assessment/Plan: Sepsis (HCC)/ CAP -Continue current antibiotics  -DuoNeb QID -Flutter valve -Mucinex DM BID -Solu-Medrol 60 mg daily  Hypotension -Most likely secondary to sepsis -Hypomagnesemia; magnesium IV 3 gm -Normal saline 48ml/hr   Hypoxia -Titrate O2 to maintain SPO2> 93%   Code Status: FULL Family Communication: no family present at time of exam Disposition Plan: Resolution of sepsis    Consultants:   Procedure/Significant Events:    Culture   Antibiotics: Zosyn 11/26>> 11/26 Vancomycin 11/26>> 11/26 Azithromycin 11/26>> Ceftriaxone 11/26>>   DVT prophylaxis: Lovenox   Devices    LINES / TUBES:      Continuous Infusions: . sodium chloride 75 mL/hr (07/27/15 0615)    Objective: VITAL SIGNS: Temp: 97.9 F (36.6 C) (11/26 0505) Temp Source: Oral (11/26 0505) BP: 71/46 mmHg (11/26 0800) Pulse Rate: 84 (11/26 0800) SPO2; FIO2:   Intake/Output Summary (Last 24 hours) at 07/27/15 0821 Last data filed at 07/27/15 0615  Gross per 24 hour  Intake   2740 ml  Output   1200 ml  Net   1540 ml     Exam: Patient  was admitted early morning 11/26, at~0400. Patient is stable with nothing new on exam to report from admitting teams exam.      Data Reviewed: Basic Metabolic Panel:  Recent Labs Lab 07/26/15 2035 07/26/15 2050 07/27/15 0643  NA 132* 138 137  K 4.0 4.1 3.5  CL 97* 99* 113*  CO2 25  --  19*  GLUCOSE 128* 124* 144*  BUN 11 13 7   CREATININE 0.72 0.60 0.60  CALCIUM 9.3  --  7.8*   Liver Function Tests: No results for input(s): AST, ALT, ALKPHOS, BILITOT, PROT, ALBUMIN in the last 168 hours. No results for input(s): LIPASE, AMYLASE in the last 168 hours. No results for input(s): AMMONIA in the last 168 hours. CBC:  Recent Labs Lab 07/26/15 2035 07/26/15 2050 07/26/15 2051 07/27/15 0643  WBC [id number]*  --  DUPLICATE REQUEST SEE Q000111Q 14.5*  NEUTROABS  --   --  14.1*  --   HGB 99991111 [id number]* DUPLICATE REQUEST SEE Q000111Q 12.2  HCT 0000000 [id number] DUPLICATE REQUEST SEE Q000111Q 36.0  MCV 99991111  --  DUPLICATE REQUEST SEE Q000111Q 86.7  PLT Q000111Q  --  DUPLICATE REQUEST SEE Q000111Q 234   Cardiac Enzymes: No results for input(s): CKTOTAL, CKMB, CKMBINDEX, TROPONINI in the last 168 hours. BNP (last 3 results) No results for input(s): BNP in the last 8760 hours.  ProBNP (last 3 results) No results for input(s): PROBNP in the last 8760 hours.  CBG: No results for input(s): GLUCAP in the last 168 hours.  No results found for this or any previous  visit (from the past 240 hour(s)).   Studies:  Recent x-ray studies have been reviewed in detail by the Attending Physician  Scheduled Meds:  Scheduled Meds: . azithromycin  500 mg Intravenous Q24H  . enoxaparin (LOVENOX) injection  40 mg Subcutaneous Daily  . piperacillin-tazobactam (ZOSYN)  IV  3.375 g Intravenous 3 times per day  . vancomycin  1,000 mg Intravenous Q12H    Time spent on care of this patient: 40 mins   Terel Bann, Geraldo Docker , MD  [facility] Office  [phone] Pager [phone]  On-Call/Text Page:       [email or web address]      password [email or web address]  If 7PM-7AM, please contact night-coverage [email or web address] Password [email or web address] 07/27/2015, 8:21 AM   LOS: 0 days   Care during the described time interval was provided by me .  I have reviewed this patient's available data, including medical history, events of note, physical examination, and all test results as part of my evaluation. I have personally reviewed and interpreted all radiology studies.   Dia Crawford, MD [phone] Pager

## 2015-07-27 NOTE — ED Notes (Signed)
MD gave verbal order to give pt a 1 liter bolus

## 2015-07-27 NOTE — ED Provider Notes (Addendum)
1:34 AM Daughter: Nira Conn [phone])  Temp 100.2 Rectal, HR 86, BP 78/48, 72/44, 80/52, pulse ox 88%.   Suspect LLL PNA given abnormal appearance of CT at left base which is where her pain is. Cover for CAP at this time. 2 liter fluid bolus at this time for concern for sepsis. Lactate. Blood culture. 2 IVs.    07/27/2015  ADDENDUM REPORT: 07/27/2015 01:37 ADDENDUM: Focal opacity anteriorly in the left base above the hemidiaphragm likely atelectasis but focal pneumonia also possible in the appropriate clinical setting. Electronically Signed   By: Lucienne Capers M.D.   On: 07/27/2015 01:37   [id number] AM BP [id number] systolic after 2 liters IVFs  Jola Schmidt, MD 07/27/15 [place], MD 07/27/15 [id number]

## 2015-07-27 NOTE — Discharge Instructions (Signed)

## 2015-07-28 ENCOUNTER — Inpatient Hospital Stay (HOSPITAL_COMMUNITY): Payer: Medicare Other

## 2015-07-28 DIAGNOSIS — A419 Sepsis, unspecified organism: Secondary | ICD-10-CM | POA: Diagnosis present

## 2015-07-28 DIAGNOSIS — E876 Hypokalemia: Secondary | ICD-10-CM | POA: Diagnosis present

## 2015-07-28 DIAGNOSIS — J189 Pneumonia, unspecified organism: Secondary | ICD-10-CM

## 2015-07-28 DIAGNOSIS — I959 Hypotension, unspecified: Secondary | ICD-10-CM

## 2015-07-28 DIAGNOSIS — R079 Chest pain, unspecified: Secondary | ICD-10-CM

## 2015-07-28 LAB — INFLUENZA PANEL BY PCR (TYPE A & B)
H1N1 flu by pcr: NOT DETECTED
Influenza A By PCR: NEGATIVE
Influenza B By PCR: NEGATIVE

## 2015-07-28 LAB — STREP PNEUMONIAE URINARY ANTIGEN: Strep Pneumo Urinary Antigen: NEGATIVE

## 2015-07-28 MED ORDER — DEXTROSE 5 % IV SOLN
1.0000 g | INTRAVENOUS | Status: DC
  Administered 2015-07-28 – 2015-07-30 (×3): 1 g via INTRAVENOUS
  Filled 2015-07-28 (×3): qty 10

## 2015-07-28 MED ORDER — IPRATROPIUM-ALBUTEROL 0.5-2.5 (3) MG/3ML IN SOLN
3.0000 mL | Freq: Three times a day (TID) | RESPIRATORY_TRACT | Status: DC
  Administered 2015-07-29 – 2015-07-31 (×8): 3 mL via RESPIRATORY_TRACT
  Filled 2015-07-28 (×9): qty 3

## 2015-07-28 MED ORDER — POTASSIUM CHLORIDE 10 MEQ/100ML IV SOLN
10.0000 meq | INTRAVENOUS | Status: AC
  Administered 2015-07-28 (×4): 10 meq via INTRAVENOUS
  Filled 2015-07-28 (×3): qty 100

## 2015-07-28 NOTE — Progress Notes (Signed)
Utilization review completed.  

## 2015-07-28 NOTE — Progress Notes (Addendum)
[facility] TEAM 1 - Stepdown/ICU TEAM Progress Note  Rachel Mcintosh [id number] DOB: 1956-07-24 DOA: 07/26/2015 PCP: Ria Bush, MD  Admit HPI / Brief Narrative: 59 y.o. WF PMHx Anxiety, HLD, C-spine fracture, L-spine DDD S/P Lt AKA, tobacco abuse  Presenting to the ED with complaints of sharp left sided chest pain worse with deep breaths that started around 4 pm. She reports havingn a non-productive cough. She denies any fevers or chills. She was evaluated in the ED and found to have an elevated D-Dimer of 2.41, and was sent for a CTA of the Chest which revealed LLL opacity. She developed hypoxia to 88% and hypotension and was administered IVFs and NCO2. A Sepsis workup was initiated and she was referred for admission.    HPI/Subjective: 11/27  A/O 4, negative N/V, negative CP, negative SOB, positive DOE. States does continue to smoke~ 1/2 PPD    Assessment/Plan: Sepsis (HCC)/ CAP -Continue current antibiotics  -DuoNeb QID -Flutter valve -Mucinex DM BID -Solu-Medrol 60 mg daily  Hypotension -Most likely secondary to sepsis -Continue Normal saline 69ml/hr  Pulmonary Hypertension    Hypoxia -Titrate O2 to maintain SPO2> 93%  Hypokalemia -Potassium IV 40 mEq   Code Status: FULL Family Communication: no family present at time of exam Disposition Plan: Resolution of sepsis    Consultants: NA  Procedure/Significant Events: 11/27 echocardiogram - LVEF= 65% to 70%. - Pulmonary arteries: PA peak pressure: 42 mm Hg (S).   . Culture 11/26 blood right hand/left arm NGTD 11/26 MRSA by PCR negative 11/27 strep pneumo urine antigen pending 11/27 Legionella urine antigen pending 11/27 influenza pending   Antibiotics: Zosyn 11/26>> 11/26 Vancomycin 11/26>> 11/26 Azithromycin 11/26>> Ceftriaxone 11/27>>   DVT prophylaxis: Lovenox   Devices    LINES / TUBES:      Continuous Infusions: . sodium chloride 75 mL/hr  (07/27/15 0615)    Objective: VITAL SIGNS: Temp: 98.3 F (36.8 C) (11/27 0700) Temp Source: Oral (11/27 0700) BP: 125/62 mmHg (11/27 0900) Pulse Rate: 86 (11/27 0900) SPO2; FIO2:   Intake/Output Summary (Last 24 hours) at 07/28/15 1147 Last data filed at 07/28/15 0915  Gross per 24 hour  Intake   2807 ml  Output   2525 ml  Net    282 ml     Exam: General: A/O 4, positive acute respiratory distress when mobile. Eyes: Negative headache, ,negative scleral hemorrhage ENT: Negative Runny nose, negative ear pain, negative gingival bleeding, Neck:  Negative scars, masses, torticollis, lymphadenopathy, JVD Lungs: Clear to auscultation bilaterally without wheezes or crackles Cardiovascular: Regular rate and rhythm without murmur gallop or rub normal S1 and S2 Abdomen:negative abdominal pain, nondistended, positive soft, bowel sounds, no rebound, no ascites, no appreciable mass Extremities: No significant cyanosis, clubbing, or edema bilateral lower extremities Psychiatric:  Negative depression, negative anxiety, negative fatigue, negative mania Neurologic:  Cranial nerves II through XII intact, tongue/uvula midline, all extremities muscle strength 5/5, sensation intact throughout,negative dysarthria, negative expressive aphasia, negative receptive aphasia.       Data Reviewed: Basic Metabolic Panel:  Recent Labs Lab 07/26/15 2035 07/26/15 2050 07/27/15 0643 07/27/15 0948 07/27/15 1700  NA 132* 138 137 136 138  K 4.0 4.1 3.5 3.5 3.3*  CL 97* 99* 113* 110 112*  CO2 25  --  19* 18* 20*  GLUCOSE 128* 124* 144* 183* 214*  BUN 11 13 7 7 6   CREATININE 0.72 0.60 0.60 0.63 0.57  CALCIUM 9.3  --  7.8* 7.8* 8.2*  MG  --   --   --  1.6* 2.7*   Liver Function Tests:  Recent Labs Lab 07/27/15 0948 07/27/15 1700  AST 17 15  ALT 12* 13*  ALKPHOS 43 40  BILITOT 0.5 0.5  PROT 5.3* 5.3*  ALBUMIN 3.0* 2.8*   No results for input(s): LIPASE, AMYLASE in the last 168  hours. No results for input(s): AMMONIA in the last 168 hours. CBC:  Recent Labs Lab 07/26/15 2035 07/26/15 2050 07/26/15 2051 07/27/15 0643  WBC [id number]*  --  DUPLICATE REQUEST SEE Q000111Q 14.5*  NEUTROABS  --   --  14.1*  --   HGB 99991111 [id number]* DUPLICATE REQUEST SEE Q000111Q 12.2  HCT 0000000 [id number] DUPLICATE REQUEST SEE Q000111Q 36.0  MCV 99991111  --  DUPLICATE REQUEST SEE Q000111Q 86.7  PLT Q000111Q  --  DUPLICATE REQUEST SEE Q000111Q 234   Cardiac Enzymes: No results for input(s): CKTOTAL, CKMB, CKMBINDEX, TROPONINI in the last 168 hours. BNP (last 3 results) No results for input(s): BNP in the last 8760 hours.  ProBNP (last 3 results) No results for input(s): PROBNP in the last 8760 hours.  CBG: No results for input(s): GLUCAP in the last 168 hours.  Recent Results (from the past 240 hour(s))  Blood culture (routine x 2)     Status: None (Preliminary result)   Collection Time: 07/27/15  2:00 AM  Result Value Ref Range Status   Specimen Description BLOOD RIGHT HAND  Final   Special Requests BOTTLES DRAWN AEROBIC AND ANAEROBIC 5CC   Final   Culture NO GROWTH 1 DAY  Final   Report Status PENDING  Incomplete  Blood culture (routine x 2)     Status: None (Preliminary result)   Collection Time: 07/27/15  2:11 AM  Result Value Ref Range Status   Specimen Description BLOOD LEFT ARM  Final   Special Requests BOTTLES DRAWN AEROBIC AND ANAEROBIC 5CC   Final   Culture NO GROWTH 1 DAY  Final   Report Status PENDING  Incomplete  MRSA PCR Screening     Status: None   Collection Time: 07/27/15  6:45 AM  Result Value Ref Range Status   MRSA by PCR NEGATIVE NEGATIVE Final    Comment:        The GeneXpert MRSA Assay (FDA approved for NASAL specimens only), is one component of a comprehensive MRSA colonization surveillance program. It is not intended to diagnose MRSA infection nor to guide or monitor treatment for MRSA infections.      Studies:  Recent x-ray studies have been reviewed in detail  by the Attending Physician  Scheduled Meds:  Scheduled Meds: . azithromycin  500 mg Intravenous Q24H  . dextromethorphan-guaiFENesin  1 tablet Oral BID  . enoxaparin (LOVENOX) injection  40 mg Subcutaneous Daily  . ipratropium-albuterol  3 mL Nebulization Q6H  . methylPREDNISolone (SOLU-MEDROL) injection  60 mg Intravenous Q24H  . sodium chloride  3 mL Intravenous Q12H    Time spent on care of this patient: 40 mins   Kilo Eshelman, Geraldo Docker , MD  [facility] Office  [phone] Pager - [phone]  On-Call/Text Page:      [email or web address]      password [email or web address]  If 7PM-7AM, please contact night-coverage [email or web address] Password [email or web address] 07/28/2015, 11:47 AM   LOS: 1 day   Care during the described time interval was provided by me .  I have reviewed this patient's available data, including medical history, events of note, physical examination, and all test results as part of my evaluation. I have personally reviewed and interpreted  all radiology studies.   Dia Crawford, MD [phone] Pager

## 2015-07-28 NOTE — Progress Notes (Signed)
Echocardiogram 2D Echocardiogram has been performed.  Rachel Mcintosh 07/28/2015, 11:32 AM

## 2015-07-29 LAB — CBC WITH DIFFERENTIAL/PLATELET
BASOS ABS: 0 10*3/uL (ref 0.0–0.1)
BASOS PCT: 0 %
EOS ABS: 0 10*3/uL (ref 0.0–0.7)
Eosinophils Relative: 0 %
HEMATOCRIT: 32.2 % — AB (ref 36.0–46.0)
HEMOGLOBIN: 10.6 g/dL — AB (ref 12.0–15.0)
Lymphocytes Relative: 10 %
Lymphs Abs: 1.3 10*3/uL (ref 0.7–4.0)
MCH: 29.1 pg (ref 26.0–34.0)
MCHC: 32.9 g/dL (ref 30.0–36.0)
MCV: 88.5 fL (ref 78.0–100.0)
Monocytes Absolute: 0.8 10*3/uL (ref 0.1–1.0)
Monocytes Relative: 6 %
NEUTROS ABS: 10.4 10*3/uL — AB (ref 1.7–7.7)
NEUTROS PCT: 83 %
Platelets: 249 10*3/uL (ref 150–400)
RBC: 3.64 MIL/uL — ABNORMAL LOW (ref 3.87–5.11)
RDW: 13.7 % (ref 11.5–15.5)
WBC: 12.5 10*3/uL — ABNORMAL HIGH (ref 4.0–10.5)

## 2015-07-29 LAB — COMPREHENSIVE METABOLIC PANEL
ALK PHOS: 42 U/L (ref 38–126)
ALT: 17 U/L (ref 14–54)
ANION GAP: 5 (ref 5–15)
AST: 15 U/L (ref 15–41)
Albumin: 2.7 g/dL — ABNORMAL LOW (ref 3.5–5.0)
BILIRUBIN TOTAL: 0.2 mg/dL — AB (ref 0.3–1.2)
BUN: <5 mg/dL — ABNORMAL LOW (ref 6–20)
CALCIUM: 8.4 mg/dL — AB (ref 8.9–10.3)
CO2: 25 mmol/L (ref 22–32)
Chloride: 113 mmol/L — ABNORMAL HIGH (ref 101–111)
Creatinine, Ser: 0.53 mg/dL (ref 0.44–1.00)
Glucose, Bld: 115 mg/dL — ABNORMAL HIGH (ref 65–99)
Potassium: 4.7 mmol/L (ref 3.5–5.1)
Sodium: 143 mmol/L (ref 135–145)
TOTAL PROTEIN: 5.2 g/dL — AB (ref 6.5–8.1)

## 2015-07-29 LAB — MAGNESIUM: MAGNESIUM: 1.9 mg/dL (ref 1.7–2.4)

## 2015-07-29 MED ORDER — METHYLPREDNISOLONE SODIUM SUCC 125 MG IJ SOLR
60.0000 mg | INTRAMUSCULAR | Status: DC
  Administered 2015-07-29 – 2015-07-30 (×2): 60 mg via INTRAVENOUS
  Filled 2015-07-29 (×2): qty 2

## 2015-07-29 NOTE — Progress Notes (Signed)
[place] TEAM [place] TEAM PROGRESS NOTE  MAKAELYN EIB [id number] DOB: 07/09/1956 DOA: 07/26/2015 PCP: Ria Bush, MD  Admit HPI / Brief Narrative: 59 y.o. F Hx Anxiety, HLD, C-spine fracture, L-spine DDD, S/P Lt AKA, and tobacco abuse who presented to the ED with complaints of sharp left sided chest pain worse with deep breaths that started around 4 pm. She reported a non-productive cough. She denied fevers or chills. She was evaluated in the ED and found to have an elevated D-Dimer of 2.41.  CTA of the Chest revealed no evidence of PE or focal infiltrate. She developed hypoxia to 88% and hypotension and was administered IVFs and NCO2.   HPI/Subjective: The patient continues to report pleuritic-type pain.  She feels her shortness of breath is slightly improved but not significantly so.  She does not use oxygen at home but continues to require 3-4 L/m nasal cannula.  She denies nausea vomiting or abdominal pain.  Assessment/Plan:  Sepsis due to ?CAP v/s acute bronchitis  -repeat CXR in AM to see if infiltrate has now developed - CT and admit CXR not truly convincing for focal infiltrate, but pt has clear sx to suggest a pulmonary infection   Hypoxic respiratory failure due to above -Titrate O2 to maintain SPO2> 93% - continues to require 3+ LPM   Hypotension -due to sepsis - resolved   Pulmonary Hypertension -70mm Hg per TTE  Hypokalemia -corrected w/ supplementation   Normocytic anemia -check anemia panel   Code Status: FULL Family Communication: Spoke with patient and daughter at bedside at length Disposition Plan: SDU  Consultants: none  Procedures: 11/27 TTE - LVEF= 65% to 70%. - Pulmonary arteries: PA peak pressure: 42 mm Hg (S).  Antibiotics: Zosyn 11/26  Vancomycin 11/26 Azithromycin 11/25 > Ceftriaxone 11/25 >  DVT prophylaxis: lovenox  Objective: Blood pressure 120/74, pulse 83, temperature 98.3 F (36.8 C), temperature  source Oral, resp. rate 20, height 5\' 6"  (1.676 m), weight 63.504 kg (140 lb), SpO2 93 %.  Intake/Output Summary (Last 24 hours) at 07/29/15 1100 Last data filed at 07/29/15 0918  Gross per 24 hour  Intake   2532 ml  Output   1000 ml  Net   1532 ml   Exam: General: No acute respiratory distress but requiring nasal cannula O2 Lungs: Clear to auscultation bilaterally without wheezes or focal crackles Cardiovascular: Regular rate and rhythm without murmur gallop or rub normal S1 and S2 Abdomen: Nontender, nondistended, soft, bowel sounds positive, no rebound, no ascites, no appreciable mass Extremities: No significant cyanosis, clubbing, or edema bilateral lower extremities - s/p L AKA  Data Reviewed: Basic Metabolic Panel:  Recent Labs Lab 07/26/15 2035 07/26/15 2050 07/27/15 0643 07/27/15 0948 07/27/15 1700 07/29/15 0227  NA 132* 138 137 136 138 143  K 4.0 4.1 3.5 3.5 3.3* 4.7  CL 97* 99* 113* 110 112* 113*  CO2 25  --  19* 18* 20* 25  GLUCOSE 128* 124* 144* 183* 214* 115*  BUN 11 13 7 7 6  <5*  CREATININE 0.72 0.60 0.60 0.63 0.57 0.53  CALCIUM 9.3  --  7.8* 7.8* 8.2* 8.4*  MG  --   --   --  1.6* 2.7* 1.9    CBC:  Recent Labs Lab 07/26/15 2035 07/26/15 2050 07/26/15 2051 07/27/15 0643 07/29/15 0227  WBC 123XX123*  --  DUPLICATE REQUEST SEE Q000111Q 14.5* 12.5*  NEUTROABS  --   --  14.1*  --  10.4*  HGB 14.2 15.6*  DUPLICATE REQUEST SEE Q000111Q 12.2 10.6*  HCT 0000000 [id number] DUPLICATE REQUEST SEE Q000111Q 36.0 32.2*  MCV 99991111  --  DUPLICATE REQUEST SEE Q000111Q 86.7 88.5  PLT Q000111Q  --  DUPLICATE REQUEST SEE Q000111Q 234 249    Liver Function Tests:  Recent Labs Lab 07/27/15 0948 07/27/15 1700 07/29/15 0227  AST 17 15 15   ALT 12* 13* 17  ALKPHOS 43 40 42  BILITOT 0.5 0.5 0.2*  PROT 5.3* 5.3* 5.2*  ALBUMIN 3.0* 2.8* 2.7*   Coags:  Recent Labs Lab 07/27/15 0643  INR 1.20    Recent Labs Lab 07/27/15 0643  APTT 32    Recent Results (from the past 240 hour(s))    Blood culture (routine x 2)     Status: None (Preliminary result)   Collection Time: 07/27/15  2:00 AM  Result Value Ref Range Status   Specimen Description BLOOD RIGHT HAND  Final   Special Requests BOTTLES DRAWN AEROBIC AND ANAEROBIC 5CC   Final   Culture NO GROWTH 1 DAY  Final   Report Status PENDING  Incomplete  Blood culture (routine x 2)     Status: None (Preliminary result)   Collection Time: 07/27/15  2:11 AM  Result Value Ref Range Status   Specimen Description BLOOD LEFT ARM  Final   Special Requests BOTTLES DRAWN AEROBIC AND ANAEROBIC 5CC   Final   Culture NO GROWTH 1 DAY  Final   Report Status PENDING  Incomplete  MRSA PCR Screening     Status: None   Collection Time: 07/27/15  6:45 AM  Result Value Ref Range Status   MRSA by PCR NEGATIVE NEGATIVE Final    Comment:        The GeneXpert MRSA Assay (FDA approved for NASAL specimens only), is one component of a comprehensive MRSA colonization surveillance program. It is not intended to diagnose MRSA infection nor to guide or monitor treatment for MRSA infections.      Studies:   Recent x-ray studies have been reviewed in detail by the Attending Physician  Scheduled Meds:  Scheduled Meds: . azithromycin  500 mg Intravenous Q24H  . cefTRIAXone (ROCEPHIN)  IV  1 g Intravenous Q24H  . dextromethorphan-guaiFENesin  1 tablet Oral BID  . enoxaparin (LOVENOX) injection  40 mg Subcutaneous Daily  . ipratropium-albuterol  3 mL Nebulization TID  . methylPREDNISolone (SOLU-MEDROL) injection  60 mg Intravenous Q24H  . sodium chloride  3 mL Intravenous Q12H    Time spent on care of this patient: 35 mins   Ellysa Parrack T , MD   [facility] Office  [phone] Pager - Text Page per Shea Evans as per below:  On-Call/Text Page:      [email or web address]      password [email or web address]  If 7PM-7AM, please contact night-coverage [email or web address] Password [email or web address] 07/29/2015, 11:00 AM   LOS: 2 days

## 2015-07-30 ENCOUNTER — Inpatient Hospital Stay (HOSPITAL_COMMUNITY): Payer: Medicare Other

## 2015-07-30 DIAGNOSIS — I272 Other secondary pulmonary hypertension: Secondary | ICD-10-CM

## 2015-07-30 LAB — CBC
HEMATOCRIT: 32.5 % — AB (ref 36.0–46.0)
HEMOGLOBIN: 10.6 g/dL — AB (ref 12.0–15.0)
MCH: 28.8 pg (ref 26.0–34.0)
MCHC: 32.6 g/dL (ref 30.0–36.0)
MCV: 88.3 fL (ref 78.0–100.0)
Platelets: 272 10*3/uL (ref 150–400)
RBC: 3.68 MIL/uL — ABNORMAL LOW (ref 3.87–5.11)
RDW: 13.6 % (ref 11.5–15.5)
WBC: 8.7 10*3/uL (ref 4.0–10.5)

## 2015-07-30 LAB — BASIC METABOLIC PANEL
ANION GAP: 7 (ref 5–15)
BUN: 5 mg/dL — AB (ref 6–20)
CHLORIDE: 108 mmol/L (ref 101–111)
CO2: 26 mmol/L (ref 22–32)
Calcium: 8.5 mg/dL — ABNORMAL LOW (ref 8.9–10.3)
Creatinine, Ser: 0.55 mg/dL (ref 0.44–1.00)
GFR calc Af Amer: >60 mL/min (ref >60)
GLUCOSE: 139 mg/dL — AB (ref 65–99)
POTASSIUM: 4 mmol/L (ref 3.5–5.1)
Sodium: 141 mmol/L (ref 135–145)

## 2015-07-30 LAB — IRON AND TIBC
IRON: 24 ug/dL — AB (ref 28–170)
SATURATION RATIOS: 12 % (ref 10.4–31.8)
TIBC: 203 ug/dL — AB (ref 250–450)
UIBC: 179 ug/dL

## 2015-07-30 LAB — LEGIONELLA PNEUMOPHILA SEROGP 1 UR AG: L. pneumophila Serogp 1 Ur Ag: NEGATIVE

## 2015-07-30 LAB — FOLATE: FOLATE: 7.1 ng/mL (ref >5.9)

## 2015-07-30 LAB — VITAMIN B12: Vitamin B-12: 257 pg/mL (ref 180–914)

## 2015-07-30 LAB — RETICULOCYTES
RBC.: 3.68 MIL/uL — ABNORMAL LOW (ref 3.87–5.11)
RETIC COUNT ABSOLUTE: 47.8 10*3/uL (ref 19.0–186.0)
Retic Ct Pct: 1.3 % (ref 0.4–3.1)

## 2015-07-30 LAB — FERRITIN: FERRITIN: 87 ng/mL (ref 11–307)

## 2015-07-30 MED ORDER — ALBUTEROL SULFATE (2.5 MG/3ML) 0.083% IN NEBU
2.5000 mg | INHALATION_SOLUTION | Freq: Four times a day (QID) | RESPIRATORY_TRACT | Status: DC | PRN
  Administered 2015-07-30: 2.5 mg via RESPIRATORY_TRACT
  Filled 2015-07-30: qty 3

## 2015-07-30 NOTE — Progress Notes (Signed)
NURSING PROGRESS NOTE  Rachel Mcintosh [id number] Transfer Data: 07/30/2015 7:46 PM Attending Provider: Allie Bossier, MD [id number] Gutierrez, MD Code Status: Full   Rachel Mcintosh is a 59 y.o. female patient transferred from [facility]  -No acute distress noted.  -No complaints of shortness of breath.  -No complaints of chest pain.   Last Documented Vital Signs: Blood pressure 131/74, pulse 73, temperature 98 F (36.7 C), temperature source Oral, resp. rate 17, height 5\' 6"  (1.676 m), weight 63.504 kg (140 lb), SpO2 95 %.  IV Fluids:  IV in place, occlusive dsg intact without redness, IV cath antecubital right, condition patent and no redness normal saline.   Allergies:  Codeine; Doxycycline; and Sulfa antibiotics  Past Medical History:   has a past medical history of Hyperlipidemia; History of anxiety; Urine incontinence; GERD (gastroesophageal reflux disease); Cervical spine fracture ([place]) (2008); Above knee amputation of left lower extremity ([place]) (2008); DDD (degenerative disc disease), lumbar (2007); and Smoker.  Past Surgical History:   has past surgical history that includes Fracture surgery (2008); Lumbar disc surgery (2007); Leg amputation above knee (Left, 2008); Rotator cuff repair (Right, 2008); arm surgery (Left, 2008); Ankle surgery (2008); Osteochondroma excision (06/01/2012); Amputation (09/14/2012); Amputation (Left, 12/02/2012); Partial hysterectomy (2000); Colonoscopy (05/2012); and Esophagogastroduodenoscopy (04/2015).  Social History:   reports that she has been smoking Cigarettes.  She has a 17.5 pack-year smoking history. She has never used smokeless tobacco. She reports that she drinks alcohol. She reports that she does not use illicit drugs.  Skin: Intact   Patient/Family orientated to room. Information packet given to patient/family. Admission inpatient armband information verified with patient/family to include name and date of birth and placed on patient arm. Side rails  up x 2, fall assessment and education completed with patient/family. Patient/family able to verbalize understanding of risk associated with falls and verbalized understanding to call for assistance before getting out of bed. Call light within reach. Patient/family able to voice and demonstrate understanding of unit orientation instructions.

## 2015-07-30 NOTE — Progress Notes (Signed)
[place] TEAM 1 - Stepdown/ICU TEAM Progress Note  CORNELIOUS Mcintosh [id number] DOB: 1956/04/21 DOA: 07/26/2015 PCP: Ria Bush, MD  Admit HPI / Brief Narrative: 59 y.o. WF PMHx Anxiety, HLD, C-spine fracture, L-spine DDD S/P Lt AKA, tobacco abuse  Presenting to the ED with complaints of sharp left sided chest pain worse with deep breaths that started around 4 pm. She reports havingn a non-productive cough. She denies any fevers or chills. She was evaluated in the ED and found to have an elevated D-Dimer of 2.41, and was sent for a CTA of the Chest which revealed LLL opacity. She developed hypoxia to 88% and hypotension and was administered IVFs and NCO2. A Sepsis workup was initiated and she was referred for admission.    HPI/Subjective: 11/29  A/O 4, negative N/V, negative CP, positive occasional SOB usually when coughing spells occur. States does continue to smoke~ 1/2 PPD    Assessment/Plan: Sepsis (HCC)/ CAP -Continue current antibiotics  -DuoNeb TID -Flutter valve -Mucinex DM BID -Solu-Medrol 60 mg daily -Family needs to bring left leg prosthetic in in order to allow patient ambulate prior to discharge  Hypotension -Most likely secondary to sepsis; resolving with hydration -Continue Normal saline 57ml/hr  Pulmonary Hypertension -Would still hold medication as patient's BP is stable but on the lower end   Hypoxia -Titrate O2 to maintain SPO2> 93% -Patient on room air SPO2 100%  Hypokalemia -Resolved   Code Status: FULL Family Communication: no family present at time of exam Disposition Plan: Resolution of sepsis    Consultants: NA  Procedure/Significant Events: 11/27 echocardiogram - LVEF= 65% to 70%. - Pulmonary arteries: PA peak pressure: 42 mm Hg (S).   . Culture 11/26 blood right hand/left arm NGTD 11/26 MRSA by PCR negative 11/27 strep pneumo urine antigen negative 11/27 Legionella urine antigen  pending 11/27 influenza influenza A/B/H1N1 negative   Antibiotics: Zosyn 11/26>> 11/26 Vancomycin 11/26>> 11/26 Azithromycin 11/26>> Ceftriaxone 11/27>>   DVT prophylaxis: Lovenox   Devices    LINES / TUBES:      Continuous Infusions: . sodium chloride 50 mL/hr at 07/30/15 0600    Objective: VITAL SIGNS: Temp: 97.8 F (36.6 C) (11/29 0430) Temp Source: Oral (11/29 0430) BP: 125/72 mmHg (11/29 0430) Pulse Rate: 74 (11/29 0430) SPO2; FIO2:   Intake/Output Summary (Last 24 hours) at 07/30/15 0846 Last data filed at 07/30/15 0225  Gross per 24 hour  Intake    737 ml  Output   1100 ml  Net   -363 ml     Exam: General: A/O 4, positive acute respiratory distress when mobile. Eyes: Negative headache, ,negative scleral hemorrhage ENT: Negative Runny nose, negative ear pain, negative gingival bleeding, Neck:  Negative scars, masses, torticollis, lymphadenopathy, JVD Lungs: Clear to auscultation bilaterally without wheezes or crackles Cardiovascular: Regular rate and rhythm without murmur gallop or rub normal S1 and S2 Abdomen:negative abdominal pain, nondistended, positive soft, bowel sounds, no rebound, no ascites, no appreciable mass Extremities: No significant cyanosis, clubbing, or edema in right lower extremity; left AKA with compression sock in place Psychiatric:  Negative depression, negative anxiety, negative fatigue, negative mania Neurologic:  Cranial nerves II through XII intact, tongue/uvula midline, all extremities muscle strength 5/5, sensation intact throughout,negative dysarthria, negative expressive aphasia, negative receptive aphasia.       Data Reviewed: Basic Metabolic Panel:  Recent Labs Lab 07/27/15 0643 07/27/15 0948 07/27/15 1700 07/29/15 0227 07/30/15 0300  NA 137 136 138 143 141  K 3.5 3.5 3.3* 4.7 4.0  CL 113* 110 112* 113* 108  CO2 19* 18* 20* 25 26  GLUCOSE 144* 183* 214* 115* 139*  BUN 7 7 6  <5* 5*  CREATININE 0.60  0.63 0.57 0.53 0.55  CALCIUM 7.8* 7.8* 8.2* 8.4* 8.5*  MG  --  1.6* 2.7* 1.9  --    Liver Function Tests:  Recent Labs Lab 07/27/15 0948 07/27/15 1700 07/29/15 0227  AST 17 15 15   ALT 12* 13* 17  ALKPHOS 43 40 42  BILITOT 0.5 0.5 0.2*  PROT 5.3* 5.3* 5.2*  ALBUMIN 3.0* 2.8* 2.7*   No results for input(s): LIPASE, AMYLASE in the last 168 hours. No results for input(s): AMMONIA in the last 168 hours. CBC:  Recent Labs Lab 07/26/15 2035 07/26/15 2050 07/26/15 2051 07/27/15 0643 07/29/15 0227 07/30/15 0300  WBC 123XX123*  --  DUPLICATE REQUEST SEE Q000111Q 14.5* 12.5* 8.7  NEUTROABS  --   --  14.1*  --  10.4*  --   HGB 99991111 [id number]* DUPLICATE REQUEST SEE Q000111Q 12.2 10.6* 10.6*  HCT 0000000 [id number] DUPLICATE REQUEST SEE Q000111Q 36.0 32.2* 32.5*  MCV 99991111  --  DUPLICATE REQUEST SEE Q000111Q 86.7 88.5 88.3  PLT Q000111Q  --  DUPLICATE REQUEST SEE Q000111Q 234 249 272   Cardiac Enzymes: No results for input(s): CKTOTAL, CKMB, CKMBINDEX, TROPONINI in the last 168 hours. BNP (last 3 results) No results for input(s): BNP in the last 8760 hours.  ProBNP (last 3 results) No results for input(s): PROBNP in the last 8760 hours.  CBG: No results for input(s): GLUCAP in the last 168 hours.  Recent Results (from the past 240 hour(s))  Blood culture (routine x 2)     Status: None (Preliminary result)   Collection Time: 07/27/15  2:00 AM  Result Value Ref Range Status   Specimen Description BLOOD RIGHT HAND  Final   Special Requests BOTTLES DRAWN AEROBIC AND ANAEROBIC 5CC   Final   Culture NO GROWTH 2 DAYS  Final   Report Status PENDING  Incomplete  Blood culture (routine x 2)     Status: None (Preliminary result)   Collection Time: 07/27/15  2:11 AM  Result Value Ref Range Status   Specimen Description BLOOD LEFT ARM  Final   Special Requests BOTTLES DRAWN AEROBIC AND ANAEROBIC 5CC   Final   Culture NO GROWTH 2 DAYS  Final   Report Status PENDING  Incomplete  MRSA PCR Screening     Status: None    Collection Time: 07/27/15  6:45 AM  Result Value Ref Range Status   MRSA by PCR NEGATIVE NEGATIVE Final    Comment:        The GeneXpert MRSA Assay (FDA approved for NASAL specimens only), is one component of a comprehensive MRSA colonization surveillance program. It is not intended to diagnose MRSA infection nor to guide or monitor treatment for MRSA infections.      Studies:  Recent x-ray studies have been reviewed in detail by the Attending Physician  Scheduled Meds:  Scheduled Meds: . azithromycin  500 mg Intravenous Q24H  . cefTRIAXone (ROCEPHIN)  IV  1 g Intravenous Q24H  . dextromethorphan-guaiFENesin  1 tablet Oral BID  . enoxaparin (LOVENOX) injection  40 mg Subcutaneous Daily  . ipratropium-albuterol  3 mL Nebulization TID  . methylPREDNISolone (SOLU-MEDROL) injection  60 mg Intravenous Q24H  . sodium chloride  3 mL Intravenous Q12H    Time spent on care of this patient: 40 mins   WOODS, CURTIS J ,  MD  [facility] Office  [phone] Pager - [phone]  On-Call/Text Page:      [email or web address]      password [email or web address]  If 7PM-7AM, please contact night-coverage [email or web address] Password [email or web address] 07/30/2015, 8:46 AM   LOS: 3 days   Care during the described time interval was provided by me .  I have reviewed this patient's available data, including medical history, events of note, physical examination, and all test results as part of my evaluation. I have personally reviewed and interpreted all radiology studies.   Dia Crawford, MD [phone] Pager

## 2015-07-30 NOTE — Progress Notes (Signed)
Report given to Judson Roch, RN. Patient transferred via w/c to 5W with no signs of distress noted. Patient denies pain or dyspnea at this time.

## 2015-07-30 NOTE — Progress Notes (Signed)
Report received from [place], [place]

## 2015-07-30 NOTE — Evaluation (Signed)
Physical Therapy Evaluation Patient Details Name: Rachel Mcintosh MRN: [id number] DOB: [birth year] Today's Date: 07/30/2015   History of Present Illness  59 y.o. F Hx Anxiety, HLD, C-spine fracture, L-spine DDD, S/P Lt AKA, and tobacco abuse who presented to the ED with complaints of sharp left sided chest pain worse with deep breaths that started around 4 pm. She reported a non-productive cough. She denied fevers or chills. She was evaluated in the ED and found to have an elevated D-Dimer of 2.41. CTA of the Chest revealed no evidence of PE or focal infiltrate. She developed hypoxia to 88% and hypotension and was administered IVFs and NCO2. Sepsis due to CAP vs bronchitis.   Clinical Impression  Pt admitted with above diagnosis. Pt currently with functional limitations due to the deficits listed below (see PT Problem List). Pt with some slight right LE weakness due to bedbound for a few days.  Pt is able to transfer to chair with min guard assist.  Asked pt to call family to bring her prosthesis so that PT can assess how she does with it tomorrow.  Pt in agreement.  Will follow acutely.   Pt will benefit from skilled PT to increase their independence and safety with mobility to allow discharge to the venue listed below.    Follow Up Recommendations Home health PT;Supervision/Assistance - 24 hour    Equipment Recommendations  None recommended by PT    Recommendations for Other Services       Precautions / Restrictions Precautions Precautions: Fall Restrictions Weight Bearing Restrictions: No      Mobility  Bed Mobility Overal bed mobility: Independent                Transfers Overall transfer level: Needs assistance   Transfers: Sit to/from Stand;Stand Pivot Transfers Sit to Stand: Supervision Stand pivot transfers: Min guard          Ambulation/Gait                Stairs            Wheelchair Mobility    Modified Rankin (Stroke Patients  Only)       Balance                                             Pertinent Vitals/Pain Pain Assessment: No/denies pain  VSS    Home Living Family/patient expects to be discharged to:: Private residence Living Arrangements: Children Available Help at Discharge: Family;Available 24 hours/day (daughter, son in law and 2 grandkids just moved in ) Type of Home: House Home Access: Ramped entrance     Home Layout: One level Home Equipment: Wheelchair - manual;Bedside commode;Walker - 2 wheels;Cane - single point (sliding board)      Prior Function Level of Independence: Independent with assistive device(s)         Comments: Pt reports she used her wheelchair alot.  She would wear her prosthesis about 4 hours day.  She wore the prosthetic when she went out and would use the RW to walk.       Hand Dominance        Extremity/Trunk Assessment   Upper Extremity Assessment: Defer to OT evaluation           Lower Extremity Assessment: Generalized weakness;RLE deficits/detail;LLE deficits/detail RLE Deficits / Details: grossly 3+/5 LLE Deficits / Details: grossly 3+/5  Cervical / Trunk Assessment: Normal  Communication   Communication: No difficulties  Cognition Arousal/Alertness: Awake/alert Behavior During Therapy: WFL for tasks assessed/performed Overall Cognitive Status: Within Functional Limits for tasks assessed                      General Comments      Exercises General Exercises - Lower Extremity Heel Slides: AROM;Both;5 reps;Supine      Assessment/Plan    PT Assessment Patient needs continued PT services  PT Diagnosis Generalized weakness   PT Problem List Decreased balance;Decreased activity tolerance;Decreased mobility;Decreased knowledge of use of DME;Decreased safety awareness;Decreased knowledge of precautions  PT Treatment Interventions DME instruction;Gait training;Functional mobility training;Therapeutic  activities;Therapeutic exercise;Balance training;Patient/family education   PT Goals (Current goals can be found in the Care Plan section) Acute Rehab PT Goals Patient Stated Goal: to go home PT Goal Formulation: With patient Time For Goal Achievement: 08/13/15 Potential to Achieve Goals: Good    Frequency Min 3X/week   Barriers to discharge        Co-evaluation               End of Session Equipment Utilized During Treatment: Gait belt Activity Tolerance: Patient limited by fatigue Patient left: in bed;with call bell/phone within reach Nurse Communication: Mobility status         Time: [id number] PT Time Calculation (min) (ACUTE ONLY): 8 min   Charges:   PT Evaluation $Initial PT Evaluation Tier I: 1 Procedure     PT G CodesDenice Paradise 08-13-2015, 4:15 PM  [facility] Acute Rehabilitation [phone] (pager)

## 2015-07-31 DIAGNOSIS — E876 Hypokalemia: Secondary | ICD-10-CM

## 2015-07-31 DIAGNOSIS — D72829 Elevated white blood cell count, unspecified: Secondary | ICD-10-CM

## 2015-07-31 DIAGNOSIS — J441 Chronic obstructive pulmonary disease with (acute) exacerbation: Secondary | ICD-10-CM | POA: Insufficient documentation

## 2015-07-31 DIAGNOSIS — J471 Bronchiectasis with (acute) exacerbation: Secondary | ICD-10-CM | POA: Insufficient documentation

## 2015-07-31 DIAGNOSIS — K219 Gastro-esophageal reflux disease without esophagitis: Secondary | ICD-10-CM | POA: Insufficient documentation

## 2015-07-31 DIAGNOSIS — Z720 Tobacco use: Secondary | ICD-10-CM

## 2015-07-31 DIAGNOSIS — J189 Pneumonia, unspecified organism: Secondary | ICD-10-CM

## 2015-07-31 MED ORDER — IPRATROPIUM-ALBUTEROL 20-100 MCG/ACT IN AERS: 1.0000 | INHALATION_SPRAY | Freq: Four times a day (QID) | RESPIRATORY_TRACT | Status: DC | PRN

## 2015-07-31 MED ORDER — PREDNISONE 10 MG PO TABS
60.0000 mg | ORAL_TABLET | Freq: Every day | ORAL | Status: DC
  Administered 2015-07-31: 60 mg via ORAL
  Filled 2015-07-31: qty 1

## 2015-07-31 MED ORDER — HYDROCODONE-ACETAMINOPHEN 5-325 MG PO TABS: 1.0000 | ORAL_TABLET | Freq: Four times a day (QID) | ORAL | Status: DC | PRN

## 2015-07-31 MED ORDER — LEVOFLOXACIN 750 MG PO TABS
750.0000 mg | ORAL_TABLET | Freq: Every day | ORAL | Status: DC
  Administered 2015-07-31: 750 mg via ORAL
  Filled 2015-07-31: qty 1

## 2015-07-31 MED ORDER — LEVOFLOXACIN 750 MG PO TABS
750.0000 mg | ORAL_TABLET | Freq: Every day | ORAL | Status: DC
Start: 2015-07-31 — End: 2015-08-12

## 2015-07-31 MED ORDER — PREDNISONE 20 MG PO TABS: ORAL_TABLET | ORAL | Status: DC

## 2015-07-31 MED ORDER — DM-GUAIFENESIN ER 30-600 MG PO TB12: 1.0000 | ORAL_TABLET | Freq: Two times a day (BID) | ORAL | Status: DC

## 2015-07-31 NOTE — Care Management Note (Signed)
Case Management Note  Patient Details  Name: SARAYU BOYTE MRN: [id number] Date of Birth: [birth year]  Subjective/Objective:       Date: 07/31/15 Spoke with patient at the bedside.  Introduced self as Tourist information centre manager and explained role in discharge planning and how to be reached.  Verified patient lives in town,  with daughter and son n Sports coach. Has DME rolling walker, wheelchair, bsc and a slider board at home.  Expressed potential need for no other DME.  Verified patient anticipates to go home with family, at time of discharge and will have full-time supervision by family at this time to best of their knowledge. Patient denied needing help with their medication.  Patient is driven by daughter to MD appointments.  Verified patient has PCP Guttierrez. Patient chose [facility] for HHPT, referral made to [facility], [place] notified.  Soc will begin 24-48 hrs post dc.   Plan: CM will continue to follow for discharge planning and [facility] resources.              Action/Plan:   Expected Discharge Date:                  Expected Discharge Plan:  [place]  In-House Referral:     Discharge planning Services  CM Consult  Post Acute Care Choice:  Home Health Choice offered to:  Patient  DME Arranged:    DME Agency:     HH Arranged:  PT [place]:  [place]  Status of Service:  Completed, signed off  Medicare Important Message Given:    Date Medicare IM Given:    Medicare IM give by:    Date Additional Medicare IM Given:    Additional Medicare Important Message give by:     If discussed at [place] of Stay Meetings, dates discussed:    Additional Comments:  Zenon Mayo, RN 07/31/2015, 11:03 AM

## 2015-07-31 NOTE — Progress Notes (Signed)
Terrilyn Saver discharged Home with H/H PT per MD order.  Discharge instructions reviewed and discussed with the patient, all questions and concerns answered. Copy of instructions and scripts given to patient.    Medication List    TAKE these medications        dextromethorphan-guaiFENesin 30-600 MG 12hr tablet  Commonly known as:  MUCINEX DM  Take 1 tablet by mouth 2 (two) times daily.     HYDROcodone-acetaminophen 5-325 MG tablet  Commonly known as:  NORCO/VICODIN  Take 1 tablet by mouth every 6 (six) hours as needed for moderate pain (rare use).     Ipratropium-Albuterol 20-100 MCG/ACT Aers respimat  Commonly known as:  COMBIVENT  Inhale 1 puff into the lungs every 6 (six) hours as needed for wheezing or shortness of breath.     levofloxacin 750 MG tablet  Commonly known as:  LEVAQUIN  Take 1 tablet (750 mg total) by mouth daily.     pantoprazole 40 MG tablet  Commonly known as:  PROTONIX  Take 1 tablet (40 mg total) by mouth daily.     predniSONE 20 MG tablet  Commonly known as:  DELTASONE  Take 3 tablets by mouth daily X 2 day, then 2 tablets by mouth daily X 2 days; then 1 tablet by mouth daily X 2 days; then 1/2 tablet by mouth daily X 2 days and stop prednisone        Patients skin is clean, dry and intact, no evidence of skin break down. IV site discontinued and catheter remains intact. Site without signs and symptoms of complications. Dressing and pressure applied.  Patient escorted to car by NT in a wheelchair,  no distress noted upon discharge.  Wynetta Emery, Calven Gilkes C 07/31/2015 4:57 PM

## 2015-07-31 NOTE — Discharge Summary (Signed)
Physician Discharge Summary  Rachel Mcintosh [id number] DOB: 10/22/1955 DOA: 07/26/2015  PCP: Ria Bush, MD  Admit date: 07/26/2015 Discharge date: 07/31/2015  Time spent: 40 minutes  Recommendations for Outpatient Follow-up:  1. Repeat BMET to follow electrolyte sand renal function 2. Patient will need formal PFT and pulmonary evaluation; please arranged afor it as an outpatient after acute infection treatment is completed   Discharge Diagnoses:  Sepsis due to pneumonia ([place]) Bronchiectasis/CAP (community acquired pneumonia) Leukocytosis Hypotension Arterial hypotension Hypokalemia Presumed COPD GERD Tobacco abuse   Discharge Condition: stable and improved. Will discharge home with [facility] services (PT) for deconditioning. PCP follow up in 10 days   Diet recommendation: regular diet   Filed Weights   07/26/15 2028  Weight: 63.504 kg (140 lb)    History of present illness:  59 y.o. WF PMHx Anxiety, HLD, C-spine fracture, L-spine DDD S/P Lt AKA, tobacco abuse Presenting to the ED with complaints of sharp left sided chest pain worse with deep breaths that started around 4 pm. She reports havingn a non-productive cough. She denies any fevers or chills. She was evaluated in the ED and found to have an elevated D-Dimer of 2.41, and was sent for a CTA of the Chest which revealed LLL opacity. She developed hypoxia to 88% and hypotension and was administered IVFs and NCO2. A Sepsis workup was initiated and she was referred for admission.   Hospital Course:  Sepsis [facility]) due to CAP/bronchiectasis  -excellent response to IV antibiotics -transition to levaquin to complete therapy at discharge  -PRN combivent -mucinex and use of flutte rvalve -will use tapering steroids for presumed COPD component  -outpatient follow up for PFT's -non toxic and with complete resolution of septic features at discharge (no tachypnea, good O2 sat on RA, no tachycardia, WBC's WNL and no  fever)  Hypotension -Most likely secondary to sepsis; resolved with hydration -Vital signs stable at discharge   Presumed COPD -will discharge on tapering steroids, antibiotics and PRN combivent -needs outpatient PFT's -advice to quit smoking   Hypoxia -due to presumed COPD exacerbation and PNA  -Patient on room air with O2 sat of 100% at discharge -will complete therapy for COPD exacerbation and PNA  Hypokalemia -Resolved -most likely associated to use of albuterol  GERD -continue protonix   Tobacco abuse -smoking cessation provided -patient is looking to quit  Physical deconditioning  -will discharge home with [facility] services   Procedures:  See below for x-ray reports   11/27 2-D echocardiogram - LVEF= 65% to 70%. - Pulmonary arteries: PA peak pressure: 42 mm Hg (S).  Consultations:  None   Discharge Exam: Filed Vitals:   07/31/15 0449 07/31/15 1258  BP: 146/80 129/68  Pulse: 68 73  Temp: 98.4 F (36.9 C) 97.8 F (36.6 C)  Resp: 16 20    General: afebrile, AAOX3, in no distress; denies CP and SOB Cardiovascular: S1 and S2, no rubs or gallops Respiratory: no wheezing on exam; scattered rhonchi; improved air movement  Abd: soft, NT, ND, positive BS Extremities: left AKA; no swelling and no cyanosis appreciated   Discharge Instructions   Discharge Instructions    Diet - low sodium heart healthy    Complete by:  As directed      Discharge instructions    Complete by:  As directed   Take medications as prescribed Avoid smoking Arrange Follow with PCP in 10 days Keep yourself well hydrated          Current Discharge Medication List  START taking these medications   Details  dextromethorphan-guaiFENesin (MUCINEX DM) 30-600 MG 12hr tablet Take 1 tablet by mouth 2 (two) times daily. Qty: 40 tablet, Refills: 0    Ipratropium-Albuterol (COMBIVENT) 20-100 MCG/ACT AERS respimat Inhale 1 puff into the lungs every 6 (six) hours as needed for wheezing  or shortness of breath. Qty: 1 Inhaler, Refills: 1    levofloxacin (LEVAQUIN) 750 MG tablet Take 1 tablet (750 mg total) by mouth daily. Qty: 7 tablet, Refills: 0    predniSONE (DELTASONE) 20 MG tablet Take 3 tablets by mouth daily X 2 day, then 2 tablets by mouth daily X 2 days; then 1 tablet by mouth daily X 2 days; then 1/2 tablet by mouth daily X 2 days and stop prednisone Qty: 15 tablet, Refills: 0      CONTINUE these medications which have CHANGED   Details  HYDROcodone-acetaminophen (NORCO/VICODIN) 5-325 MG tablet Take 1 tablet by mouth every 6 (six) hours as needed for moderate pain (rare use). Qty: 20 tablet, Refills: 0      CONTINUE these medications which have NOT CHANGED   Details  pantoprazole (PROTONIX) 40 MG tablet Take 1 tablet (40 mg total) by mouth daily. Qty: 60 tablet, Refills: 3       Allergies  Allergen Reactions  . Codeine Nausea And Vomiting  . Doxycycline Other (See Comments)    Worsened GERD  . Sulfa Antibiotics Other (See Comments)    Unsure of allergy   Follow-up Information    Follow up with Ria Bush, MD. Schedule an appointment as soon as possible for a visit in 10 days.   Specialty:  Family Medicine   Contact information:   [place] [address] [phone]       Follow up with [facility].   Why:  HHPT   Contact information:   [address] [place] [address] [phone]       The results of significant diagnostics from this hospitalization (including imaging, microbiology, ancillary and laboratory) are listed below for reference.    Significant Diagnostic Studies: Ct Angio Chest Pe W/cm &/or Wo Cm  07/27/2015  ADDENDUM REPORT: 07/27/2015 01:37 ADDENDUM: Focal opacity anteriorly in the left base above the hemidiaphragm likely atelectasis but focal pneumonia also possible in the appropriate clinical setting. Electronically Signed   By: Lucienne Capers M.D.   On:  07/27/2015 01:37  07/27/2015  CLINICAL DATA:  Left shoulder pain radiating to the left breast in across the back to the right side. Tachycardia and tachypneic. EXAM: CT ANGIOGRAPHY CHEST WITH CONTRAST TECHNIQUE: Multidetector CT imaging of the chest was performed using the standard protocol during bolus administration of intravenous contrast. Multiplanar CT image reconstructions and MIPs were obtained to evaluate the vascular anatomy. CONTRAST:  123mL OMNIPAQUE IOHEXOL 350 MG/ML SOLN COMPARISON:  Chest radiograph 07/26/2015 FINDINGS: Technically adequate study with good opacification of the central and segmental pulmonary arteries. No focal filling defects are demonstrated. No evidence of significant pulmonary embolus. Normal heart size. Normal caliber thoracic aorta. Great vessel origins are patent. Mild vascular calcifications. Esophagus is decompressed. No significant lymphadenopathy in the chest. Diffuse emphysematous changes in the lungs. Small focal areas of atelectasis in the lung bases. No focal consolidation or airspace disease. Airways appear patent. Mild interstitial changes and airways thickening likely representing chronic bronchitis. No pleural effusions. No pneumothorax. Included portions of the upper abdominal organs demonstrate multiple lesions in the left kidney, likely representing cysts although too small  to characterize at this contrast phase. Postoperative changes from T 2 through T4 with anterior fixation across a old compression fracture. Review of the MIP images confirms the above findings. IMPRESSION: No evidence of significant pulmonary embolus. Emphysematous and chronic bronchitic changes in the lungs. No evidence of active pulmonary infiltration. Rib Electronically Signed: By: Lucienne Capers M.D. On: 07/26/2015 23:39   Dg Chest Port 1 View  07/30/2015  CLINICAL DATA:  Pneumonia. EXAM: PORTABLE CHEST 1 VIEW COMPARISON:  CT 07/26/2015.  Chest x-ray 07/26/2015. FINDINGS: Mediastinum  and hilar structures normal. Heart size stable. Low lung volumes with bibasilar atelectasis and/or infiltrates/edema. Small bilateral pleural effusions. No pneumothorax. Prior thoracic spine fusion. Postsurgical changes right shoulder. IMPRESSION: 1. Low lung volumes with bibasilar atelectasis and/or infiltrates/edema. A component of acute congestive heart failure cannot be excluded. The heart is not enlarged. 2. Small bilateral pleural effusions. Electronically Signed   By: Loco Hills   On: 07/30/2015 07:32   Dg Chest Port 1 View  07/26/2015  CLINICAL DATA:  Chest pain and shortness of breath today. EXAM: PORTABLE CHEST 1 VIEW COMPARISON:  06/21/2013 FINDINGS: Emphysematous changes in the lungs. Fibrosis or linear atelectasis in the lung bases. No focal airspace disease or consolidation. No blunting of costophrenic angles. No pneumothorax. Normal heart size and pulmonary vascularity. Postoperative changes in the thoracic spine and right shoulder. Old fracture deformity of the left clavicle. IMPRESSION: Emphysematous changes in the lungs with linear fibrosis or atelectasis in the lung bases. No evidence of active consolidation. Electronically Signed   By: Lucienne Capers M.D.   On: 07/26/2015 21:07    Microbiology: Recent Results (from the past 240 hour(s))  Blood culture (routine x 2)     Status: None (Preliminary result)   Collection Time: 07/27/15  2:00 AM  Result Value Ref Range Status   Specimen Description BLOOD RIGHT HAND  Final   Special Requests BOTTLES DRAWN AEROBIC AND ANAEROBIC 5CC   Final   Culture NO GROWTH 4 DAYS  Final   Report Status PENDING  Incomplete  Blood culture (routine x 2)     Status: None (Preliminary result)   Collection Time: 07/27/15  2:11 AM  Result Value Ref Range Status   Specimen Description BLOOD LEFT ARM  Final   Special Requests BOTTLES DRAWN AEROBIC AND ANAEROBIC 5CC   Final   Culture NO GROWTH 4 DAYS  Final   Report Status PENDING  Incomplete  MRSA  PCR Screening     Status: None   Collection Time: 07/27/15  6:45 AM  Result Value Ref Range Status   MRSA by PCR NEGATIVE NEGATIVE Final    Comment:        The GeneXpert MRSA Assay (FDA approved for NASAL specimens only), is one component of a comprehensive MRSA colonization surveillance program. It is not intended to diagnose MRSA infection nor to guide or monitor treatment for MRSA infections.      Labs: Basic Metabolic Panel:  Recent Labs Lab 07/27/15 0643 07/27/15 0948 07/27/15 1700 07/29/15 0227 07/30/15 0300  NA 137 136 138 143 141  K 3.5 3.5 3.3* 4.7 4.0  CL 113* 110 112* 113* 108  CO2 19* 18* 20* 25 26  GLUCOSE 144* 183* 214* 115* 139*  BUN 7 7 6  <5* 5*  CREATININE 0.60 0.63 0.57 0.53 0.55  CALCIUM 7.8* 7.8* 8.2* 8.4* 8.5*  MG  --  1.6* 2.7* 1.9  --    Liver Function Tests:  Recent Labs Lab 07/27/15 608 173 5683  07/27/15 1700 07/29/15 0227  AST 17 15 15   ALT 12* 13* 17  ALKPHOS 43 40 42  BILITOT 0.5 0.5 0.2*  PROT 5.3* 5.3* 5.2*  ALBUMIN 3.0* 2.8* 2.7*   CBC:  Recent Labs Lab 07/26/15 2035 07/26/15 2050 07/26/15 2051 07/27/15 0643 07/29/15 0227 07/30/15 0300  WBC 123XX123*  --  DUPLICATE REQUEST SEE [id number] 14.5* 12.5* 8.7  NEUTROABS  --   --  14.1*  --  10.4*  --   HGB [id number]* DUPLICATE REQUEST SEE [id number] 12.2 10.6* 10.6*  HCT 0000000 [id number] DUPLICATE REQUEST SEE [id number] 36.0 32.2* 32.5*  MCV [id number]  --  DUPLICATE REQUEST SEE Q000111Q 86.7 88.5 88.3  PLT Q000111Q  --  DUPLICATE REQUEST SEE [id number] 234 249 272   Signed:  Barton Dubois  [facility] 07/31/2015, 2:58 PM

## 2015-07-31 NOTE — Progress Notes (Signed)
PT Cancellation Note  Patient Details Name: Rachel Mcintosh MRN: [id number] DOB: 12/03/1955   Cancelled Treatment:    Reason Eval/Treat Not Completed: Other (comment). Pt had been sitting up for most of morning. Pt feels comfortable returning home primarily using W/C and does not feel she needs to use her prosthetic while in the hospital. She verbalized a very good understanding of resuming the use of her prosthetic intermittently at home as she has done in the past.   [facility] 07/31/2015, 11:05 AM [facility] PT [phone]

## 2015-07-31 NOTE — Progress Notes (Signed)
Pharmacist Provided - Patient Medication Education Prior to Discharge   Rachel Mcintosh is an 59 y.o. female who presented to [facility] on 07/26/2015 with a chief complaint of  Chief Complaint  Patient presents with  . Chest Pain     [x]  Patient will be discharged with 4 new medications []  Patient being discharged without any new medications  The following medications were discussed with the patient: mucinex, combivent, levaquin, prednisone  Pain Control medications: []  Yes    []  No  Diabetes Medications: []  Yes    []  No  Heart Failure Medications: []  Yes    []  No  Anticoagulation Medications:  []  Yes    []  No  Antibiotics at discharge: [x]  Yes    []  No  Allergy Assessment Completed and Updated: [x]  Yes    []  No Identified Patient Allergies:  Allergies  Allergen Reactions  . Codeine Nausea And Vomiting  . Doxycycline Other (See Comments)    Worsened GERD  . Sulfa Antibiotics Other (See Comments)    Unsure of allergy     Medication Adherence Assessment: [x]  Excellent (no doses missed/week)      []  Good (1 dose missed/week)      []  Partial (2-3 doses missed/week)      []  Poor (>3 doses missed/week)  Barriers to Obtaining Medications: []  Yes [x]  No  Assessment: Counseled pt on discharge medications.  Pt demonstrated understanding and demonstrated no adherence or cost barriers.   Time spent preparing for discharge counseling: 10 min Time spent counseling patient: 10 min  Kem Parkinson, PharmD 07/31/2015, 4:35 PM

## 2015-07-31 NOTE — Care Management Important Message (Signed)
Important Message  Patient Details  Name: Rachel Mcintosh MRN: [id number] Date of Birth: 1956-04-11   Medicare Important Message Given:  Yes    Nathen May 07/31/2015, 3:34 PM

## 2015-08-01 ENCOUNTER — Telehealth: Payer: Self-pay | Admitting: *Deleted

## 2015-08-01 LAB — CULTURE, BLOOD (ROUTINE X 2)
CULTURE: NO GROWTH
CULTURE: NO GROWTH

## 2015-08-01 NOTE — Telephone Encounter (Signed)
Transition Care Management Follow-up Telephone Call   Date discharged? 07/31/15   How have you been since you were released from the hospital? Not 100% but improving    Do you understand why you were in the hospital? yes   Do you understand the discharge instructions? yes   Where were you discharged to? Home   Items Reviewed:  Medications reviewed: yes  Allergies reviewed: yes  Dietary changes reviewed: no  Referrals reviewed: yes, home health/PT   Functional Questionnaire:   Activities of Daily Living (ADLs):   She states they are independent in the following: ambulation, bathing and hygiene, feeding, continence, grooming, toileting and dressing States they require assistance with the following: None   Any transportation issues/concerns?: no   Any patient concerns? no   Confirmed importance and date/time of follow-up visits scheduled yes, 08/12/15  Provider Appointment booked with Ria Bush, MD  Confirmed with patient if condition begins to worsen call PCP or go to the ER.  Patient was given the office number and encouraged to call back with question or concerns.  : yes

## 2015-08-01 NOTE — Telephone Encounter (Signed)
Transitional care call attempted.  Left message for patient to return call. 

## 2015-08-12 ENCOUNTER — Ambulatory Visit (INDEPENDENT_AMBULATORY_CARE_PROVIDER_SITE_OTHER): Payer: Medicare Other | Admitting: Family Medicine

## 2015-08-12 ENCOUNTER — Encounter: Payer: Self-pay | Admitting: Family Medicine

## 2015-08-12 VITALS — BP 108/70 | HR 81 | Temp 99.0°F | Wt 142.0 lb

## 2015-08-12 DIAGNOSIS — Z87891 Personal history of nicotine dependence: Secondary | ICD-10-CM

## 2015-08-12 DIAGNOSIS — E8809 Other disorders of plasma-protein metabolism, not elsewhere classified: Secondary | ICD-10-CM

## 2015-08-12 DIAGNOSIS — R079 Chest pain, unspecified: Secondary | ICD-10-CM | POA: Insufficient documentation

## 2015-08-12 DIAGNOSIS — J449 Chronic obstructive pulmonary disease, unspecified: Secondary | ICD-10-CM | POA: Insufficient documentation

## 2015-08-12 DIAGNOSIS — K219 Gastro-esophageal reflux disease without esophagitis: Secondary | ICD-10-CM | POA: Diagnosis not present

## 2015-08-12 DIAGNOSIS — I272 Other secondary pulmonary hypertension: Secondary | ICD-10-CM

## 2015-08-12 DIAGNOSIS — J189 Pneumonia, unspecified organism: Secondary | ICD-10-CM

## 2015-08-12 DIAGNOSIS — N281 Cyst of kidney, acquired: Secondary | ICD-10-CM

## 2015-08-12 DIAGNOSIS — A419 Sepsis, unspecified organism: Secondary | ICD-10-CM

## 2015-08-12 DIAGNOSIS — J439 Emphysema, unspecified: Secondary | ICD-10-CM

## 2015-08-12 LAB — HEPATIC FUNCTION PANEL
ALK PHOS: 64 U/L (ref 39–117)
ALT: 20 U/L (ref 0–35)
AST: 14 U/L (ref 0–37)
Albumin: 4.1 g/dL (ref 3.5–5.2)
BILIRUBIN DIRECT: 0.1 mg/dL (ref 0.0–0.3)
BILIRUBIN TOTAL: 0.6 mg/dL (ref 0.2–1.2)
Total Protein: 6.8 g/dL (ref 6.0–8.3)

## 2015-08-12 LAB — TROPONIN I: TNIDX: 0 ug/l (ref 0.00–0.06)

## 2015-08-12 LAB — TSH: TSH: 0.38 u[IU]/mL (ref 0.35–4.50)

## 2015-08-12 MED ORDER — ASPIRIN EC 81 MG PO TBEC: 81.0000 mg | DELAYED_RELEASE_TABLET | Freq: Every day | ORAL | Status: DC

## 2015-08-12 MED ORDER — NITROGLYCERIN 0.4 MG SL SUBL: 0.4000 mg | SUBLINGUAL_TABLET | SUBLINGUAL | Status: DC | PRN

## 2015-08-12 MED ORDER — NITROGLYCERIN 0.4 MG SL SUBL
0.4000 mg | SUBLINGUAL_TABLET | Freq: Once | SUBLINGUAL | Status: AC
  Administered 2015-08-12: 0.4 mg via SUBLINGUAL

## 2015-08-12 MED ORDER — SUCRALFATE 1 G PO TABS: 1.0000 g | ORAL_TABLET | Freq: Three times a day (TID) | ORAL | Status: DC

## 2015-08-12 MED ORDER — GI COCKTAIL ~~LOC~~
30.0000 mL | Freq: Once | ORAL | Status: AC
  Administered 2015-08-12: 30 mL via ORAL

## 2015-08-12 NOTE — Assessment & Plan Note (Signed)
By CXR/CTA during recent hospitalization. Discussed future spirometry vs pulm referral for PFTs.  Will await further eval until chest pain has been evaluated/resolved. Pt agrees

## 2015-08-12 NOTE — Assessment & Plan Note (Signed)
Echo with peak PA pressures 23mmHg

## 2015-08-12 NOTE — Patient Instructions (Addendum)
EKG today - possible changes from last time. labwork today.  GI cocktail today. Sublingual nitro today.  Stop mucinex.  Continue protonix 40mg  daily. Add carafate. Start aspirin 81mg  daily.  If worsening chest pain, go to ER. In meantime, may take sublingual nitro as needed for chest pain and we will schedule you with cardiologist for further evaluation - pass by Marion's office for this. Ok to wait for PFTs (pulmonary evaluation) until chest pain resolved.

## 2015-08-12 NOTE — Assessment & Plan Note (Signed)
Describes persistent chest and epigastric tightness/pressure after resolved CAP. Concerning for either GERD or cardiac cause - cardiac risk factors include sedentary lifestyle as well as prior smoking history and mild HLD. Check EKG today, TnI and TSH.  GI cocktail and SL nitro provided today - GI cocktail provided more relief.  Start carafate, aspirin 81mg  daily, provided with SL nitro prn. Continue protonix 40mg  daily. Discussed red flags to seek ER care. EKG - NSR rate 75, normal axis, intervals, no acute ST/T changes, ?q waves inferiorly new since EKG 07/28/2015, rsr V1

## 2015-08-12 NOTE — Progress Notes (Signed)
Pre visit review using our clinic review tool, if applicable. No additional management support is needed unless otherwise documented below in the visit note. 

## 2015-08-12 NOTE — Assessment & Plan Note (Signed)
Pt quit smoking 2 wks ago - congratulated on this.

## 2015-08-12 NOTE — Assessment & Plan Note (Signed)
Anticipate chest pain partly GERD related - continue pantoprazole 40mg  daily, add carafate daily.  If no improvement noted, consider referral back to GI.

## 2015-08-12 NOTE — Assessment & Plan Note (Addendum)
Recent hospitalization for CAP with sepsis - completed levaquin course. No further cough. Persistent chest pain - see below. Will need pneumovax.

## 2015-08-12 NOTE — Assessment & Plan Note (Addendum)
by CT consider renal US

## 2015-08-12 NOTE — Progress Notes (Signed)
BP 108/70 mmHg  Pulse 81  Temp(Src) 99 F (37.2 C) (Oral)  Wt 142 lb (64.411 kg)  SpO2 98%   CC: hosp f/u visit  Subjective:    Patient ID: Rachel Mcintosh, female    DOB: 06/14/1956, 59 y.o.   MRN: [id number]  HPI: Rachel Mcintosh is a 59 y.o. female presenting on 08/12/2015 for Hospitalization Follow-up   Recent hospitalization for L sided pleuritic chest pain and dry cough, found to have sepsis with hypoxia and hypotension from LLL CAP. D dimer elevated but CTA negative for acute PE. CTA showed emphysema. Great response to IV abx, transitioned to levaquin. Provided with prn combivent and mucinex, steriod taper for ?COPD. rec outpatient PFTs. Continued smoker, motivated to quit - she quit fully over last 2 weeks. Blood cultures no growth final.   She had normal echo 07/28/2015 with EF 65-70%.   She continues to have substernal chest pain described as pressure tightness and burning/burping. Has been regular with mucinex but feels that this is worsening GERD. She is regular with pantoprazole. Hydrocodone helps pain as well. No jaw pain. + nausea and mild dyspnea. No recent exertion. She did not fill combivent. She has been using albuterol PRN rarely.   Intermittent heat intolerance over the past year. 3 eipsodes in past week. No unexpected weight changes. Hypoalbuminemia in hospital despite normal appetite. Lab Results  Component Value Date   TSH 0.38 08/12/2015     ESOPHAGOGASTRODUODENOSCOPY Date: 04/2015 2cm HH, lower esoph ring dilated [facility]le[facility] Purl)  Admit date: 07/26/2015 Discharge date: 07/31/2015 F/u phone call: 08/01/2015  Recommendations for Outpatient Follow-up:  1. Repeat BMET to follow electrolyte sand renal function 2. Patient will need formal PFT and pulmonary evaluation; please arranged afor it as an outpatient after acute infection treatment is completed  Discharge Diagnoses:  Sepsis due to pneumonia ([name]) Bronchiectasis/CAP (community acquired  pneumonia) Leukocytosis Hypotension Arterial hypotension Hypokalemia Presumed COPD GERD Tobacco abuse  Discharge Condition: stable and improved. Will discharge home with [facility] services (PT) for deconditioning. PCP follow up in 10 days   Diet recommendation: regular diet   PORTABLE CHEST 1 VIEW COMPARISON: CT 07/26/2015. Chest x-ray 07/26/2015.  FINDINGS: Mediastinum and hilar structures normal. Heart size stable. Low lung volumes with bibasilar atelectasis and/or infiltrates/edema. Small bilateral pleural effusions. No pneumothorax. Prior thoracic spine fusion. Postsurgical changes right shoulder.  IMPRESSION: 1. Low lung volumes with bibasilar atelectasis and/or infiltrates/edema. A component of acute congestive heart failure cannot be excluded. The heart is not enlarged. 2. Small bilateral pleural effusions.   Electronically Signed  By: Marcello Moores Register  On: 07/30/2015 07:32  Relevant past medical, surgical, family and social history reviewed and updated as indicated. Interim medical history since our last visit reviewed. Allergies and medications reviewed and updated. Current Outpatient Prescriptions on File Prior to Visit  Medication Sig  . HYDROcodone-acetaminophen (NORCO/VICODIN) 5-325 MG tablet Take 1 tablet by mouth every 6 (six) hours as needed for moderate pain (rare use).  . pantoprazole (PROTONIX) 40 MG tablet Take 1 tablet (40 mg total) by mouth daily.   No current facility-administered medications on file prior to visit.    Review of Systems Per HPI unless specifically indicated in ROS section     Objective:    BP 108/70 mmHg  Pulse 81  Temp(Src) 99 F (37.2 C) (Oral)  Wt 142 lb (64.411 kg)  SpO2 98%  Wt Readings from Last 3 Encounters:  08/12/15 142 lb (64.411 kg)  07/26/15 140 lb (63.504 kg)  04/16/15 140 lb (63.504 kg)    Physical Exam  Constitutional: She appears well-developed and well-nourished. No distress.  HENT:   Mouth/Throat: Oropharynx is clear and moist. No oropharyngeal exudate.  Eyes: Conjunctivae and EOM are normal. Pupils are equal, round, and reactive to light. No scleral icterus.  Cardiovascular: Normal rate, regular rhythm, normal heart sounds and intact distal pulses.   No murmur heard. Pulmonary/Chest: Effort normal and breath sounds normal. No respiratory distress. She has no wheezes. She has no rales.  Abdominal: Soft. Normal appearance and bowel sounds are normal. She exhibits no distension and no mass. There is tenderness (mild) in the epigastric area and left upper quadrant. There is no rigidity, no rebound, no guarding and negative Murphy's sign.  Musculoskeletal: She exhibits no edema.  Skin: Skin is warm and dry. No rash noted.  Psychiatric: She has a normal mood and affect.  Nursing note and vitals reviewed.     Assessment & Plan:   Problem List Items Addressed This Visit    RESOLVED: Sepsis due to pneumonia [facility])    This has resolved.      Renal cysts, acquired, bilateral    by CT consider renal US      Pulmonary hypertension (HCC)    Echo with peak PA pressures 76mmHg      Relevant Medications   aspirin EC 81 MG tablet   nitroGLYCERIN (NITROSTAT) 0.4 MG SL tablet   nitroGLYCERIN (NITROSTAT) SL tablet 0.4 mg (Completed)   Pain in the chest - Primary    Describes persistent chest and epigastric tightness/pressure after resolved CAP. Concerning for either GERD or cardiac cause - cardiac risk factors include sedentary lifestyle as well as prior smoking history and mild HLD. Check EKG today, TnI and TSH.  GI cocktail and SL nitro provided today - GI cocktail provided more relief.  Start carafate, aspirin 81mg  daily, provided with SL nitro prn. Continue protonix 40mg  daily. Discussed red flags to seek ER care. EKG - NSR rate 75, normal axis, intervals, no acute ST/T changes, ?q waves inferiorly new since EKG 07/28/2015, rsr V1      Relevant Medications   gi cocktail  (Maalox,Lidocaine,Donnatal) (Completed)   nitroGLYCERIN (NITROSTAT) SL tablet 0.4 mg (Completed)   Other Relevant Orders   EKG 12-Lead (Completed)   TSH (Completed)   Troponin I (Completed)   Ambulatory referral to Cardiology   GERD (gastroesophageal reflux disease)    Anticipate chest pain partly GERD related - continue pantoprazole 40mg  daily, add carafate daily.  If no improvement noted, consider referral back to GI.      Relevant Medications   gi cocktail (Maalox,Lidocaine,Donnatal) (Completed)   sucralfate (CARAFATE) 1 G tablet   Other Relevant Orders   TSH (Completed)   Ex-smoker    Pt quit smoking 2 wks ago - congratulated on this.      Emphysema/COPD ([place])    By CXR/CTA during recent hospitalization. Discussed future spirometry vs pulm referral for PFTs.  Will await further eval until chest pain has been evaluated/resolved. Pt agrees      CAP (community acquired pneumonia)    Recent hospitalization for CAP with sepsis - completed levaquin course. No further cough. Persistent chest pain - see below. Will need pneumovax.       Other Visit Diagnoses    Hypoalbuminemia        Relevant Orders    TSH (Completed)    Hepatic function panel (Completed)        Follow up plan: Return  if symptoms worsen or fail to improve.

## 2015-08-12 NOTE — Assessment & Plan Note (Signed)
This has resolved.

## 2015-08-15 ENCOUNTER — Telehealth: Payer: Self-pay

## 2015-08-15 ENCOUNTER — Encounter: Payer: Self-pay | Admitting: Cardiovascular Disease

## 2015-08-15 ENCOUNTER — Ambulatory Visit (INDEPENDENT_AMBULATORY_CARE_PROVIDER_SITE_OTHER): Payer: Medicare Other | Admitting: Cardiovascular Disease

## 2015-08-15 VITALS — BP 122/77 | HR 85 | Ht 66.0 in | Wt 142.4 lb

## 2015-08-15 DIAGNOSIS — R072 Precordial pain: Secondary | ICD-10-CM | POA: Diagnosis not present
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
## 2015-08-15 NOTE — Telephone Encounter (Signed)
Pt left v/m; pt was seen 08/12/15; the med that was sent to pharmacy to coat stomach is not helping and pt thinks her problem is related to the bronchial inflammation that pt has chronically. Pt wants to know if med could be called in for that. Gets winded upon exertion. No wheezing,no fever and no SOB unless moving around.slight non prod cough. Pt request cb. [facility].

## 2015-08-15 NOTE — Patient Instructions (Signed)
Your physician has requested that you have a lexiscan myoview. For further information please visit [email or web address]. Please follow instruction sheet, as given.  NO CHANGE IN CURRENT TREATMENT.   Your physician recommends that you schedule a follow-up appointment AS NEEDED BASIS.

## 2015-08-16 MED ORDER — UMECLIDINIUM BROMIDE 62.5 MCG/INH IN AEPB: 1.0000 | INHALATION_SPRAY | Freq: Every day | RESPIRATORY_TRACT | Status: DC

## 2015-08-16 MED ORDER — TIOTROPIUM BROMIDE MONOHYDRATE 18 MCG IN CAPS: 18.0000 ug | ORAL_CAPSULE | Freq: Every day | RESPIRATORY_TRACT | Status: DC

## 2015-08-16 NOTE — Telephone Encounter (Signed)
plz notify I've sent in spiriva inhaler for pt to try nightly to help with shortness of breath. I saw she saw cards yesterday.

## 2015-08-16 NOTE — Telephone Encounter (Signed)
Pt left v/m; pt went to pick up spiriva inhaler and cost to pt was $235.00; pt cannot afford and request different med to [facility].

## 2015-08-16 NOTE — Telephone Encounter (Signed)
Pt called to see if med called in; pt notified as instructed and pt will cb if needed.

## 2015-08-16 NOTE — Addendum Note (Signed)
Addended by: Ria Bush on: 08/16/2015 01:42 PM   Modules accepted: Orders

## 2015-08-16 NOTE — Telephone Encounter (Signed)
All respiratory meds are going to be expensive. Would have her price out incruse ellipta - and we have coupon that expires 12/31 for 1 month supply free placed in [facility] [facility].

## 2015-08-16 NOTE — Telephone Encounter (Signed)
Patient notified and coupon placed up front for pick up. 

## 2015-08-19 ENCOUNTER — Encounter: Payer: Self-pay | Admitting: Cardiovascular Disease

## 2015-08-21 ENCOUNTER — Telehealth (HOSPITAL_COMMUNITY): Payer: Self-pay

## 2015-08-21 NOTE — Telephone Encounter (Signed)
I did attempt to reach pt via [phone]. I did say as I always do, Hello my name is Estill Bamberg I am calling from [facility]. I am trying to Harvie Heck.  The person on the other end stated "That is not me".  I asked if she were available and was told "She is not with me."  I asked who am I speaking with and the response was, "I am not telling you who I am because you did not tell me who you are." My response was yes, I did. I said this is Estill Bamberg I am calling from [facility].  The person/woman on the other end said no you did not.  So as not to enter into an argument with this other person. I did say to the other person that I would try and reach the pt at another time. The person on the other end said "ok". I did thank the person and wish the person a great and I did end the call.

## 2015-08-27 ENCOUNTER — Ambulatory Visit (HOSPITAL_COMMUNITY)
Admission: RE | Admit: 2015-08-27 | Discharge: 2015-08-27 | Disposition: A | Discharge status: Home / Self Care | Payer: Medicare Other | Source: Ambulatory Visit | Attending: Cardiovascular Disease | Admitting: Cardiovascular Disease

## 2015-08-27 DIAGNOSIS — R072 Precordial pain: Secondary | ICD-10-CM | POA: Diagnosis not present

## 2015-08-27 DIAGNOSIS — Z87891 Personal history of nicotine dependence: Secondary | ICD-10-CM | POA: Insufficient documentation

## 2015-08-27 DIAGNOSIS — R0609 Other forms of dyspnea: Secondary | ICD-10-CM | POA: Diagnosis not present

## 2015-08-27 DIAGNOSIS — R0602 Shortness of breath: Secondary | ICD-10-CM | POA: Insufficient documentation

## 2015-08-27 LAB — MYOCARDIAL PERFUSION IMAGING
CHL CUP NUCLEAR SRS: 1
CHL CUP NUCLEAR SSS: 8
CHL CUP RESTING HR STRESS: 74 {beats}/min
LV sys vol: 12 mL
LVDIAVOL: 56 mL
NUC STRESS TID: 1.11
Peak HR: 106 {beats}/min
SDS: 7

## 2015-08-27 MED ORDER — REGADENOSON 0.4 MG/5ML IV SOLN
0.4000 mg | Freq: Once | INTRAVENOUS | Status: AC
  Administered 2015-08-27: 0.4 mg via INTRAVENOUS

## 2015-08-27 MED ORDER — TECHNETIUM TC 99M SESTAMIBI GENERIC - CARDIOLITE
10.4000 | Freq: Once | INTRAVENOUS | Status: AC | PRN
  Administered 2015-08-27: 10 via INTRAVENOUS

## 2015-08-27 MED ORDER — TECHNETIUM TC 99M SESTAMIBI GENERIC - CARDIOLITE
28.2000 | Freq: Once | INTRAVENOUS | Status: AC | PRN
  Administered 2015-08-27: 28.2 via INTRAVENOUS

## 2015-08-29 ENCOUNTER — Telehealth: Payer: Self-pay | Admitting: *Deleted

## 2015-08-29 NOTE — Telephone Encounter (Signed)
Spoke to patient.  Stress test Result given . Verbalized understanding

## 2015-08-29 NOTE — Telephone Encounter (Signed)
-----   Message from Skeet Latch, MD sent at 08/28/2015  7:57 AM EST ----- Normal stress test.

## 2015-11-25 ENCOUNTER — Telehealth: Payer: Self-pay | Admitting: Family Medicine

## 2015-11-25 DIAGNOSIS — Z89612 Acquired absence of left leg above knee: Secondary | ICD-10-CM | POA: Diagnosis not present

## 2015-11-25 DIAGNOSIS — M25552 Pain in left hip: Secondary | ICD-10-CM | POA: Diagnosis not present

## 2015-11-25 NOTE — Telephone Encounter (Signed)
LM for pt to sch AWV with Katha Cabal, [place]

## 2015-12-03 ENCOUNTER — Ambulatory Visit (INDEPENDENT_AMBULATORY_CARE_PROVIDER_SITE_OTHER): Payer: Medicare Other | Admitting: Internal Medicine

## 2015-12-03 ENCOUNTER — Encounter: Payer: Self-pay | Admitting: Internal Medicine

## 2015-12-03 VITALS — BP 120/70 | HR 74 | Temp 98.7°F | Wt 139.0 lb

## 2015-12-03 DIAGNOSIS — M545 Low back pain, unspecified: Secondary | ICD-10-CM

## 2015-12-03 DIAGNOSIS — R350 Frequency of micturition: Secondary | ICD-10-CM

## 2015-12-03 LAB — POC URINALSYSI DIPSTICK (AUTOMATED)
Bilirubin, UA: NEGATIVE
GLUCOSE UA: NEGATIVE
Ketones, UA: NEGATIVE
Leukocytes, UA: NEGATIVE
NITRITE UA: NEGATIVE
PH UA: 6
Protein, UA: NEGATIVE
RBC UA: NEGATIVE
SPEC GRAV UA: 1.015
UROBILINOGEN UA: NEGATIVE

## 2015-12-03 NOTE — Patient Instructions (Signed)

## 2015-12-03 NOTE — Progress Notes (Signed)
Pre visit review using our clinic review tool, if applicable. No additional management support is needed unless otherwise documented below in the visit note. 

## 2015-12-03 NOTE — Progress Notes (Signed)
Subjective:    Patient ID: Rachel Mcintosh, female    DOB: [birth year], 58 y.o.   MRN: [id number]  HPI  Pt presents to the clinic today with a 1 wk h/o lower back pain. The pain is located in the middle of her back and radiates to the side of her back. She describes the pain as achy. It has worsened over the past few days. The pain is worse with sitting still, improved with movement. The pain does wake her up at night. She denies numbness or tingling in her legs.  She denies loss of bowel or bladder. She has not tried anything OTC. She denies any trauma, falls or injury. Only significant PMH includes an HNP repair in 2008.  She is concerned that her back pain is related to urinary problems as she does have some mild frequency. She denies fever, chills or nausea  Review of Systems  Past Medical History  Diagnosis Date  . Hyperlipidemia     controlled by diet  . History of anxiety     situational/stress induced, previously on paxil  . Urine incontinence     "difficulty holding urine"  . GERD (gastroesophageal reflux disease)     doing well off meds  . Cervical spine fracture ([place]) 2008    hx of   . Above knee amputation of left lower extremity ([place]) 2008    disability since then  . DDD (degenerative disc disease), lumbar 2007    s/p surgery  . Ex-smoker   . Renal cysts, acquired, bilateral 2008    by CT rec renal US    Current Outpatient Prescriptions  Medication Sig Dispense Refill  . doxycycline (VIBRAMYCIN) 100 MG capsule Take 1 capsule by mouth 2 (two) times daily.    Marland Kitchen HYDROcodone-acetaminophen (NORCO/VICODIN) 5-325 MG tablet Take 1 tablet by mouth every 6 (six) hours as needed for moderate pain (rare use). 20 tablet 0  . nitroGLYCERIN (NITROSTAT) 0.4 MG SL tablet Place 1 tablet (0.4 mg total) under the tongue every 5 (five) minutes as needed for chest pain. 20 tablet 0  . pantoprazole (PROTONIX) 40 MG tablet Take 1 tablet (40 mg total) by mouth daily. 60 tablet 3   No  current facility-administered medications for this visit.    Allergies  Allergen Reactions  . Codeine Nausea And Vomiting  . Doxycycline Other (See Comments)    Worsened GERD  . Sulfa Antibiotics Other (See Comments)    Unsure of allergy    Family History  Problem Relation Age of Onset  . Hyperlipidemia Mother   . Hyperlipidemia Father   . Prostate cancer Paternal Grandfather   . Cancer Other     breast (great grandmother)  . CAD Maternal Uncle 40    MI  . Stroke Neg Hx   . Diabetes Neg Hx   . Breast cancer Maternal Aunt     Social History   Social History  . Marital Status: Single    Spouse Name: N/A  . Number of Children: 2  . Years of Education: N/A   Occupational History  . Not on file.   Social History Main Topics  . Smoking status: Former Smoker -- 0.50 packs/day for 35 years    Types: Cigarettes    Quit date: 08/01/2015  . Smokeless tobacco: Never Used     Comment: pt declined  . Alcohol Use: 0.0 oz/week    0 Standard drinks or equivalent per week     Comment: social  .  Drug Use: No  . Sexual Activity: Not on file   Other Topics Concern  . 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  Respiratory: Denies difficulty breathing, shortness of breath, or cough.   Cardiovascular: Denies chest pain, or chest tightness, palpitations.  Musculoskeletal: Pt reports back pain. Denies decrease in range of motion, difficulty with gait or other joint pain.  Skin: Denies redness, rashes, lesions or ulcercations.    No other specific complaints in a complete review of systems (except as listed in HPI above).     Objective:   Physical Exam  BP 120/70 mmHg  Pulse 74  Temp(Src) 98.7 F (37.1 C) (Oral)  Wt 139 lb (63.05 kg)   SpO2 97% Wt Readings from Last 3 Encounters:  12/03/15 139 lb (63.05 kg)  08/27/15 142 lb (64.411 kg)  08/15/15 142 lb 6.4 oz (64.592 kg)    General: Appears her stated age, well developed, well nourished in NAD. Skin: Warm, dry and intact. Large scar noted over lumbar spine. Cardiovascular: Normal rate and rhythm. S1,S2 noted.  No murmur, rubs or gallops noted.  Pulmonary/Chest: Normal effort and positive vesicular breath sounds. No respiratory distress. No wheezes, rales or ronchi noted.  Musculoskeletal: Normal range of motion. TTP along the spine in the lumbar region with pain radiating along the lower back bilaterally. No signs of redness swelling or bruising.  No difficulty with gait, but pt does have a left AKA with a prosthetic.  Neurological: Alert and oriented.  BMET    Component Value Date/Time   NA 141 07/30/2015 0300   K 4.0 07/30/2015 0300   CL 108 07/30/2015 0300   CO2 26 07/30/2015 0300   GLUCOSE 139* 07/30/2015 0300   BUN 5* 07/30/2015 0300   CREATININE 0.55 07/30/2015 0300   CALCIUM 8.5* 07/30/2015 0300   GFRNONAA >60 07/30/2015 0300   GFRAA >60 07/30/2015 0300    Lipid Panel     Component Value Date/Time   CHOL 241* 02/09/2014 1216   TRIG 120.0 02/09/2014 1216   HDL 52.90 02/09/2014 1216   CHOLHDL 5 02/09/2014 1216   VLDL 24.0 02/09/2014 1216   LDLCALC 164* 02/09/2014 1216    CBC    Component Value Date/Time   WBC 8.7 07/30/2015 0300   RBC 3.68* 07/30/2015 0300   RBC 3.68* 07/30/2015 0300   HGB 10.6* 07/30/2015 0300   HCT 32.5* 07/30/2015 0300   PLT 272 07/30/2015 0300   MCV 88.3 07/30/2015 0300   MCH 28.8 07/30/2015 0300   MCHC 32.6 07/30/2015 0300   RDW 13.6 07/30/2015 0300   LYMPHSABS 1.3 07/29/2015 0227   MONOABS 0.8 07/29/2015 0227   EOSABS 0.0 07/29/2015 0227   BASOSABS 0.0 07/29/2015 0227       Assessment & Plan:   Lumbar muscle strain  Pt to start sx control with heat, Ibuprofen, and gentles stretches described in  handout. Urinalysis normal  RTC if sxs worsen

## 2015-12-23 ENCOUNTER — Encounter: Payer: Self-pay | Admitting: Internal Medicine

## 2015-12-23 ENCOUNTER — Ambulatory Visit (INDEPENDENT_AMBULATORY_CARE_PROVIDER_SITE_OTHER): Payer: Medicare Other | Admitting: Internal Medicine

## 2015-12-23 VITALS — BP 124/84 | HR 81 | Temp 98.1°F | Wt 143.0 lb

## 2015-12-23 DIAGNOSIS — R1011 Right upper quadrant pain: Secondary | ICD-10-CM

## 2015-12-23 NOTE — Progress Notes (Signed)
Pre visit review using our clinic review tool, if applicable. No additional management support is needed unless otherwise documented below in the visit note. 

## 2015-12-23 NOTE — Patient Instructions (Signed)

## 2015-12-23 NOTE — Progress Notes (Signed)
Subjective:    Patient ID: Rachel Mcintosh, female    DOB: 1956/03/11, 60 y.o.   MRN: [id number]  HPI  Pt presents to the clinic today with c/o RUQ abdominal pain and right shoulder pain. This started 1 week ago. She describes the pain as "nagging" with intermittent bursts of sharp pain. She can reproduce the pain with deep breaths and certain movements. The pain does not correlate with eating. She has a h/o GERD and pleuritis but is not sure that this feels like either one of those. Her GERD has been worse recently due to eating unhealthy foods. She denies fever. She reports some increased nausea, but no diarrhea or constipation. She has no h/o gallstones or gallbladder disease. She has been taking Ibuprofen with minimal relief.   Review of Systems   Past Medical History  Diagnosis Date  . Hyperlipidemia     controlled by diet  . History of anxiety     situational/stress induced, previously on paxil  . Urine incontinence     "difficulty holding urine"  . GERD (gastroesophageal reflux disease)     doing well off meds  . Cervical spine fracture ([place]) 2008    hx of   . Above knee amputation of left lower extremity ([place]) 2008    disability since then  . DDD (degenerative disc disease), lumbar 2007    s/p surgery  . Ex-smoker   . Renal cysts, acquired, bilateral 2008    by CT rec renal US    Current Outpatient Prescriptions  Medication Sig Dispense Refill  . HYDROcodone-acetaminophen (NORCO/VICODIN) 5-325 MG tablet Take 1 tablet by mouth every 6 (six) hours as needed for moderate pain (rare use). 20 tablet 0  . nitroGLYCERIN (NITROSTAT) 0.4 MG SL tablet Place 1 tablet (0.4 mg total) under the tongue every 5 (five) minutes as needed for chest pain. 20 tablet 0  . pantoprazole (PROTONIX) 40 MG tablet Take 1 tablet (40 mg total) by mouth daily. 60 tablet 3   No current facility-administered medications for this visit.    Allergies  Allergen Reactions  . Codeine Nausea And  Vomiting  . Doxycycline Other (See Comments)    Worsened GERD  . Sulfa Antibiotics Other (See Comments)    Unsure of allergy    Family History  Problem Relation Age of Onset  . Hyperlipidemia Mother   . Hyperlipidemia Father   . Prostate cancer Paternal Grandfather   . Cancer Other     breast (great grandmother)  . CAD Maternal Uncle 40    MI  . Stroke Neg Hx   . Diabetes Neg Hx   . Breast cancer Maternal Aunt     Social History   Social History  . Marital Status: Single    Spouse Name: N/A  . Number of Children: 2  . Years of Education: N/A   Occupational History  . Not on file.   Social History Main Topics  . Smoking status: Former Smoker -- 0.50 packs/day for 35 years    Types: Cigarettes    Quit date: 08/01/2015  . Smokeless tobacco: Never Used     Comment: pt declined  . Alcohol Use: 0.0 oz/week    0 Standard drinks or equivalent per week     Comment: social  . Drug Use: No  . Sexual Activity: Not on file   Other Topics Concern  . 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  Respiratory: Denies difficulty breathing, shortness of breath, or cough Cardiovascular: Denies chest pain, chest tightness, palpitations or swelling in the hands or feet.  Gastrointestinal: Pt reports RUQ pn radiating to right scapula. Some increased nausea. Denies bloating, constipation, diarrhea or blood in the stool.  GU: Denies urgency, frequency, pain with urination, burning sensation, blood in urine, odor or discharge.  No other specific complaints in a complete review of systems (except as listed in HPI above).     Objective:   Physical Exam  BP 124/84 mmHg  Pulse 81  Temp(Src) 98.1 F (36.7 C) (Oral)  Wt 143 lb (64.864 kg)  SpO2  99% Wt Readings from Last 3 Encounters:  12/23/15 143 lb (64.864 kg)  12/03/15 139 lb (63.05 kg)  08/27/15 142 lb (64.411 kg)    General: Appears her stated age, well developed, well nourished in NAD. Cardiovascular: Normal rate and rhythm. S1,S2 noted.  No murmur, rubs or gallops noted.  Pulmonary/Chest: Normal effort and positive vesicular breath sounds. No respiratory distress. No wheezes, rales or ronchi noted.  Abdomen: Soft and tender in the RUQ. Normal bowel sounds. No distention or masses noted. Liver, spleen and kidneys non palpable. ? Murphy's sign. Musculoskeletal:  No TTP of chest wall or scapula.     BMET    Component Value Date/Time   NA 141 07/30/2015 0300   K 4.0 07/30/2015 0300   CL 108 07/30/2015 0300   CO2 26 07/30/2015 0300   GLUCOSE 139* 07/30/2015 0300   BUN 5* 07/30/2015 0300   CREATININE 0.55 07/30/2015 0300   CALCIUM 8.5* 07/30/2015 0300   GFRNONAA >60 07/30/2015 0300   GFRAA >60 07/30/2015 0300    Lipid Panel     Component Value Date/Time   CHOL 241* 02/09/2014 1216   TRIG 120.0 02/09/2014 1216   HDL 52.90 02/09/2014 1216   CHOLHDL 5 02/09/2014 1216   VLDL 24.0 02/09/2014 1216   LDLCALC 164* 02/09/2014 1216    CBC    Component Value Date/Time   WBC 8.7 07/30/2015 0300   RBC 3.68* 07/30/2015 0300   RBC 3.68* 07/30/2015 0300   HGB 10.6* 07/30/2015 0300   HCT 32.5* 07/30/2015 0300   PLT 272 07/30/2015 0300   MCV 88.3 07/30/2015 0300   MCH 28.8 07/30/2015 0300   MCHC 32.6 07/30/2015 0300   RDW 13.6 07/30/2015 0300   LYMPHSABS 1.3 07/29/2015 0227   MONOABS 0.8 07/29/2015 0227   EOSABS 0.0 07/29/2015 0227   BASOSABS 0.0 07/29/2015 0227    Hgb A1C No results found for: HGBA1C       Assessment & Plan:   RUQ abdominal pain:  ? Gallbladder disease Check CBC, CMET, amylase, lipase Can take Norco daily prn for pain  Will follow up after labs, RTC as needed

## 2015-12-24 LAB — COMPREHENSIVE METABOLIC PANEL
ALBUMIN: 4.3 g/dL (ref 3.5–5.2)
ALT: 13 U/L (ref 0–35)
AST: 15 U/L (ref 0–37)
Alkaline Phosphatase: 66 U/L (ref 39–117)
BILIRUBIN TOTAL: 0.3 mg/dL (ref 0.2–1.2)
BUN: 6 mg/dL (ref 6–23)
CALCIUM: 9.8 mg/dL (ref 8.4–10.5)
CHLORIDE: 102 meq/L (ref 96–112)
CO2: 30 meq/L (ref 19–32)
CREATININE: 0.63 mg/dL (ref 0.40–1.20)
GFR: 103.35 mL/min (ref >60.00)
Glucose, Bld: 99 mg/dL (ref 70–99)
Potassium: 3.7 mEq/L (ref 3.5–5.1)
SODIUM: 141 meq/L (ref 135–145)
Total Protein: 7.5 g/dL (ref 6.0–8.3)

## 2015-12-24 LAB — CBC
HCT: 39.3 % (ref 36.0–46.0)
HEMOGLOBIN: 13.2 g/dL (ref 12.0–15.0)
MCHC: 33.6 g/dL (ref 30.0–36.0)
MCV: 82.7 fl (ref 78.0–100.0)
PLATELETS: 375 10*3/uL (ref 150.0–400.0)
RBC: 4.76 Mil/uL (ref 3.87–5.11)
RDW: 13.2 % (ref 11.5–15.5)
WBC: 7.4 10*3/uL (ref 4.0–10.5)

## 2015-12-24 LAB — AMYLASE: AMYLASE: 37 U/L (ref 27–131)

## 2015-12-24 LAB — LIPASE: LIPASE: 41 U/L (ref 11.0–59.0)

## 2016-01-20 ENCOUNTER — Other Ambulatory Visit: Payer: Self-pay

## 2016-01-20 ENCOUNTER — Other Ambulatory Visit: Payer: Self-pay | Admitting: Family Medicine

## 2016-01-20 DIAGNOSIS — Z1231 Encounter for screening mammogram for malignant neoplasm of breast: Secondary | ICD-10-CM

## 2016-01-30 DIAGNOSIS — M8580 Other specified disorders of bone density and structure, unspecified site: Secondary | ICD-10-CM

## 2016-01-30 DIAGNOSIS — I700 Atherosclerosis of aorta: Secondary | ICD-10-CM | POA: Insufficient documentation

## 2016-01-30 DIAGNOSIS — Z95828 Presence of other vascular implants and grafts: Secondary | ICD-10-CM | POA: Insufficient documentation

## 2016-01-30 DIAGNOSIS — M810 Age-related osteoporosis without current pathological fracture: Secondary | ICD-10-CM | POA: Insufficient documentation

## 2016-01-30 HISTORY — DX: Atherosclerosis of aorta: I70.0

## 2016-01-30 HISTORY — DX: Other specified disorders of bone density and structure, unspecified site: M85.80

## 2016-02-03 ENCOUNTER — Ambulatory Visit
Admission: RE | Admit: 2016-02-03 | Discharge: 2016-02-03 | Disposition: A | Discharge status: Home / Self Care | Payer: Medicare Other | Source: Ambulatory Visit

## 2016-02-03 DIAGNOSIS — Z1231 Encounter for screening mammogram for malignant neoplasm of breast: Secondary | ICD-10-CM | POA: Diagnosis not present

## 2016-02-04 LAB — HM MAMMOGRAPHY

## 2016-02-05 ENCOUNTER — Encounter: Payer: Self-pay | Admitting: *Deleted

## 2016-02-06 ENCOUNTER — Ambulatory Visit (INDEPENDENT_AMBULATORY_CARE_PROVIDER_SITE_OTHER)
Admission: RE | Admit: 2016-02-06 | Discharge: 2016-02-06 | Disposition: A | Discharge status: Home / Self Care | Payer: Medicare Other | Source: Ambulatory Visit | Attending: Family Medicine | Admitting: Family Medicine

## 2016-02-06 ENCOUNTER — Ambulatory Visit (INDEPENDENT_AMBULATORY_CARE_PROVIDER_SITE_OTHER): Payer: Medicare Other | Admitting: Family Medicine

## 2016-02-06 ENCOUNTER — Encounter: Payer: Self-pay | Admitting: Family Medicine

## 2016-02-06 VITALS — BP 136/78 | HR 70 | Temp 98.1°F | Wt 142.5 lb

## 2016-02-06 DIAGNOSIS — M545 Low back pain, unspecified: Secondary | ICD-10-CM | POA: Insufficient documentation

## 2016-02-06 DIAGNOSIS — Z89612 Acquired absence of left leg above knee: Secondary | ICD-10-CM

## 2016-02-06 DIAGNOSIS — M47816 Spondylosis without myelopathy or radiculopathy, lumbar region: Secondary | ICD-10-CM | POA: Diagnosis not present

## 2016-02-06 DIAGNOSIS — F4323 Adjustment disorder with mixed anxiety and depressed mood: Secondary | ICD-10-CM

## 2016-02-06 DIAGNOSIS — R32 Unspecified urinary incontinence: Secondary | ICD-10-CM

## 2016-02-06 DIAGNOSIS — M5136 Other intervertebral disc degeneration, lumbar region: Secondary | ICD-10-CM | POA: Diagnosis not present

## 2016-02-06 DIAGNOSIS — N3946 Mixed incontinence: Secondary | ICD-10-CM | POA: Insufficient documentation

## 2016-02-06 DIAGNOSIS — S78112A Complete traumatic amputation at level between left hip and knee, initial encounter: Secondary | ICD-10-CM

## 2016-02-06 DIAGNOSIS — N393 Stress incontinence (female) (male): Secondary | ICD-10-CM | POA: Insufficient documentation

## 2016-02-06 LAB — POC URINALSYSI DIPSTICK (AUTOMATED)
BILIRUBIN UA: NEGATIVE
Blood, UA: NEGATIVE
GLUCOSE UA: NEGATIVE
Ketones, UA: NEGATIVE
LEUKOCYTES UA: NEGATIVE
Nitrite, UA: NEGATIVE
Protein, UA: NEGATIVE
Spec Grav, UA: 1.01
Urobilinogen, UA: 0.2
pH, UA: 6

## 2016-02-06 MED ORDER — HYDROXYZINE HCL 25 MG PO TABS: 12.5000 mg | ORAL_TABLET | Freq: Two times a day (BID) | ORAL | Status: DC | PRN

## 2016-02-06 MED ORDER — NAPROXEN 500 MG PO TABS: ORAL_TABLET | ORAL | Status: DC

## 2016-02-06 MED ORDER — METHOCARBAMOL 500 MG PO TABS: 500.0000 mg | ORAL_TABLET | Freq: Three times a day (TID) | ORAL | Status: DC | PRN

## 2016-02-06 NOTE — Patient Instructions (Signed)
Xray of lumbar spine today. Treat with naprosyn twice daily with food for 5 days then as needed. May try muscle relaxant as well (robaxin).  Do kegel exercises and let us know if you'd like to try medicine for incontinence issue.  Trial hydroxyzine for anxiety as needed - caution it may make you sleepy. Let us know how you're doing with above.

## 2016-02-06 NOTE — Progress Notes (Signed)
BP 136/78 mmHg  Pulse 70  Temp(Src) 98.1 F (36.7 C) (Oral)  Wt 142 lb 8 oz (64.638 kg)  SpO2 98%   CC: back pain  Subjective:    Patient ID: Rachel Mcintosh, female    DOB: 07/19/1956, 60 y.o.   MRN: [id number]  HPI: Rachel Mcintosh is a 60 y.o. female presenting on 02/06/2016 for Back Pain   Presents with 17mo h/o painful knot in lower back worse with prolonged sitting. Some radiation to bilateral flanks. No shooting pain down legs, numbness or weakness of legs, bowel accidents, fevers/chills. Now noticing increasingly frequent accidents in the morning, both stress and urge incontinence symptoms. Dribbling leaks. Mild pressure with voiding but no burning or hematuria.   Seen 11/2015 with similar back pain associated with urinary frequency. Diagnosed with lumbar strain treated with heat, ibuprofen, gentle stretching. UA was normal at that time. Has treated with 2 bottles of ibuprofen, no long lasting improvement. Heating had has not helped either.  H/o lumbar herniated disc surgery 2007 Vertell Limber).   Interested in trial anxiety medication - increased stress at home. 67 yo parents live next door, daughter lives with her. Prior on paxil, does not want to restart.   Relevant past medical, surgical, family and social history reviewed and updated as indicated. Interim medical history since our last visit reviewed. Allergies and medications reviewed and updated. Current Outpatient Prescriptions on File Prior to Visit  Medication Sig  . HYDROcodone-acetaminophen (NORCO/VICODIN) 5-325 MG tablet Take 1 tablet by mouth every 6 (six) hours as needed for moderate pain (rare use).  . nitroGLYCERIN (NITROSTAT) 0.4 MG SL tablet Place 1 tablet (0.4 mg total) under the tongue every 5 (five) minutes as needed for chest pain.  . pantoprazole (PROTONIX) 40 MG tablet TAKE ONE TABLET BY MOUTH ONCE DAILY   No current facility-administered medications on file prior to visit.    Review of Systems Per HPI unless  specifically indicated in ROS section     Objective:    BP 136/78 mmHg  Pulse 70  Temp(Src) 98.1 F (36.7 C) (Oral)  Wt 142 lb 8 oz (64.638 kg)  SpO2 98%  Wt Readings from Last 3 Encounters:  02/06/16 142 lb 8 oz (64.638 kg)  12/23/15 143 lb (64.864 kg)  12/03/15 139 lb (63.05 kg)    Physical Exam  Constitutional: She is oriented to person, place, and time. She appears well-developed and well-nourished. No distress.  HENT:  Head: Normocephalic and atraumatic.  Abdominal: Soft. Bowel sounds are normal. She exhibits no distension and no mass. There is no tenderness. There is no rebound and no guarding.  Musculoskeletal: She exhibits no edema.  S/p L AKA with prosthesis in place + pain midline lower lumbar spine Mild paraspinous mm tenderness Neg SLR bilaterally. No pain with int/ext rotation at hip. Neg FABER. Tender to palpation at SIJ, no pain at GTB or sciatic notch bilaterally.   Neurological: She is alert and oriented to person, place, and time.  Skin: Skin is warm and dry. No rash noted.  Psychiatric: Her mood appears anxious.  Tearful with discussion of stressors  Nursing note and vitals reviewed.  Results for orders placed or performed in visit on 02/06/16  POCT Urinalysis Dipstick (Automated)  Result Value Ref Range   Color, UA Light Yellow    Clarity, UA Clear    Glucose, UA Negative    Bilirubin, UA Negative    Ketones, UA Negative    Spec Grav, UA 1.010  Blood, UA Negative    pH, UA 6.0    Protein, UA Negative    Urobilinogen, UA 0.2    Nitrite, UA Negative    Leukocytes, UA Negative Negative      Assessment & Plan:   Problem List Items Addressed This Visit    Above knee amputation of left lower extremity (HCC)   DDD (degenerative disc disease), lumbar   Relevant Medications   naproxen (NAPROSYN) 500 MG tablet   methocarbamol (ROBAXIN) 500 MG tablet   Low back pain - Primary    Ongoing over 2 months, h/o lumbar surgery. Check lumbar and SIJ  films today.  Anticipate ongoing lumbar sprain vs possible recurrent herniated disc although denies radiculopathy.  Treat with naprosyn 500mg  BID x 1wk then PRN, robaxin muscle relaxant, continue heat. Update if not improving with this, consider PT course.      Relevant Medications   naproxen (NAPROSYN) 500 MG tablet   methocarbamol (ROBAXIN) 500 MG tablet   Other Relevant Orders   POCT Urinalysis Dipstick (Automated) (Completed)   DG Lumbar Spine Complete   DG Mcintosh Joints   Urinary incontinence    Both stress and urge symptoms. UA clear today.  Discussed kegel exercises for stress incontinence, scheduled bathroom breaks. Discussed pharmacotherapy for urge incontinence, pt desires to just monitor for now.       Adjustment disorder with mixed anxiety and depressed mood    Support provided. Discussed importance of relaxation techniques. Hopeful for improvement in stress when daughter and her family move out (just bought house) Treat with PRN hydroxyzine, discussed sedation precautions.           Follow up plan: Return if symptoms worsen or fail to improve.  Ria Bush, MD

## 2016-02-06 NOTE — Assessment & Plan Note (Signed)
Both stress and urge symptoms. UA clear today.  Discussed kegel exercises for stress incontinence, scheduled bathroom breaks. Discussed pharmacotherapy for urge incontinence, pt desires to just monitor for now.

## 2016-02-06 NOTE — Progress Notes (Signed)
Pre visit review using our clinic review tool, if applicable. No additional management support is needed unless otherwise documented below in the visit note. 

## 2016-02-06 NOTE — Assessment & Plan Note (Signed)
Ongoing over 2 months, h/o lumbar surgery. Check lumbar and SIJ films today.  Anticipate ongoing lumbar sprain vs possible recurrent herniated disc although denies radiculopathy.  Treat with naprosyn 500mg  BID x 1wk then PRN, robaxin muscle relaxant, continue heat. Update if not improving with this, consider PT course.

## 2016-02-06 NOTE — Assessment & Plan Note (Addendum)
Support provided. Discussed importance of relaxation techniques. Hopeful for improvement in stress when daughter and her family move out (just bought house) Treat with PRN hydroxyzine, discussed sedation precautions.

## 2016-02-07 ENCOUNTER — Other Ambulatory Visit: Payer: Self-pay | Admitting: Family Medicine

## 2016-02-07 ENCOUNTER — Encounter: Payer: Self-pay | Admitting: Family Medicine

## 2016-02-07 MED ORDER — CALCIUM CARBONATE-VITAMIN D3 600-400 MG-UNIT PO TABS: 1.0000 | ORAL_TABLET | Freq: Every day | ORAL | Status: AC

## 2016-03-11 ENCOUNTER — Other Ambulatory Visit (INDEPENDENT_AMBULATORY_CARE_PROVIDER_SITE_OTHER): Payer: Medicare Other

## 2016-03-11 ENCOUNTER — Other Ambulatory Visit: Payer: Self-pay | Admitting: Family Medicine

## 2016-03-11 DIAGNOSIS — E538 Deficiency of other specified B group vitamins: Secondary | ICD-10-CM | POA: Diagnosis not present

## 2016-03-11 DIAGNOSIS — E785 Hyperlipidemia, unspecified: Secondary | ICD-10-CM

## 2016-03-11 DIAGNOSIS — Z1159 Encounter for screening for other viral diseases: Secondary | ICD-10-CM

## 2016-03-11 LAB — LIPID PANEL
Cholesterol: 249 mg/dL — ABNORMAL HIGH (ref 0–200)
HDL: 60.7 mg/dL (ref >39.00)
LDL Cholesterol: 171 mg/dL — ABNORMAL HIGH (ref 0–99)
NONHDL: 188.17
Total CHOL/HDL Ratio: 4
Triglycerides: 88 mg/dL (ref 0.0–149.0)
VLDL: 17.6 mg/dL (ref 0.0–40.0)

## 2016-03-11 LAB — TSH: TSH: 0.66 u[IU]/mL (ref 0.35–4.50)

## 2016-03-11 LAB — VITAMIN B12: VITAMIN B 12: 301 pg/mL (ref 211–911)

## 2016-03-12 LAB — HEPATITIS C ANTIBODY: HCV Ab: NEGATIVE

## 2016-03-17 ENCOUNTER — Encounter: Payer: Self-pay | Admitting: Family Medicine

## 2016-03-17 ENCOUNTER — Ambulatory Visit (INDEPENDENT_AMBULATORY_CARE_PROVIDER_SITE_OTHER): Payer: Medicare Other | Admitting: Family Medicine

## 2016-03-17 VITALS — BP 110/70 | HR 76 | Temp 98.3°F | Ht 66.0 in | Wt 140.2 lb

## 2016-03-17 DIAGNOSIS — Z89612 Acquired absence of left leg above knee: Secondary | ICD-10-CM

## 2016-03-17 DIAGNOSIS — Z0000 Encounter for general adult medical examination without abnormal findings: Secondary | ICD-10-CM

## 2016-03-17 DIAGNOSIS — E785 Hyperlipidemia, unspecified: Secondary | ICD-10-CM | POA: Diagnosis not present

## 2016-03-17 DIAGNOSIS — E538 Deficiency of other specified B group vitamins: Secondary | ICD-10-CM

## 2016-03-17 DIAGNOSIS — M8580 Other specified disorders of bone density and structure, unspecified site: Secondary | ICD-10-CM

## 2016-03-17 DIAGNOSIS — I700 Atherosclerosis of aorta: Secondary | ICD-10-CM

## 2016-03-17 DIAGNOSIS — S78112A Complete traumatic amputation at level between left hip and knee, initial encounter: Secondary | ICD-10-CM

## 2016-03-17 DIAGNOSIS — Z7189 Other specified counseling: Secondary | ICD-10-CM | POA: Insufficient documentation

## 2016-03-17 MED ORDER — HYDROCODONE-ACETAMINOPHEN 5-325 MG PO TABS: 1.0000 | ORAL_TABLET | Freq: Four times a day (QID) | ORAL | Status: DC | PRN

## 2016-03-17 MED ORDER — CYANOCOBALAMIN 500 MCG PO TABS: 500.0000 ug | ORAL_TABLET | Freq: Every day | ORAL | Status: DC

## 2016-03-17 NOTE — Assessment & Plan Note (Signed)
Prn rare hydrocodone refilled today.

## 2016-03-17 NOTE — Assessment & Plan Note (Signed)
Reviewed recent cholesterol levels. ASCVD 10 yr risk = 2.1% Discussed increased legumes to improve LDL.

## 2016-03-17 NOTE — Assessment & Plan Note (Signed)
rec start 58mcg B12 OTC a few days a week.

## 2016-03-17 NOTE — Assessment & Plan Note (Signed)
Consider aspirin.

## 2016-03-17 NOTE — Assessment & Plan Note (Signed)
Advanced directive - does not have set up. Daughter would be HCPOA. Planning on setting this up. Packet provided today.

## 2016-03-17 NOTE — Progress Notes (Signed)
Pre visit review using our clinic review tool, if applicable. No additional management support is needed unless otherwise documented below in the visit note. 

## 2016-03-17 NOTE — Assessment & Plan Note (Signed)
Continue calcium/vit D daily.  

## 2016-03-17 NOTE — Assessment & Plan Note (Signed)

## 2016-03-17 NOTE — Patient Instructions (Addendum)
Advanced directive packet provided today.  You are doing well today. Try to increase legumes in diet.  Return as needed or in 1 year for next medicare wellness visit  Health Maintenance, Female Adopting a healthy lifestyle and getting preventive care can go a long way to promote health and wellness. Talk with your health care provider about what schedule of regular examinations is right for you. This is a good chance for you to check in with your provider about disease prevention and staying healthy. In between checkups, there are plenty of things you can do on your own. Experts have done a lot of research about which lifestyle changes and preventive measures are most likely to keep you healthy. Ask your health care provider for more information. WEIGHT AND DIET  Eat a healthy diet  Be sure to include plenty of vegetables, fruits, low-fat dairy products, and lean protein.  Do not eat a lot of foods high in solid fats, added sugars, or salt.  Get regular exercise. This is one of the most important things you can do for your health.  Most adults should exercise for at least 150 minutes each week. The exercise should increase your heart rate and make you sweat (moderate-intensity exercise).  Most adults should also do strengthening exercises at least twice a week. This is in addition to the moderate-intensity exercise.  Maintain a healthy weight  Body mass index (BMI) is a measurement that can be used to identify possible weight problems. It estimates body fat based on height and weight. Your health care provider can help determine your BMI and help you achieve or maintain a healthy weight.  For females 26 years of age and older:   A BMI below 18.5 is considered underweight.  A BMI of 18.5 to 24.9 is normal.  A BMI of 25 to 29.9 is considered overweight.  A BMI of 30 and above is considered obese.  Watch levels of cholesterol and blood lipids  You should start having your blood  tested for lipids and cholesterol at 58 years of age, then have this test every 5 years.  You may need to have your cholesterol levels checked more often if:  Your lipid or cholesterol levels are high.  You are older than 58 years of age.  You are at high risk for heart disease.  CANCER SCREENING   Lung Cancer  Lung cancer screening is recommended for adults 59-72 years old who are at high risk for lung cancer because of a history of smoking.  A yearly low-dose CT scan of the lungs is recommended for people who:  Currently smoke.  Have quit within the past 15 years.  Have at least a 30-pack-year history of smoking. A pack year is smoking an average of one pack of cigarettes a day for 1 year.  Yearly screening should continue until it has been 15 years since you quit.  Yearly screening should stop if you develop a health problem that would prevent you from having lung cancer treatment.  Breast Cancer  Practice breast self-awareness. This means understanding how your breasts normally appear and feel.  It also means doing regular breast self-exams. Let your health care provider know about any changes, no matter how small.  If you are in your 20s or 30s, you should have a clinical breast exam (CBE) by a health care provider every 1-3 years as part of a regular health exam.  If you are 57 or older, have a CBE every year.  Also consider having a breast X-ray (mammogram) every year.  If you have a family history of breast cancer, talk to your health care provider about genetic screening.  If you are at high risk for breast cancer, talk to your health care provider about having an MRI and a mammogram every year.  Breast cancer gene (BRCA) assessment is recommended for women who have family members with BRCA-related cancers. BRCA-related cancers include:  Breast.  Ovarian.  Tubal.  Peritoneal cancers.  Results of the assessment will determine the need for genetic counseling  and BRCA1 and BRCA2 testing. Cervical Cancer Your health care provider may recommend that you be screened regularly for cancer of the pelvic organs (ovaries, uterus, and vagina). This screening involves a pelvic examination, including checking for microscopic changes to the surface of your cervix (Pap test). You may be encouraged to have this screening done every 3 years, beginning at age 52.  For women ages 65-65, health care providers may recommend pelvic exams and Pap testing every 3 years, or they may recommend the Pap and pelvic exam, combined with testing for human papilloma virus (HPV), every 5 years. Some types of HPV increase your risk of cervical cancer. Testing for HPV may also be done on women of any age with unclear Pap test results.  Other health care providers may not recommend any screening for nonpregnant women who are considered low risk for pelvic cancer and who do not have symptoms. Ask your health care provider if a screening pelvic exam is right for you.  If you have had past treatment for cervical cancer or a condition that could lead to cancer, you need Pap tests and screening for cancer for at least 20 years after your treatment. If Pap tests have been discontinued, your risk factors (such as having a new sexual partner) need to be reassessed to determine if screening should resume. Some women have medical problems that increase the chance of getting cervical cancer. In these cases, your health care provider may recommend more frequent screening and Pap tests. Colorectal Cancer  This type of cancer can be detected and often prevented.  Routine colorectal cancer screening usually begins at 58 years of age and continues through 58 years of age.  Your health care provider may recommend screening at an earlier age if you have risk factors for colon cancer.  Your health care provider may also recommend using home test kits to check for hidden blood in the stool.  A small camera  at the end of a tube can be used to examine your colon directly (sigmoidoscopy or colonoscopy). This is done to check for the earliest forms of colorectal cancer.  Routine screening usually begins at age 74.  Direct examination of the colon should be repeated every 5-10 years through 58 years of age. However, you may need to be screened more often if early forms of precancerous polyps or small growths are found. Skin Cancer  Check your skin from head to toe regularly.  Tell your health care provider about any new moles or changes in moles, especially if there is a change in a mole's shape or color.  Also tell your health care provider if you have a mole that is larger than the size of a pencil eraser.  Always use sunscreen. Apply sunscreen liberally and repeatedly throughout the day.  Protect yourself by wearing long sleeves, pants, a wide-brimmed hat, and sunglasses whenever you are outside. HEART DISEASE, DIABETES, AND HIGH BLOOD PRESSURE  High blood pressure causes heart disease and increases the risk of stroke. High blood pressure is more likely to develop in:  People who have blood pressure in the high end of the normal range (130-139/85-89 mm Hg).  People who are overweight or obese.  People who are African American.  If you are 18-39 years of age, have your blood pressure checked every 3-5 years. If you are 40 years of age or older, have your blood pressure checked every year. You should have your blood pressure measured twice--once when you are at a hospital or clinic, and once when you are not at a hospital or clinic. Record the average of the two measurements. To check your blood pressure when you are not at a hospital or clinic, you can use:  An automated blood pressure machine at a pharmacy.  A home blood pressure monitor.  If you are between 55 years and 79 years old, ask your health care provider if you should take aspirin to prevent strokes.  Have regular diabetes  screenings. This involves taking a blood sample to check your fasting blood sugar level.  If you are at a normal weight and have a low risk for diabetes, have this test once every three years after 58 years of age.  If you are overweight and have a high risk for diabetes, consider being tested at a younger age or more often. PREVENTING INFECTION  Hepatitis B  If you have a higher risk for hepatitis B, you should be screened for this virus. You are considered at high risk for hepatitis B if:  You were born in a country where hepatitis B is common. Ask your health care provider which countries are considered high risk.  Your parents were born in a high-risk country, and you have not been immunized against hepatitis B (hepatitis B vaccine).  You have HIV or AIDS.  You use needles to inject street drugs.  You live with someone who has hepatitis B.  You have had sex with someone who has hepatitis B.  You get hemodialysis treatment.  You take certain medicines for conditions, including cancer, organ transplantation, and autoimmune conditions. Hepatitis C  Blood testing is recommended for:  Everyone born from 1945 through 1965.  Anyone with known risk factors for hepatitis C. Sexually transmitted infections (STIs)  You should be screened for sexually transmitted infections (STIs) including gonorrhea and chlamydia if:  You are sexually active and are younger than 58 years of age.  You are older than 58 years of age and your health care provider tells you that you are at risk for this type of infection.  Your sexual activity has changed since you were last screened and you are at an increased risk for chlamydia or gonorrhea. Ask your health care provider if you are at risk.  If you do not have HIV, but are at risk, it may be recommended that you take a prescription medicine daily to prevent HIV infection. This is called pre-exposure prophylaxis (PrEP). You are considered at risk  if:  You are sexually active and do not regularly use condoms or know the HIV status of your partner(s).  You take drugs by injection.  You are sexually active with a partner who has HIV. Talk with your health care provider about whether you are at high risk of being infected with HIV. If you choose to begin PrEP, you should first be tested for HIV. You should then be tested every 3 months for as   long as you are taking PrEP.  PREGNANCY   If you are premenopausal and you may become pregnant, ask your health care provider about preconception counseling.  If you may become pregnant, take 400 to 800 micrograms (mcg) of folic acid every day.  If you want to prevent pregnancy, talk to your health care provider about birth control (contraception). OSTEOPOROSIS AND MENOPAUSE   Osteoporosis is a disease in which the bones lose minerals and strength with aging. This can result in serious bone fractures. Your risk for osteoporosis can be identified using a bone density scan.  If you are 30 years of age or older, or if you are at risk for osteoporosis and fractures, ask your health care provider if you should be screened.  Ask your health care provider whether you should take a calcium or vitamin D supplement to lower your risk for osteoporosis.  Menopause may have certain physical symptoms and risks.  Hormone replacement therapy may reduce some of these symptoms and risks. Talk to your health care provider about whether hormone replacement therapy is right for you.  HOME CARE INSTRUCTIONS   Schedule regular health, dental, and eye exams.  Stay current with your immunizations.   Do not use any tobacco products including cigarettes, chewing tobacco, or electronic cigarettes.  If you are pregnant, do not drink alcohol.  If you are breastfeeding, limit how much and how often you drink alcohol.  Limit alcohol intake to no more than 1 drink per day for nonpregnant women. One drink equals 12  ounces of beer, 5 ounces of wine, or 1 ounces of hard liquor.  Do not use street drugs.  Do not share needles.  Ask your health care provider for help if you need support or information about quitting drugs.  Tell your health care provider if you often feel depressed.  Tell your health care provider if you have ever been abused or do not feel safe at home.   This information is not intended to replace advice given to you by your health care provider. Make sure you discuss any questions you have with your health care provider.   Document Released: 03/02/2011 Document Revised: 09/07/2014 Document Reviewed: 07/19/2013 Elsevier Interactive Patient Education [employer].

## 2016-03-17 NOTE — Progress Notes (Signed)
BP 110/70 mmHg  Pulse 76  Temp(Src) 98.3 F (36.8 C) (Oral)  Ht 5\' 6"  (1.676 m)  Wt 140 lb 4 oz (63.617 kg)  BMI 22.65 kg/m2   CC: medicare wellness visit  Subjective:    Patient ID: Rachel Mcintosh, female    DOB: 1956/02/27, 60 y.o.   MRN: [id number]  HPI: Rachel Mcintosh is a 60 y.o. female presenting on 03/17/2016 for Annual Exam   Rare prn hydrocodone use for L leg stump pain, last filled #20 07/2015.  Hearing, vision screens passed No falls in last year Denies depression/anhedonia  Preventative: G3P2  Colonoscopy 2013 WNL [facility])  Mammogram Birads1 01/2016 Well woman exam 07/2012 [facility]; Dr. Christophe Louis). No paps 2/2 h/o hysterectomy. Ovaries remain. Wants to establish with local OBGYN Flu yearly Pneumovax 05/2012 Tetanus 2008? Advanced directive - does not have set up. Daughter would be HCPOA. Planning on setting this up. Packet provided today.  Seat belt use discussed Sunscreen use discussed. No changing moles.   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 Interim medical history since our last visit reviewed. Allergies and medications reviewed and updated. Current Outpatient Prescriptions on File Prior to Visit  Medication Sig  . Calcium Carbonate-Vitamin D3 (CALCIUM 600/VITAMIN D) 600-400 MG-UNIT TABS Take 1 tablet by mouth daily.  . pantoprazole (PROTONIX) 40 MG tablet TAKE ONE TABLET BY MOUTH ONCE DAILY  . hydrOXYzine (ATARAX/VISTARIL) 25 MG tablet Take 0.5-1 tablets (12.5-25 mg total) by mouth 2 (two) times daily as needed for anxiety. (Patient not taking: Reported on 03/17/2016)  . nitroGLYCERIN (NITROSTAT) 0.4 MG SL tablet Place 1 tablet (0.4 mg total) under the tongue every 5 (five) minutes as needed for chest pain. (Patient not taking: Reported on  03/17/2016)   No current facility-administered medications on file prior to visit.    Review of Systems Per HPI unless specifically indicated in ROS section     Objective:    BP 110/70 mmHg  Pulse 76  Temp(Src) 98.3 F (36.8 C) (Oral)  Ht 5\' 6"  (1.676 m)  Wt 140 lb 4 oz (63.617 kg)  BMI 22.65 kg/m2  Wt Readings from Last 3 Encounters:  03/17/16 140 lb 4 oz (63.617 kg)  02/06/16 142 lb 8 oz (64.638 kg)  12/23/15 143 lb (64.864 kg)    Physical Exam  Constitutional: She is oriented to person, place, and time. She appears well-developed and well-nourished. No distress.  HENT:  Head: Normocephalic and atraumatic.  Right Ear: Hearing, tympanic membrane, external ear and ear canal normal.  Left Ear: Hearing, tympanic membrane, external ear and ear canal normal.  Nose: Nose normal.  Mouth/Throat: Uvula is midline, oropharynx is clear and moist and mucous membranes are normal. No oropharyngeal exudate, posterior oropharyngeal edema or posterior oropharyngeal erythema.  Eyes: Conjunctivae and EOM are normal. Pupils are equal, round, and reactive to light. No scleral icterus.  Neck: Normal range of motion. Neck supple. Carotid bruit is not present. No thyromegaly present.  Cardiovascular: Normal rate, regular rhythm, normal heart sounds and intact distal pulses.   No murmur heard. Pulses:      Radial pulses are 2+ on the right side, and 2+ on the left side.  Pulmonary/Chest: Effort normal and breath sounds normal. No respiratory distress. She has no wheezes. She has no rales.  Abdominal: Soft. Bowel sounds are normal. She exhibits no distension and no mass. There is no tenderness. There is no rebound and no guarding.  Musculoskeletal: Normal range of motion. She exhibits no edema.  Lymphadenopathy:    She has no cervical adenopathy.  Neurological: She is alert and oriented to person, place, and time.  CN grossly intact, station and gait intact Recall 3/3  Calculation 4/5 serial 7s    Skin: Skin is warm and dry. No rash noted.  Psychiatric: She has a normal mood and affect. Her behavior is normal. Judgment and thought content normal.  Nursing note and vitals reviewed.  Results for orders placed or performed in visit on 03/11/16  Vitamin B12  Result Value Ref Range   Vitamin B-12 301 211 - 911 pg/mL  Lipid panel  Result Value Ref Range   Cholesterol 249 (H) 0 - 200 mg/dL   Triglycerides 88.0 0.0 - 149.0 mg/dL   HDL 60.70 >39.00 mg/dL   VLDL 17.6 0.0 - 40.0 mg/dL   LDL Cholesterol 171 (H) 0 - 99 mg/dL   Total CHOL/HDL Ratio 4    NonHDL 188.17   TSH  Result Value Ref Range   TSH 0.66 0.35 - 4.50 uIU/mL  Hepatitis C antibody  Result Value Ref Range   HCV Ab NEGATIVE NEGATIVE      Assessment & Plan:   Problem List Items Addressed This Visit    Abdominal aortic atherosclerosis (HCC)    Consider aspirin.       Above knee amputation of left lower extremity (HCC)    Prn rare hydrocodone refilled today.       Advanced care planning/counseling discussion    Advanced directive - does not have set up. Daughter would be HCPOA. Planning on setting this up. Packet provided today.       Hyperlipidemia    Reviewed recent cholesterol levels. ASCVD 10 yr risk = 2.1% Discussed increased legumes to improve LDL.       Medicare annual wellness visit, subsequent - Primary    I have personally reviewed the Medicare Annual Wellness questionnaire and have noted 1. The patient's medical and social history 2. Their use of alcohol, tobacco or illicit drugs 3. Their current medications and supplements 4. The patient's functional ability including ADL's, fall risks, home safety risks and hearing or visual impairment. Cognitive function has been assessed and addressed as indicated.  5. Diet and physical activity 6. Evidence for depression or mood disorders The patients weight, height, BMI have been recorded in the chart. I have made referrals, counseling and provided  education to the patient based on review of the above and I have provided the pt with a written personalized care plan for preventive services. Provider list updated.. See scanned questionairre as needed for further documentation. Reviewed preventative protocols and updated unless pt declined.       Osteopenia    Continue calcium/vit D daily.       Vitamin B12 deficiency    rec start 575mcg B12 OTC a few days a week.          Follow up plan: Return in about 1 year (around 03/17/2017), or as needed, for medicare wellness visit.  Ria Bush, MD

## 2016-04-13 ENCOUNTER — Telehealth: Payer: Self-pay | Admitting: Family Medicine

## 2016-04-13 NOTE — Telephone Encounter (Signed)
Patient Name: Rachel Mcintosh DOB: [birth year] Initial Comment back, neck and shoulder blade pain when she takes a deep breath she gets shortness of breath when she tries to exert herself, usually gets this about 2x year, no chest pains hot flashes, runny nose, cough Nurse Assessment Nurse: Mancel Bale, RN, Cayla Date/Time (Eastern Time): 04/13/2016 1:44:43 PM Confirm and document reason for call. If symptomatic, describe symptoms. You must click the next button to save text entered. ---Caller states that she is having back pain, neck pain, shoulder pain. The caller states that this happens twice a year. Caller states that she is having pain when she takes a deep breath. Caller states that she has a runny nose, and a cough. Has the patient traveled out of the country within the last 30 days? ---No Does the patient have any new or worsening symptoms? ---Yes Will a triage be completed? ---Yes Related visit to physician within the last 2 weeks? ---No Does the PT have any chronic conditions? (i.e. diabetes, asthma, etc.) ---Yes List chronic conditions. ---Amputee Is this a behavioral health or substance abuse call? ---No Guidelines Guideline Title Affirmed Question Affirmed Notes Chest Pain [1] Chest pain lasts > 5 minutes AND [2] age > 11 Final Disposition User Call EMS 911 Now Mancel Bale, RN, Cayla Comments caller refused to call 911. The nurse called the backline to inform the office that the caller was not going to be calling 911. The office stated that they would let the assistant know and she would be contacting the patient after they return from lunch. Disagree/Comply: Disagree Disagree/Comply Reason: Disagree with instructions

## 2016-04-13 NOTE — Telephone Encounter (Signed)
Looks like pt was told we would be in touch. plz call pt and offer next available appt.

## 2016-04-13 NOTE — Telephone Encounter (Signed)
See message.

## 2016-04-13 NOTE — Telephone Encounter (Signed)
Rachel Mcintosh,[facility],called to notify Dr.Gutierrez patient refuses to go to ER.  Rachel Mcintosh will be sending a detailed message.

## 2016-04-13 NOTE — Telephone Encounter (Signed)
Patient Name: Rachel Mcintosh  DOB: 03/10/1956    Initial Comment Caller states c/o back, neck and shoulder blade pain when she takes a deep breath, hot flashes, runny nose and cough.   Nurse Assessment      Guidelines    Guideline Title Affirmed Question Affirmed Notes       Final Disposition User   FINAL ATTEMPT MADE - no message left Stand[place], RN, [employer]

## 2016-04-14 ENCOUNTER — Ambulatory Visit (INDEPENDENT_AMBULATORY_CARE_PROVIDER_SITE_OTHER)
Admission: RE | Admit: 2016-04-14 | Discharge: 2016-04-14 | Disposition: A | Discharge status: Home / Self Care | Payer: Medicare Other | Source: Ambulatory Visit | Attending: Primary Care | Admitting: Primary Care

## 2016-04-14 ENCOUNTER — Ambulatory Visit (INDEPENDENT_AMBULATORY_CARE_PROVIDER_SITE_OTHER): Payer: Medicare Other | Admitting: Primary Care

## 2016-04-14 ENCOUNTER — Encounter: Payer: Self-pay | Admitting: Primary Care

## 2016-04-14 VITALS — BP 122/74 | HR 75 | Temp 98.1°F | Ht 66.0 in | Wt 140.4 lb

## 2016-04-14 DIAGNOSIS — R0602 Shortness of breath: Secondary | ICD-10-CM

## 2016-04-14 DIAGNOSIS — R05 Cough: Secondary | ICD-10-CM | POA: Diagnosis not present

## 2016-04-14 LAB — CBC WITH DIFFERENTIAL/PLATELET
BASOS PCT: 1.3 % (ref 0.0–3.0)
Basophils Absolute: 0.1 10*3/uL (ref 0.0–0.1)
EOS ABS: 0.1 10*3/uL (ref 0.0–0.7)
EOS PCT: 1.2 % (ref 0.0–5.0)
HEMATOCRIT: 42.2 % (ref 36.0–46.0)
HEMOGLOBIN: 14.3 g/dL (ref 12.0–15.0)
LYMPHS PCT: 34.8 % (ref 12.0–46.0)
Lymphs Abs: 2.3 10*3/uL (ref 0.7–4.0)
MCHC: 33.9 g/dL (ref 30.0–36.0)
MCV: 83.4 fl (ref 78.0–100.0)
MONO ABS: 0.3 10*3/uL (ref 0.1–1.0)
Monocytes Relative: 5 % (ref 3.0–12.0)
NEUTROS ABS: 3.7 10*3/uL (ref 1.4–7.7)
Neutrophils Relative %: 57.7 % (ref 43.0–77.0)
PLATELETS: 259 10*3/uL (ref 150.0–400.0)
RBC: 5.06 Mil/uL (ref 3.87–5.11)
RDW: 12.9 % (ref 11.5–15.5)
WBC: 6.5 10*3/uL (ref 4.0–10.5)

## 2016-04-14 MED ORDER — PREDNISONE 20 MG PO TABS: ORAL_TABLET | ORAL | 0 refills | Status: DC

## 2016-04-14 MED ORDER — ALBUTEROL SULFATE (2.5 MG/3ML) 0.083% IN NEBU
2.5000 mg | INHALATION_SOLUTION | Freq: Once | RESPIRATORY_TRACT | Status: AC
  Administered 2016-04-14: 2.5 mg via RESPIRATORY_TRACT

## 2016-04-14 MED ORDER — ALBUTEROL SULFATE HFA 108 (90 BASE) MCG/ACT IN AERS: 2.0000 | INHALATION_SPRAY | Freq: Four times a day (QID) | RESPIRATORY_TRACT | 0 refills | Status: DC | PRN

## 2016-04-14 MED ORDER — IPRATROPIUM BROMIDE 0.02 % IN SOLN
0.5000 mg | Freq: Once | RESPIRATORY_TRACT | Status: AC
  Administered 2016-04-14: 0.5 mg via RESPIRATORY_TRACT

## 2016-04-14 NOTE — Telephone Encounter (Signed)
Noted  

## 2016-04-14 NOTE — Patient Instructions (Addendum)
Complete lab work and xray prior to leaving today. I will notify you of your results once received.   Start Prednisone tablets for shortness of breath and wheezing. Take 2 tablets by mouth daily for 5 days.  Inhale 2 puffs into the lungs every 6 to 8 hours as needed for wheezing and/or shortness of breath.   Complete xray(s) and labs prior to leaving today. I will notify you of your results once received.  Please notify me if you develop persistent fevers of 101, start coughing up green mucous, notice increased fatigue or weakness, or feel worse after 1 week of onset of symptoms.   Increase consumption of water intake and rest.  It was a pleasure meeting you!

## 2016-04-14 NOTE — Telephone Encounter (Signed)
Pt has appt with Allie Bossier NP 04/14/16 at 11:30.
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
## 2016-04-14 NOTE — Progress Notes (Signed)
Pre visit review using our clinic review tool, if applicable. No additional management support is needed unless otherwise documented below in the visit note. 

## 2016-04-15 LAB — D-DIMER, QUANTITATIVE (NOT AT ARMC): D DIMER QUANT: 0.21 ug{FEU}/mL (ref <0.50)

## 2016-04-18 ENCOUNTER — Other Ambulatory Visit: Payer: Self-pay | Admitting: Primary Care

## 2016-04-18 DIAGNOSIS — R0602 Shortness of breath: Secondary | ICD-10-CM

## 2016-04-19 ENCOUNTER — Encounter: Payer: Self-pay | Admitting: Primary Care

## 2016-04-20 ENCOUNTER — Encounter: Payer: Self-pay | Admitting: Primary Care

## 2016-04-20 ENCOUNTER — Other Ambulatory Visit: Payer: Self-pay | Admitting: Primary Care

## 2016-04-20 DIAGNOSIS — J189 Pneumonia, unspecified organism: Secondary | ICD-10-CM

## 2016-04-20 MED ORDER — LEVOFLOXACIN 500 MG PO TABS: 500.0000 mg | ORAL_TABLET | Freq: Every day | ORAL | 0 refills | Status: DC

## 2016-04-20 NOTE — Telephone Encounter (Signed)
Message left for patient to return my call.  

## 2016-04-20 NOTE — Telephone Encounter (Signed)
As not fully better plz schedule f/u visit this week. Thanks.

## 2016-04-20 NOTE — Telephone Encounter (Signed)
Called and spoke to patient and was advised that she is doing some better, but feels that she needs a little more medication to get completely well. Patient scheduled appointment as instructed,

## 2016-04-22 ENCOUNTER — Ambulatory Visit (INDEPENDENT_AMBULATORY_CARE_PROVIDER_SITE_OTHER): Payer: Medicare Other | Admitting: Family Medicine

## 2016-04-22 NOTE — Progress Notes (Signed)
Pre visit review using our clinic review tool, if applicable. No additional management support is needed unless otherwise documented below in the visit note. 

## 2016-04-22 NOTE — Progress Notes (Signed)
abx has been sent in for patient. Pt was unaware. appt cancelled. Pt will return if abx doesn't fully resolve sxs. Lungs clear today, pulse ox 98% RA.

## 2016-08-01 ENCOUNTER — Other Ambulatory Visit: Payer: Self-pay | Admitting: Family Medicine

## 2016-09-04 ENCOUNTER — Ambulatory Visit (INDEPENDENT_AMBULATORY_CARE_PROVIDER_SITE_OTHER): Payer: Medicare HMO | Admitting: Family Medicine

## 2016-09-04 ENCOUNTER — Encounter: Payer: Self-pay | Admitting: Family Medicine

## 2016-09-04 VITALS — BP 116/72 | HR 76 | Temp 98.3°F | Ht 66.0 in | Wt 145.5 lb

## 2016-09-04 DIAGNOSIS — R300 Dysuria: Secondary | ICD-10-CM | POA: Diagnosis not present

## 2016-09-04 DIAGNOSIS — N3000 Acute cystitis without hematuria: Secondary | ICD-10-CM

## 2016-09-04 DIAGNOSIS — N3001 Acute cystitis with hematuria: Secondary | ICD-10-CM | POA: Insufficient documentation

## 2016-09-04 LAB — POC URINALSYSI DIPSTICK (AUTOMATED)
BILIRUBIN UA: NEGATIVE
GLUCOSE UA: NEGATIVE
Ketones, UA: NEGATIVE
NITRITE UA: NEGATIVE
Spec Grav, UA: 1.01
UROBILINOGEN UA: 0.2
pH, UA: 6

## 2016-09-04 MED ORDER — PHENAZOPYRIDINE HCL 200 MG PO TABS: 200.0000 mg | ORAL_TABLET | Freq: Three times a day (TID) | ORAL | 0 refills | Status: DC | PRN

## 2016-09-04 MED ORDER — CIPROFLOXACIN HCL 250 MG PO TABS: 250.0000 mg | ORAL_TABLET | Freq: Two times a day (BID) | ORAL | 0 refills | Status: DC

## 2016-09-04 NOTE — Assessment & Plan Note (Signed)
Uncomplicated Cover with cipro (sulfa allergic) Fluids Disc symptomatic care - see instructions on AVS -pyridium px  Handout given  ucx pending  Update if no improvement in several days

## 2016-09-04 NOTE — Patient Instructions (Addendum)
You have a uti  Drink lots of water  Take the cipro as directed Pyridium as needed for urinary pain  Both with food   Update if not starting to improve in several days  or if worsening   Urinary Tract Infection, Adult Introduction A urinary tract infection (UTI) is an infection of any part of the urinary tract. The urinary tract includes the:  Kidneys.  Ureters.  Bladder.  Urethra. These organs make, store, and get rid of pee (urine) in the body. Follow these instructions at home:  Take over-the-counter and prescription medicines only as told by your doctor.  If you were prescribed an antibiotic medicine, take it as told by your doctor. Do not stop taking the antibiotic even if you start to feel better.  Avoid the following drinks:  Alcohol.  Caffeine.  Tea.  Carbonated drinks.  Drink enough fluid to keep your pee clear or pale yellow.  Keep all follow-up visits as told by your doctor. This is important.  Make sure to:  Empty your bladder often and completely. Do not to hold pee for long periods of time.  Empty your bladder before and after sex.  Wipe from front to back after a bowel movement if you are female. Use each tissue one time when you wipe. Contact a doctor if:  You have back pain.  You have a fever.  You feel sick to your stomach (nauseous).  You throw up (vomit).  Your symptoms do not get better after 3 days.  Your symptoms go away and then come back. Get help right away if:  You have very bad back pain.  You have very bad lower belly (abdominal) pain.  You are throwing up and cannot keep down any medicines or water. This information is not intended to replace advice given to you by your health care provider. Make sure you discuss any questions you have with your health care provider. Document Released: 02/03/2008 Document Revised: 01/23/2016 Document Reviewed: 07/08/2015  2017 [employer]

## 2016-09-04 NOTE — Progress Notes (Signed)
Pre visit review using our clinic review tool, if applicable. No additional management support is needed unless otherwise documented below in the visit note. 
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
## 2016-09-07 LAB — URINE CULTURE

## 2016-09-23 ENCOUNTER — Encounter: Payer: Self-pay | Admitting: Primary Care

## 2016-09-23 ENCOUNTER — Ambulatory Visit (INDEPENDENT_AMBULATORY_CARE_PROVIDER_SITE_OTHER): Payer: Medicare HMO | Admitting: Primary Care

## 2016-09-23 VITALS — BP 122/82 | HR 75 | Temp 98.4°F | Ht 66.0 in | Wt 145.0 lb

## 2016-09-23 DIAGNOSIS — R300 Dysuria: Secondary | ICD-10-CM

## 2016-09-23 DIAGNOSIS — M545 Low back pain, unspecified: Secondary | ICD-10-CM

## 2016-09-23 DIAGNOSIS — Z23 Encounter for immunization: Secondary | ICD-10-CM | POA: Diagnosis not present

## 2016-09-23 LAB — POC URINALSYSI DIPSTICK (AUTOMATED)
BILIRUBIN UA: NEGATIVE
Glucose, UA: NEGATIVE
KETONES UA: NEGATIVE
Leukocytes, UA: NEGATIVE
PH UA: 6.5
Protein, UA: NEGATIVE
RBC UA: NEGATIVE
SPEC GRAV UA: 1.01
Urobilinogen, UA: NEGATIVE
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
## 2016-09-23 NOTE — Progress Notes (Signed)
Pre visit review using our clinic review tool, if applicable. No additional management support is needed unless otherwise documented below in the visit note. 

## 2016-09-23 NOTE — Patient Instructions (Addendum)
Your urine does not show evidence for infection.  The bladder can spasm sometimes after an infection.   Please ensure you stay hydrated with water. Please notify us if your symptoms progress, you develop fevers/nausea/abdominal pain.  It was a pleasure to see you today!

## 2016-09-23 NOTE — Addendum Note (Signed)
Addended by: Jacqualin Combes on: 09/23/2016 12:31 PM   Modules accepted: Orders

## 2016-12-02 ENCOUNTER — Ambulatory Visit (INDEPENDENT_AMBULATORY_CARE_PROVIDER_SITE_OTHER): Payer: Medicare HMO | Admitting: Orthopedic Surgery

## 2016-12-02 DIAGNOSIS — Z89612 Acquired absence of left leg above knee: Secondary | ICD-10-CM

## 2016-12-02 DIAGNOSIS — S78112A Complete traumatic amputation at level between left hip and knee, initial encounter: Secondary | ICD-10-CM
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
## 2016-12-04 DIAGNOSIS — H524 Presbyopia: Secondary | ICD-10-CM | POA: Diagnosis not present

## 2016-12-04 DIAGNOSIS — Z0100 Encounter for examination of eyes and vision without abnormal findings: Secondary | ICD-10-CM | POA: Diagnosis not present

## 2016-12-14 ENCOUNTER — Other Ambulatory Visit: Payer: Self-pay | Admitting: Family Medicine

## 2016-12-15 NOTE — Telephone Encounter (Signed)
Ok to refill? Last filled 02/06/16 #[id number]

## 2016-12-22 DIAGNOSIS — Z0100 Encounter for examination of eyes and vision without abnormal findings: Secondary | ICD-10-CM | POA: Diagnosis not present

## 2016-12-22 DIAGNOSIS — H52209 Unspecified astigmatism, unspecified eye: Secondary | ICD-10-CM | POA: Diagnosis not present

## 2016-12-22 DIAGNOSIS — H5213 Myopia, bilateral: Secondary | ICD-10-CM | POA: Diagnosis not present

## 2016-12-28 ENCOUNTER — Encounter: Payer: Self-pay | Admitting: Family Medicine

## 2016-12-28 ENCOUNTER — Ambulatory Visit (INDEPENDENT_AMBULATORY_CARE_PROVIDER_SITE_OTHER): Payer: Medicare HMO | Admitting: Family Medicine

## 2016-12-28 VITALS — BP 116/74 | HR 86 | Temp 98.4°F | Wt 143.5 lb

## 2016-12-28 DIAGNOSIS — J209 Acute bronchitis, unspecified: Secondary | ICD-10-CM

## 2016-12-28 MED ORDER — DOXYCYCLINE HYCLATE 100 MG PO CAPS: 100.0000 mg | ORAL_CAPSULE | Freq: Two times a day (BID) | ORAL | 0 refills | Status: DC

## 2016-12-28 NOTE — Progress Notes (Signed)
BP 116/74   Pulse 86   Temp 98.4 F (36.9 C) (Oral)   Wt 143 lb 8 oz (65.1 kg)   SpO2 98%   BMI 23.16 kg/m    CC: cough Subjective:    Patient ID: Rachel Mcintosh, female    DOB: 1955-11-23, 61 y.o.   MRN: [id number]  HPI: Rachel Mcintosh is a 61 y.o. female presenting on 12/28/2016 for Cough and congestion in chest   2-3d h/o chest and nasal congestion, mildly productive cough, mild dyspnea. Trouble sleeping over weekend due to cough. Some dyspnea, orthopnea and PNdrainage. ++ GERD and substernal chest pressure despite protonix 40mg  daily.   Hasn't tried anything for this.   No fevers/chills, ear or tooth pain, HA, ST.   Father with PNA recently in and out of hospital.  No h/o asthma Ex smoker - quit 08/2015. States zpack has not been effective in the past   Relevant past medical, surgical, family and social history reviewed and updated as indicated. Interim medical history since our last visit reviewed. Allergies and medications reviewed and updated. Outpatient Medications Prior to Visit  Medication Sig Dispense Refill  . Calcium Carbonate-Vitamin D3 (CALCIUM 600/VITAMIN D) 600-400 MG-UNIT TABS Take 1 tablet by mouth daily.    . cyanocobalamin (V-R VITAMIN B-12) 500 MCG tablet Take 1 tablet (500 mcg total) by mouth daily. (Patient taking differently: Take 500 mcg by mouth 3 (three) times a week. )    . HYDROcodone-acetaminophen (NORCO/VICODIN) 5-325 MG tablet Take 1 tablet by mouth every 6 (six) hours as needed for moderate pain (rare use). 20 tablet 0  . hydrOXYzine (ATARAX/VISTARIL) 25 MG tablet TAKE ONE-HALF TO ONE TABLET BY MOUTH TWICE DAILY AS NEEDED FOR ANXIETY 30 tablet 0  . pantoprazole (PROTONIX) 40 MG tablet TAKE ONE TABLET BY MOUTH ONCE DAILY 90 tablet 2   No facility-administered medications prior to visit.      Per HPI unless specifically indicated in ROS section below Review of Systems     Objective:    BP 116/74   Pulse 86   Temp 98.4 F (36.9 C)  (Oral)   Wt 143 lb 8 oz (65.1 kg)   SpO2 98%   BMI 23.16 kg/m   Wt Readings from Last 3 Encounters:  12/28/16 143 lb 8 oz (65.1 kg)  09/23/16 145 lb (65.8 kg)  09/04/16 145 lb 8 oz (66 kg)    Physical Exam  Constitutional: She appears well-developed and well-nourished. No distress.  HENT:  Head: Normocephalic and atraumatic.  Right Ear: Hearing, tympanic membrane, external ear and ear canal normal.  Left Ear: Hearing, tympanic membrane, external ear and ear canal normal.  Nose: Mucosal edema (nasal mucosal congestion) present. No rhinorrhea. Right sinus exhibits no maxillary sinus tenderness and no frontal sinus tenderness. Left sinus exhibits no maxillary sinus tenderness and no frontal sinus tenderness.  Mouth/Throat: Uvula is midline, oropharynx is clear and moist and mucous membranes are normal. No oropharyngeal exudate, posterior oropharyngeal edema, posterior oropharyngeal erythema or tonsillar abscesses.  Eyes: Conjunctivae and EOM are normal. Pupils are equal, round, and reactive to light. No scleral icterus.  Neck: Normal range of motion. Neck supple.  Cardiovascular: Normal rate, regular rhythm, normal heart sounds and intact distal pulses.   No murmur heard. Pulmonary/Chest: Effort normal and breath sounds normal. No respiratory distress. She has no wheezes. She has no rales.  Lymphadenopathy:    She has no cervical adenopathy.  Skin: Skin is warm and dry. No rash  noted.  Nursing note and vitals reviewed.     Assessment & Plan:   Problem List Items Addressed This Visit    Acute bronchitis - Primary    Ex smoker Recurrent flares 1-2 x/year.  Some concerning chest pressure however pt states this feels just like her prior bronchitis episodes.  States zpack not previously effective. Will treat with doxy 10d course, discussed supportive care. Discussed spirometry to eval for COPD/chronic bronchitis when feeling better.           Follow up plan: Return if symptoms  worsen or fail to improve.  Ria Bush, MD

## 2016-12-28 NOTE — Assessment & Plan Note (Signed)
Ex smoker Recurrent flares 1-2 x/year.  Some concerning chest pressure however pt states this feels just like her prior bronchitis episodes.  States zpack not previously effective. Will treat with doxy 10d course, discussed supportive care. Discussed spirometry to eval for COPD/chronic bronchitis when feeling better.

## 2016-12-28 NOTE — Patient Instructions (Addendum)
We will treat possible bronchitis with doxycycline capsule antibiotic.  Push fluids and rest.  Let us know if not improving with treatment.  Consider spirometry lung function test when you're feeling better.  Continue protonix 40mg  daily.

## 2016-12-28 NOTE — Progress Notes (Signed)
Pre visit review using our clinic review tool, if applicable. No additional management support is needed unless otherwise documented below in the visit note. 

## 2016-12-29 ENCOUNTER — Encounter: Payer: Self-pay | Admitting: Family Medicine

## 2016-12-30 MED ORDER — CEFDINIR 300 MG PO CAPS: 300.0000 mg | ORAL_CAPSULE | Freq: Two times a day (BID) | ORAL | 0 refills | Status: DC

## 2017-01-04 ENCOUNTER — Telehealth: Payer: Self-pay | Admitting: *Deleted

## 2017-01-04 NOTE — Telephone Encounter (Signed)
Rachel Mcintosh notified as instructed by telephone.  She states after calling in today, she actually started feeling better so she thinks the antibiotics are kicking in.  She states she wants to go ahead and finish the antibiotics,hich she has 3 more days of, and if not continuing to improve, she will call us back by the end of the week.

## 2017-01-04 NOTE — Telephone Encounter (Signed)
Spoke to pt who states she was given a new abx on Friday, after she was unable to take doxycycline. Pt has four days left of abx, but states she has not felt any improvement so far. She is now becoming winded, even with taking prednisone, and is wanting to know how to proceed. pls advise

## 2017-01-04 NOTE — Telephone Encounter (Signed)
Recommend return for office visit re evaluation. New abx was cefdinir.

## 2017-01-07 ENCOUNTER — Telehealth (INDEPENDENT_AMBULATORY_CARE_PROVIDER_SITE_OTHER): Payer: Self-pay | Admitting: Orthopedic Surgery

## 2017-01-07 NOTE — Telephone Encounter (Signed)
12/02/2016 OV NOTE FAXED TO [facility] [phone]

## 2017-01-11 ENCOUNTER — Encounter: Payer: Self-pay | Admitting: Family Medicine

## 2017-01-11 ENCOUNTER — Ambulatory Visit: Payer: Medicare HMO | Admitting: Family Medicine

## 2017-01-11 ENCOUNTER — Ambulatory Visit (INDEPENDENT_AMBULATORY_CARE_PROVIDER_SITE_OTHER): Payer: Medicare HMO | Admitting: Family Medicine

## 2017-01-11 DIAGNOSIS — J441 Chronic obstructive pulmonary disease with (acute) exacerbation: Secondary | ICD-10-CM

## 2017-01-11 MED ORDER — LEVOFLOXACIN 500 MG PO TABS: 500.0000 mg | ORAL_TABLET | Freq: Every day | ORAL | 0 refills | Status: DC

## 2017-01-11 NOTE — Progress Notes (Addendum)
   Subjective:    Patient ID: Rachel Mcintosh, female    DOB: 1956/01/20, 61 y.o.   MRN: [id number]  Shortness of Breath  Pertinent negatives include no abdominal pain, chest pain, ear pain, fever or leg swelling.  Cough  Associated symptoms include shortness of breath. Pertinent negatives include no chest pain, ear pain or fever.   She was  Seen by PCP on 4/30.. Treated for COPD exacerbation. Had SE of doxycycline.. Was only able to take 4 days of this.  Continue symptoms.  Changed to  Cefdinir.  Felt better in last 3 days, less congestion.. Symptoms returned yesterday.  Continue cough, productive cough ( thick), headache fronal,  Nasal congestion,Cough keeping her up at night. No fever. Occ sweats, feeling hot. She is winded, SOB, no wheeze.  She is not currently on albuterol.. Felt it did not help much.  She feels like body is shaky. No decongestant. Drinking water, but less yesterday. BP Readings from Last 3 Encounters:  01/11/17 (!) 151/85  12/28/16 116/74  09/23/16 122/82      Father with PNA recently in and out of hospital.  No h/o asthma Ex smoker - quit 08/2015. States zpack has not been effective in the past   Review of Systems  Constitutional: Positive for fatigue. Negative for fever.  HENT: Negative for ear pain.   Eyes: Negative for pain.  Respiratory: Positive for cough and shortness of breath. Negative for chest tightness.   Cardiovascular: Negative for chest pain, palpitations and leg swelling.  Gastrointestinal: Negative for abdominal pain.  Genitourinary: Negative for dysuria.       Objective:   Physical Exam  Constitutional: Vital signs are normal. She appears well-developed and well-nourished. She is cooperative.  Non-toxic appearance. She does not appear ill. No distress.  HENT:  Head: Normocephalic.  Right Ear: Hearing, tympanic membrane, external ear and ear canal normal. Tympanic membrane is not erythematous, not retracted and not bulging.    Left Ear: Hearing, tympanic membrane, external ear and ear canal normal. Tympanic membrane is not erythematous, not retracted and not bulging.  Nose: Mucosal edema and rhinorrhea present. Right sinus exhibits no maxillary sinus tenderness and no frontal sinus tenderness. Left sinus exhibits no maxillary sinus tenderness and no frontal sinus tenderness.  Mouth/Throat: Uvula is midline, oropharynx is clear and moist and mucous membranes are normal.  Eyes: Conjunctivae, EOM and lids are normal. Pupils are equal, round, and reactive to light. Lids are everted and swept, no foreign bodies found.  Neck: Trachea normal and normal range of motion. Neck supple. Carotid bruit is not present. No thyroid mass and no thyromegaly present.  Cardiovascular: Normal rate, regular rhythm, S1 normal, S2 normal, normal heart sounds, intact distal pulses and normal pulses.  Exam reveals no gallop and no friction rub.   No murmur heard. Pulmonary/Chest: Effort normal and breath sounds normal. No tachypnea. No respiratory distress. She has no decreased breath sounds. She has no wheezes. She has no rhonchi. She has no rales.   Frequent cough  Neurological: She is alert.  Skin: Skin is warm, dry and intact. No rash noted.  Psychiatric: Her speech is normal and behavior is normal. Judgment normal. Her mood appears not anxious. Cognition and memory are normal. She does not exhibit a depressed mood.          Assessment & Plan:

## 2017-01-11 NOTE — Assessment & Plan Note (Signed)
No wheeze on exam.. No clear indication for pred taper. Continued productive cough in pt with COPD despite course of antibiotics. Will broaden coverage to levaquin for 7 days.

## 2017-01-11 NOTE — Patient Instructions (Addendum)
Follow blood pressure at home. Complete  antibiotics.

## 2017-01-14 DIAGNOSIS — R69 Illness, unspecified: Secondary | ICD-10-CM | POA: Diagnosis not present

## 2017-01-28 ENCOUNTER — Ambulatory Visit (INDEPENDENT_AMBULATORY_CARE_PROVIDER_SITE_OTHER): Payer: Medicare HMO | Admitting: Internal Medicine

## 2017-01-28 ENCOUNTER — Encounter: Payer: Self-pay | Admitting: Internal Medicine

## 2017-01-28 VITALS — BP 128/88 | HR 78 | Temp 98.1°F | Wt 139.0 lb

## 2017-01-28 DIAGNOSIS — J301 Allergic rhinitis due to pollen: Secondary | ICD-10-CM | POA: Diagnosis not present

## 2017-01-28 NOTE — Patient Instructions (Signed)
Allergic Rhinitis Allergic rhinitis is when the mucous membranes in the nose respond to allergens. Allergens are particles in the air that cause your body to have an allergic reaction. This causes you to release allergic antibodies. Through a chain of events, these eventually cause you to release histamine into the blood stream. Although meant to protect the body, it is this release of histamine that causes your discomfort, such as frequent sneezing, congestion, and an itchy, runny nose. What are the causes? Seasonal allergic rhinitis (hay fever) is caused by pollen allergens that may come from grasses, trees, and weeds. Year-round allergic rhinitis (perennial allergic rhinitis) is caused by allergens such as house dust mites, pet dander, and mold spores. What are the signs or symptoms?  Nasal stuffiness (congestion).  Itchy, runny nose with sneezing and tearing of the eyes. How is this diagnosed? Your health care provider can help you determine the allergen or allergens that trigger your symptoms. If you and your health care provider are unable to determine the allergen, skin or blood testing may be used. Your health care provider will diagnose your condition after taking your health history and performing a physical exam. Your health care provider may assess you for other related conditions, such as asthma, pink eye, or an ear infection. How is this treated? Allergic rhinitis does not have a cure, but it can be controlled by:  Medicines that block allergy symptoms. These may include allergy shots, nasal sprays, and oral antihistamines.  Avoiding the allergen. Hay fever may often be treated with antihistamines in pill or nasal spray forms. Antihistamines block the effects of histamine. There are over-the-counter medicines that may help with nasal congestion and swelling around the eyes. Check with your health care provider before taking or giving this medicine. If avoiding the allergen or the  medicine prescribed do not work, there are many new medicines your health care provider can prescribe. Stronger medicine may be used if initial measures are ineffective. Desensitizing injections can be used if medicine and avoidance does not work. Desensitization is when a patient is given ongoing shots until the body becomes less sensitive to the allergen. Make sure you follow up with your health care provider if problems continue. Follow these instructions at home: It is not possible to completely avoid allergens, but you can reduce your symptoms by taking steps to limit your exposure to them. It helps to know exactly what you are allergic to so that you can avoid your specific triggers. Contact a health care provider if:  You have a fever.  You develop a cough that does not stop easily (persistent).  You have shortness of breath.  You start wheezing.  Symptoms interfere with normal daily activities. This information is not intended to replace advice given to you by your health care provider. Make sure you discuss any questions you have with your health care provider. Document Released: 05/12/2001 Document Revised: 04/17/2016 Document Reviewed: 04/24/2013 Elsevier Interactive Patient Education  2017 Elsevier Inc.  

## 2017-01-28 NOTE — Progress Notes (Signed)
Subjective:    Patient ID: Rachel Mcintosh, female    DOB: 10/15/1955, 61 y.o.   MRN: [id number]  HPI  Pt presents to the clinic today with c/o nasal congestion, cough and shortness of breath. She reports this started 2 days ago. She is blowing clear mucous out of her nose. The cough is non productive. She is mildly short of breath. She denies fever, chills or body aches. She has tried Claritin OTC with minimal relief. She was recently treated for a COPD exacerbation 2 weeks ago with Levaquin. She has not history of allergies. She has not had sick contacts.  Review of Systems      Past Medical History:  Diagnosis Date  . Abdominal aortic atherosclerosis ([place]) 01/2016   by xray  . Above knee amputation of left lower extremity ([place]) 2008   disability since then  . CAP (community acquired pneumonia) 07/27/2015  . Cervical spine fracture ([place]) 2008   hx of   . DDD (degenerative disc disease), lumbar 2007   s/p surgery  . Ex-smoker   . GERD (gastroesophageal reflux disease)    doing well off meds  . History of anxiety    situational/stress induced, previously on paxil  . Hyperlipidemia    controlled by diet  . Osteopenia 01/2016   by xray  . Presence of IVC filter 01/2016   by xray  . Renal cysts, acquired, bilateral 2008   by CT rec renal US  . Urine incontinence    "difficulty holding urine"    Current Outpatient Prescriptions  Medication Sig Dispense Refill  . Calcium Carbonate-Vitamin D3 (CALCIUM 600/VITAMIN D) 600-400 MG-UNIT TABS Take 1 tablet by mouth daily.    . cyanocobalamin (V-R VITAMIN B-12) 500 MCG tablet Take 1 tablet (500 mcg total) by mouth daily. (Patient taking differently: Take 500 mcg by mouth 3 (three) times a week. )    . HYDROcodone-acetaminophen (NORCO/VICODIN) 5-325 MG tablet Take 1 tablet by mouth every 6 (six) hours as needed for moderate pain (rare use). 20 tablet 0  . hydrOXYzine (ATARAX/VISTARIL) 25 MG tablet TAKE ONE-HALF TO ONE TABLET BY MOUTH  TWICE DAILY AS NEEDED FOR ANXIETY 30 tablet 0  . levofloxacin (LEVAQUIN) 500 MG tablet Take 1 tablet (500 mg total) by mouth daily. 7 tablet 0  . pantoprazole (PROTONIX) 40 MG tablet TAKE ONE TABLET BY MOUTH ONCE DAILY 90 tablet 2   No current facility-administered medications for this visit.     Allergies  Allergen Reactions  . Codeine Nausea And Vomiting  . Doxycycline Other (See Comments)    Worsened GERD  . Sulfa Antibiotics Other (See Comments)    Unsure of allergy    Family History  Problem Relation Age of Onset  . Hyperlipidemia Mother   . Hyperlipidemia Father   . Prostate cancer Paternal Grandfather   . Cancer Other        breast (great grandmother)  . CAD Maternal Uncle 40       MI  . Breast cancer Maternal Aunt   . Stroke Neg Hx   . Diabetes Neg Hx     Social History   Social History  . Marital status: Single    Spouse name: N/A  . Number of children: 2  . Years of education: N/A   Occupational History  . Not on file.   Social History Main Topics  . Smoking status: Former Smoker    Packs/day: 0.50    Years: 35.00    Types:  Cigarettes    Quit date: 08/01/2015  . Smokeless tobacco: Never Used     Comment: pt declined  . Alcohol use 0.0 oz/week     Comment: rare  . Drug use: No  . Sexual activity: Not on file   Other Topics Concern  . Not on file   Social History Narrative   Lives alone   Edu: HS   Occupation: disability since [place] 2008, was supervisor at [employer]   Activity: no regular exercise   Diet: some water, fruits daily     Constitutional: Denies fever, malaise, fatigue, headache or abrupt weight changes.  HEENT: Pt reports nasal congestion. Denies eye pain, eye redness, ear pain, ringing in the ears, wax buildup, runny nose, bloody nose, or sore throat. Respiratory: Pt reports cough and shortness of breath. Denies difficulty breathing, or sputum production.     No other specific complaints in a complete review of systems  (except as listed in HPI above).  Objective:   Physical Exam   BP 128/88   Pulse 78   Temp 98.1 F (36.7 C) (Oral)   Wt 139 lb (63 kg)   SpO2 98%   BMI 22.44 kg/m  Wt Readings from Last 3 Encounters:  01/28/17 139 lb (63 kg)  01/11/17 144 lb (65.3 kg)  12/28/16 143 lb 8 oz (65.1 kg)    General: Appears her stated age, in NAD. HEENT: Head: normal shape and size, no sinus tenderness noted;  Ears: Tm's gray and intact, normal light reflex; Nose: mucosa pink and moist, septum midline; Throat/Mouth: Teeth present, mucosa pink and moist, + PND, no exudate, lesions or ulcerations noted.  Neck:  No adenopathy noted. Pulmonary/Chest: Normal effort and positive vesicular breath sounds. No respiratory distress. No wheezes, rales or ronchi noted.   BMET    Component Value Date/Time   NA 141 12/23/2015 1510   K 3.7 12/23/2015 1510   CL 102 12/23/2015 1510   CO2 30 12/23/2015 1510   GLUCOSE 99 12/23/2015 1510   BUN 6 12/23/2015 1510   CREATININE 0.63 12/23/2015 1510   CALCIUM 9.8 12/23/2015 1510   GFRNONAA >60 07/30/2015 0300   GFRAA >60 07/30/2015 0300    Lipid Panel     Component Value Date/Time   CHOL 249 (H) 03/11/2016 0908   TRIG 88.0 03/11/2016 0908   HDL 60.70 03/11/2016 0908   CHOLHDL 4 03/11/2016 0908   VLDL 17.6 03/11/2016 0908   LDLCALC 171 (H) 03/11/2016 0908    CBC    Component Value Date/Time   WBC 6.5 04/14/2016 1236   RBC 5.06 04/14/2016 1236   HGB 14.3 04/14/2016 1236   HCT 42.2 04/14/2016 1236   PLT 259.0 04/14/2016 1236   MCV 83.4 04/14/2016 1236   MCH 28.8 07/30/2015 0300   MCHC 33.9 04/14/2016 1236   RDW 12.9 04/14/2016 1236   LYMPHSABS 2.3 04/14/2016 1236   MONOABS 0.3 04/14/2016 1236   EOSABS 0.1 04/14/2016 1236   BASOSABS 0.1 04/14/2016 1236    Hgb A1C No results found for: HGBA1C      Assessment & Plan:   Allergic Rhinitis:  Advised her to stop Claritin and start Zyrtec and Flonase OTC  Return precautions discussed Webb Silversmith, NP

## 2017-02-01 ENCOUNTER — Encounter: Payer: Self-pay | Admitting: Internal Medicine

## 2017-02-02 MED ORDER — PREDNISONE 10 MG PO TABS: ORAL_TABLET | ORAL | 0 refills | Status: DC

## 2017-02-15 ENCOUNTER — Encounter: Payer: Self-pay | Admitting: Internal Medicine

## 2017-02-16 ENCOUNTER — Ambulatory Visit (INDEPENDENT_AMBULATORY_CARE_PROVIDER_SITE_OTHER): Payer: Medicare HMO | Admitting: Family Medicine

## 2017-02-16 ENCOUNTER — Encounter: Payer: Self-pay | Admitting: Family Medicine

## 2017-02-16 ENCOUNTER — Ambulatory Visit (INDEPENDENT_AMBULATORY_CARE_PROVIDER_SITE_OTHER)
Admission: RE | Admit: 2017-02-16 | Discharge: 2017-02-16 | Disposition: A | Discharge status: Home / Self Care | Payer: Medicare HMO | Source: Ambulatory Visit | Attending: Family Medicine | Admitting: Family Medicine

## 2017-02-16 VITALS — BP 148/84 | HR 84 | Temp 98.6°F | Wt 135.2 lb

## 2017-02-16 DIAGNOSIS — R059 Cough, unspecified: Secondary | ICD-10-CM

## 2017-02-16 DIAGNOSIS — R05 Cough: Secondary | ICD-10-CM

## 2017-02-16 DIAGNOSIS — J441 Chronic obstructive pulmonary disease with (acute) exacerbation: Secondary | ICD-10-CM | POA: Diagnosis not present

## 2017-02-16 MED ORDER — PREDNISONE 20 MG PO TABS: ORAL_TABLET | ORAL | 0 refills | Status: DC

## 2017-02-16 NOTE — Patient Instructions (Signed)
Go to the lab on the way out.  We'll contact you with your xray report. Restart prednisone with food.  Recheck with Dr. Darnell Level in about 2 weeks.   You may need pulmonary testing at that point.  Take care.  Glad to see you.  If worse in the meantime then update me.

## 2017-02-16 NOTE — Progress Notes (Signed)
IVC filter hx d/w pt.  still in place based on previous imaging, d/w pt about placement and rationale for use.  She wasn't getting better prev, was changed from doxy to levaquin.  She got some better in the meantime at that point.  She got back to baseline for about 1.5 weeks.  Then got sick again, started on flonase and zyrtec.  No change in "nervousness in the chest and heavy breathing."   Former smoker, for years.  Quit a few years ago.  Previous changes suggestive of COPD noted on imaging, but no formal pulmonary seen on chart review. D/w pt.    Some cough and wheeze occ.  Some sputum, clear.  No fevers.  Some sinus pressure.  No ear pain.  No vomiting, no diarrhea.    flonase and zyrtec helped some with upper airway discharge.   She got a little better with prednisone, done with rx now.    Meds, vitals, and allergies reviewed.   ROS: Per HPI unless specifically indicated in ROS section   GEN: nad, alert and oriented HEENT: mucous membranes moist, tm w/o erythema, nasal exam w/o erythema, clear discharge noted,  OP with cobblestoning NECK: supple w/o LA CV: rrr.   PULM: ctab, no inc wob EXT: no edema SKIN: no acute rash

## 2017-02-17 NOTE — Assessment & Plan Note (Signed)
Presumed. Diagnosis and workup discussed with patient. Check chest x-ray today. Restart prednisone with food.  Recheck with PCP in about 2 weeks.   She may need pulmonary testing at that point.  It could be that she was getting better from her previous illness but recent weather change, increased heat and humidity, allergen exposure has contributed to recent symptoms. D/w pt.  >25 minutes spent in face to face time with patient, >50% spent in counselling or coordination of care.

## 2017-03-29 ENCOUNTER — Other Ambulatory Visit: Payer: Self-pay | Admitting: Family Medicine

## 2017-03-29 DIAGNOSIS — Z1231 Encounter for screening mammogram for malignant neoplasm of breast: Secondary | ICD-10-CM

## 2017-04-01 DIAGNOSIS — Z89612 Acquired absence of left leg above knee: Secondary | ICD-10-CM | POA: Diagnosis not present

## 2017-04-05 ENCOUNTER — Ambulatory Visit
Admission: RE | Admit: 2017-04-05 | Discharge: 2017-04-05 | Disposition: A | Discharge status: Home / Self Care | Payer: Medicare HMO | Source: Ambulatory Visit | Attending: Family Medicine | Admitting: Family Medicine

## 2017-04-05 DIAGNOSIS — Z1231 Encounter for screening mammogram for malignant neoplasm of breast: Secondary | ICD-10-CM | POA: Diagnosis not present

## 2017-04-06 ENCOUNTER — Encounter: Payer: Self-pay | Admitting: Family Medicine

## 2017-04-06 LAB — HM MAMMOGRAPHY

## 2017-04-13 ENCOUNTER — Other Ambulatory Visit: Payer: Self-pay | Admitting: Family Medicine

## 2017-04-13 DIAGNOSIS — E538 Deficiency of other specified B group vitamins: Secondary | ICD-10-CM

## 2017-04-13 DIAGNOSIS — E785 Hyperlipidemia, unspecified: Secondary | ICD-10-CM

## 2017-04-14 ENCOUNTER — Ambulatory Visit (INDEPENDENT_AMBULATORY_CARE_PROVIDER_SITE_OTHER): Payer: Medicare HMO

## 2017-04-14 VITALS — BP 122/70 | HR 71 | Temp 98.0°F | Ht 65.25 in | Wt 144.5 lb

## 2017-04-14 DIAGNOSIS — Z114 Encounter for screening for human immunodeficiency virus [HIV]: Secondary | ICD-10-CM | POA: Diagnosis not present

## 2017-04-14 DIAGNOSIS — E538 Deficiency of other specified B group vitamins: Secondary | ICD-10-CM

## 2017-04-14 DIAGNOSIS — Z0000 Encounter for general adult medical examination without abnormal findings: Secondary | ICD-10-CM | POA: Diagnosis not present

## 2017-04-14 DIAGNOSIS — E785 Hyperlipidemia, unspecified: Secondary | ICD-10-CM | POA: Diagnosis not present

## 2017-04-14 DIAGNOSIS — R69 Illness, unspecified: Secondary | ICD-10-CM | POA: Diagnosis not present

## 2017-04-14 LAB — BASIC METABOLIC PANEL
BUN: 8 mg/dL (ref 6–23)
CHLORIDE: 100 meq/L (ref 96–112)
CO2: 28 mEq/L (ref 19–32)
Calcium: 9.9 mg/dL (ref 8.4–10.5)
Creatinine, Ser: 0.6 mg/dL (ref 0.40–1.20)
GFR: 108.84 mL/min (ref >60.00)
Glucose, Bld: 90 mg/dL (ref 70–99)
POTASSIUM: 4.3 meq/L (ref 3.5–5.1)
Sodium: 138 mEq/L (ref 135–145)

## 2017-04-14 LAB — VITAMIN B12: Vitamin B-12: 482 pg/mL (ref 211–911)

## 2017-04-14 LAB — LIPID PANEL
Cholesterol: 229 mg/dL — ABNORMAL HIGH (ref 0–200)
HDL: 67.4 mg/dL (ref >39.00)
LDL CALC: 136 mg/dL — AB (ref 0–99)
NONHDL: 161.4
Total CHOL/HDL Ratio: 3
Triglycerides: 128 mg/dL (ref 0.0–149.0)
VLDL: 25.6 mg/dL (ref 0.0–40.0)
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
## 2017-04-14 NOTE — Progress Notes (Signed)
Pre visit review using our clinic review tool, if applicable. No additional management support is needed unless otherwise documented below in the visit note. 

## 2017-04-14 NOTE — Patient Instructions (Signed)
Ms. Basford , Thank you for taking time to come for your Medicare Wellness Visit. I appreciate your ongoing commitment to your health goals. Please review the following plan we discussed and let me know if I can assist you in the future.   These are the goals we discussed: Goals    . HEALTHY FOOD CHOICES          Starting 04/14/2017, I will continue to limit intake of fried foods to twice monthly.        This is a list of the screening recommended for you and due dates:  Health Maintenance  Topic Date Due  . Flu Shot  11/28/2017*  . DTaP/Tdap/Td vaccine (1 - Tdap) 09/23/2026*  . Mammogram  04/06/2018  . Colon Cancer Screening  06/27/2022  . Tetanus Vaccine  09/23/2026  .  Hepatitis C: One time screening is recommended by Center for Disease Control  (CDC) for  adults born from [birth year] through 1963.   Completed  . HIV Screening  Completed  *Topic was postponed. The date shown is not the original due date.   Preventive Care for Adults  A healthy lifestyle and preventive care can promote health and wellness. Preventive health guidelines for adults include the following key practices.  . A routine yearly physical is a good way to check with your health care provider about your health and preventive screening. It is a chance to share any concerns and updates on your health and to receive a thorough exam.  . Visit your dentist for a routine exam and preventive care every 6 months. Brush your teeth twice a day and floss once a day. Good oral hygiene prevents tooth decay and gum disease.  . The frequency of eye exams is based on your age, health, family medical history, use  of contact lenses, and other factors. Follow your health care provider's ecommendations for frequency of eye exams.  . Eat a healthy diet. Foods like vegetables, fruits, whole grains, low-fat dairy products, and lean protein foods contain the nutrients you need without too many calories. Decrease your intake of foods high  in solid fats, added sugars, and salt. Eat the right amount of calories for you. Get information about a proper diet from your health care provider, if necessary.  . Regular physical exercise is one of the most important things you can do for your health. Most adults should get at least 150 minutes of moderate-intensity exercise (any activity that increases your heart rate and causes you to sweat) each week. In addition, most adults need muscle-strengthening exercises on 2 or more days a week.  Silver Sneakers may be a benefit available to you. To determine eligibility, you may visit the website: www.silversneakers.com or contact program at (979)759-1743 Mon-Fri between 8AM-8PM.   . Maintain a healthy weight. The body mass index (BMI) is a screening tool to identify possible weight problems. It provides an estimate of body fat based on height and weight. Your health care provider can find your BMI and can help you achieve or maintain a healthy weight.   For adults 20 years and older: ? A BMI below 18.5 is considered underweight. ? A BMI of 18.5 to 24.9 is normal. ? A BMI of 25 to 29.9 is considered overweight. ? A BMI of 30 and above is considered obese.   . Maintain normal blood lipids and cholesterol levels by exercising and minimizing your intake of saturated fat. Eat a balanced diet with plenty of fruit and vegetables.  Blood tests for lipids and cholesterol should begin at age 39 and be repeated every 5 years. If your lipid or cholesterol levels are high, you are over 50, or you are at high risk for heart disease, you may need your cholesterol levels checked more frequently. Ongoing high lipid and cholesterol levels should be treated with medicines if diet and exercise are not working.  . If you smoke, find out from your health care provider how to quit. If you do not use tobacco, please do not start.  . If you choose to drink alcohol, please do not consume more than 2 drinks per day. One  drink is considered to be 12 ounces (355 mL) of beer, 5 ounces (148 mL) of wine, or 1.5 ounces (44 mL) of liquor.  . If you are 77-63 years old, ask your health care provider if you should take aspirin to prevent strokes.  . Use sunscreen. Apply sunscreen liberally and repeatedly throughout the day. You should seek shade when your shadow is shorter than you. Protect yourself by wearing long sleeves, pants, a wide-brimmed hat, and sunglasses year round, whenever you are outdoors.  . Once a month, do a whole body skin exam, using a mirror to look at the skin on your back. Tell your health care provider of new moles, moles that have irregular borders, moles that are larger than a pencil eraser, or moles that have changed in shape or color.

## 2017-04-14 NOTE — Progress Notes (Signed)
PCP notes:   Health maintenance:  HIV screening - completed Flu vaccine - addressed  Abnormal screenings:   Depression score: 2  Patient concerns:   A. Intermittent SOB B. Intermittent tingling in fingertips C. Anxiety - per pt, prescribed medication is not effective  Nurse concerns:  None  Next PCP appt:   04/19/17 @ 1200

## 2017-04-14 NOTE — Progress Notes (Signed)
I reviewed health advisor's note, was available for consultation, and agree with documentation and plan.  

## 2017-04-15 LAB — HIV ANTIBODY (ROUTINE TESTING W REFLEX): HIV 1&2 Ab, 4th Generation: NONREACTIVE

## 2017-04-19 ENCOUNTER — Ambulatory Visit (INDEPENDENT_AMBULATORY_CARE_PROVIDER_SITE_OTHER): Payer: Medicare HMO | Admitting: Family Medicine

## 2017-04-19 ENCOUNTER — Encounter: Payer: Self-pay | Admitting: Family Medicine

## 2017-04-19 VITALS — BP 128/74 | HR 79 | Temp 98.1°F | Ht 66.0 in | Wt 145.2 lb

## 2017-04-19 DIAGNOSIS — Z7189 Other specified counseling: Secondary | ICD-10-CM

## 2017-04-19 DIAGNOSIS — Z87891 Personal history of nicotine dependence: Secondary | ICD-10-CM | POA: Diagnosis not present

## 2017-04-19 DIAGNOSIS — Z95828 Presence of other vascular implants and grafts: Secondary | ICD-10-CM | POA: Diagnosis not present

## 2017-04-19 DIAGNOSIS — F4323 Adjustment disorder with mixed anxiety and depressed mood: Secondary | ICD-10-CM

## 2017-04-19 DIAGNOSIS — K219 Gastro-esophageal reflux disease without esophagitis: Secondary | ICD-10-CM | POA: Diagnosis not present

## 2017-04-19 DIAGNOSIS — E538 Deficiency of other specified B group vitamins: Secondary | ICD-10-CM

## 2017-04-19 DIAGNOSIS — Z89612 Acquired absence of left leg above knee: Secondary | ICD-10-CM | POA: Diagnosis not present

## 2017-04-19 DIAGNOSIS — E785 Hyperlipidemia, unspecified: Secondary | ICD-10-CM | POA: Diagnosis not present

## 2017-04-19 DIAGNOSIS — R69 Illness, unspecified: Secondary | ICD-10-CM | POA: Diagnosis not present

## 2017-04-19 DIAGNOSIS — Z0000 Encounter for general adult medical examination without abnormal findings: Secondary | ICD-10-CM

## 2017-04-19 DIAGNOSIS — S78112A Complete traumatic amputation at level between left hip and knee, initial encounter: Secondary | ICD-10-CM

## 2017-04-19 DIAGNOSIS — I700 Atherosclerosis of aorta: Secondary | ICD-10-CM

## 2017-04-19 MED ORDER — LORAZEPAM 0.5 MG PO TABS: 0.2500 mg | ORAL_TABLET | Freq: Two times a day (BID) | ORAL | 1 refills | Status: DC | PRN

## 2017-04-19 MED ORDER — PANTOPRAZOLE SODIUM 40 MG PO TBEC: 40.0000 mg | DELAYED_RELEASE_TABLET | Freq: Every day | ORAL | 0 refills | Status: DC

## 2017-04-19 MED ORDER — PANTOPRAZOLE SODIUM 40 MG PO TBEC: 40.0000 mg | DELAYED_RELEASE_TABLET | Freq: Every day | ORAL | 3 refills | Status: DC

## 2017-04-19 NOTE — Assessment & Plan Note (Signed)
Advanced directive - does not have set up. Daughter would be HCPOA. Has not had chance to set this up. She has packet at home.

## 2017-04-19 NOTE — Assessment & Plan Note (Signed)
Seen on routine chest xray last year. IVC filter must have been placed during trauma admission 2008 where she had knee amputation - on transfer to [facility] as I don't see mention of IVC filter after review of available records from our system. Will touch base with IR to see if they can tell if she had permanent or temporary IVC filter and discuss further plan as indicated. Otherwise may need to get records from [facility] hospitalization (06/2007).

## 2017-04-19 NOTE — Patient Instructions (Addendum)
Stop protonix. Let us know if heartburn symptoms develop.  We will refer you for lung cancer screening evaluation.  I will check on IVC filter plan.  Trial ativan (lorazepam) 1/2-1 tablet as needed for anxiety. Temporary medicine, let us know how you do with this.   Health Maintenance, Female Adopting a healthy lifestyle and getting preventive care can go a long way to promote health and wellness. Talk with your health care provider about what schedule of regular examinations is right for you. This is a good chance for you to check in with your provider about disease prevention and staying healthy. In between checkups, there are plenty of things you can do on your own. Experts have done a lot of research about which lifestyle changes and preventive measures are most likely to keep you healthy. Ask your health care provider for more information. Weight and diet Eat a healthy diet  Be sure to include plenty of vegetables, fruits, low-fat dairy products, and lean protein.  Do not eat a lot of foods high in solid fats, added sugars, or salt.  Get regular exercise. This is one of the most important things you can do for your health. ? Most adults should exercise for at least 150 minutes each week. The exercise should increase your heart rate and make you sweat (moderate-intensity exercise). ? Most adults should also do strengthening exercises at least twice a week. This is in addition to the moderate-intensity exercise.  Maintain a healthy weight  Body mass index (BMI) is a measurement that can be used to identify possible weight problems. It estimates body fat based on height and weight. Your health care provider can help determine your BMI and help you achieve or maintain a healthy weight.  For females 88 years of age and older: ? A BMI below 18.5 is considered underweight. ? A BMI of 18.5 to 24.9 is normal. ? A BMI of 25 to 29.9 is considered overweight. ? A BMI of 30 and above is considered  obese.  Watch levels of cholesterol and blood lipids  You should start having your blood tested for lipids and cholesterol at 59 years of age, then have this test every 5 years.  You may need to have your cholesterol levels checked more often if: ? Your lipid or cholesterol levels are high. ? You are older than 59 years of age. ? You are at high risk for heart disease.  Cancer screening Lung Cancer  Lung cancer screening is recommended for adults 71-75 years old who are at high risk for lung cancer because of a history of smoking.  A yearly low-dose CT scan of the lungs is recommended for people who: ? Currently smoke. ? Have quit within the past 15 years. ? Have at least a 30-pack-year history of smoking. A pack year is smoking an average of one pack of cigarettes a day for 1 year.  Yearly screening should continue until it has been 15 years since you quit.  Yearly screening should stop if you develop a health problem that would prevent you from having lung cancer treatment.  Breast Cancer  Practice breast self-awareness. This means understanding how your breasts normally appear and feel.  It also means doing regular breast self-exams. Let your health care provider know about any changes, no matter how small.  If you are in your 20s or 30s, you should have a clinical breast exam (CBE) by a health care provider every 1-3 years as part of a regular  health exam.  If you are 40 or older, have a CBE every year. Also consider having a breast X-ray (mammogram) every year.  If you have a family history of breast cancer, talk to your health care provider about genetic screening.  If you are at high risk for breast cancer, talk to your health care provider about having an MRI and a mammogram every year.  Breast cancer gene (BRCA) assessment is recommended for women who have family members with BRCA-related cancers. BRCA-related cancers  include: ? Breast. ? Ovarian. ? Tubal. ? Peritoneal cancers.  Results of the assessment will determine the need for genetic counseling and BRCA1 and BRCA2 testing.  Cervical Cancer Your health care provider may recommend that you be screened regularly for cancer of the pelvic organs (ovaries, uterus, and vagina). This screening involves a pelvic examination, including checking for microscopic changes to the surface of your cervix (Pap test). You may be encouraged to have this screening done every 3 years, beginning at age 21.  For women ages 30-65, health care providers may recommend pelvic exams and Pap testing every 3 years, or they may recommend the Pap and pelvic exam, combined with testing for human papilloma virus (HPV), every 5 years. Some types of HPV increase your risk of cervical cancer. Testing for HPV may also be done on women of any age with unclear Pap test results.  Other health care providers may not recommend any screening for nonpregnant women who are considered low risk for pelvic cancer and who do not have symptoms. Ask your health care provider if a screening pelvic exam is right for you.  If you have had past treatment for cervical cancer or a condition that could lead to cancer, you need Pap tests and screening for cancer for at least 20 years after your treatment. If Pap tests have been discontinued, your risk factors (such as having a new sexual partner) need to be reassessed to determine if screening should resume. Some women have medical problems that increase the chance of getting cervical cancer. In these cases, your health care provider may recommend more frequent screening and Pap tests.  Colorectal Cancer  This type of cancer can be detected and often prevented.  Routine colorectal cancer screening usually begins at 59 years of age and continues through 59 years of age.  Your health care provider may recommend screening at an earlier age if you have risk factors  for colon cancer.  Your health care provider may also recommend using home test kits to check for hidden blood in the stool.  A small camera at the end of a tube can be used to examine your colon directly (sigmoidoscopy or colonoscopy). This is done to check for the earliest forms of colorectal cancer.  Routine screening usually begins at age 50.  Direct examination of the colon should be repeated every 5-10 years through 59 years of age. However, you may need to be screened more often if early forms of precancerous polyps or small growths are found.  Skin Cancer  Check your skin from head to toe regularly.  Tell your health care provider about any new moles or changes in moles, especially if there is a change in a mole's shape or color.  Also tell your health care provider if you have a mole that is larger than the size of a pencil eraser.  Always use sunscreen. Apply sunscreen liberally and repeatedly throughout the day.  Protect yourself by wearing long sleeves, pants, a   wide-brimmed hat, and sunglasses whenever you are outside.  Heart disease, diabetes, and high blood pressure  High blood pressure causes heart disease and increases the risk of stroke. High blood pressure is more likely to develop in: ? People who have blood pressure in the high end of the normal range (130-139/85-89 mm Hg). ? People who are overweight or obese. ? People who are African American.  If you are 54-30 years of age, have your blood pressure checked every 3-5 years. If you are 3 years of age or older, have your blood pressure checked every year. You should have your blood pressure measured twice-once when you are at a hospital or clinic, and once when you are not at a hospital or clinic. Record the average of the two measurements. To check your blood pressure when you are not at a hospital or clinic, you can use: ? An automated blood pressure machine at a pharmacy. ? A home blood pressure monitor.  If  you are between 55 years and 21 years old, ask your health care provider if you should take aspirin to prevent strokes.  Have regular diabetes screenings. This involves taking a blood sample to check your fasting blood sugar level. ? If you are at a normal weight and have a low risk for diabetes, have this test once every three years after 59 years of age. ? If you are overweight and have a high risk for diabetes, consider being tested at a younger age or more often. Preventing infection Hepatitis B  If you have a higher risk for hepatitis B, you should be screened for this virus. You are considered at high risk for hepatitis B if: ? You were born in a country where hepatitis B is common. Ask your health care provider which countries are considered high risk. ? Your parents were born in a high-risk country, and you have not been immunized against hepatitis B (hepatitis B vaccine). ? You have HIV or AIDS. ? You use needles to inject street drugs. ? You live with someone who has hepatitis B. ? You have had sex with someone who has hepatitis B. ? You get hemodialysis treatment. ? You take certain medicines for conditions, including cancer, organ transplantation, and autoimmune conditions.  Hepatitis C  Blood testing is recommended for: ? Everyone born from [birth year] through 1963. ? Anyone with known risk factors for hepatitis C.  Sexually transmitted infections (STIs)  You should be screened for sexually transmitted infections (STIs) including gonorrhea and chlamydia if: ? You are sexually active and are younger than 59 years of age. ? You are older than 59 years of age and your health care provider tells you that you are at risk for this type of infection. ? Your sexual activity has changed since you were last screened and you are at an increased risk for chlamydia or gonorrhea. Ask your health care provider if you are at risk.  If you do not have HIV, but are at risk, it may be recommended  that you take a prescription medicine daily to prevent HIV infection. This is called pre-exposure prophylaxis (PrEP). You are considered at risk if: ? You are sexually active and do not regularly use condoms or know the HIV status of your partner(s). ? You take drugs by injection. ? You are sexually active with a partner who has HIV.  Talk with your health care provider about whether you are at high risk of being infected with HIV. If you choose to  begin PrEP, you should first be tested for HIV. You should then be tested every 3 months for as long as you are taking PrEP. Pregnancy  If you are premenopausal and you may become pregnant, ask your health care provider about preconception counseling.  If you may become pregnant, take 400 to 800 micrograms (mcg) of folic acid every day.  If you want to prevent pregnancy, talk to your health care provider about birth control (contraception). Osteoporosis and menopause  Osteoporosis is a disease in which the bones lose minerals and strength with aging. This can result in serious bone fractures. Your risk for osteoporosis can be identified using a bone density scan.  If you are 65 years of age or older, or if you are at risk for osteoporosis and fractures, ask your health care provider if you should be screened.  Ask your health care provider whether you should take a calcium or vitamin D supplement to lower your risk for osteoporosis.  Menopause may have certain physical symptoms and risks.  Hormone replacement therapy may reduce some of these symptoms and risks. Talk to your health care provider about whether hormone replacement therapy is right for you. Follow these instructions at home:  Schedule regular health, dental, and eye exams.  Stay current with your immunizations.  Do not use any tobacco products including cigarettes, chewing tobacco, or electronic cigarettes.  If you are pregnant, do not drink alcohol.  If you are  breastfeeding, limit how much and how often you drink alcohol.  Limit alcohol intake to no more than 1 drink per day for nonpregnant women. One drink equals 12 ounces of beer, 5 ounces of wine, or 1 ounces of hard liquor.  Do not use street drugs.  Do not share needles.  Ask your health care provider for help if you need support or information about quitting drugs.  Tell your health care provider if you often feel depressed.  Tell your health care provider if you have ever been abused or do not feel safe at home. This information is not intended to replace advice given to you by your health care provider. Make sure you discuss any questions you have with your health care provider. Document Released: 03/02/2011 Document Revised: 01/23/2016 Document Reviewed: 05/21/2015 Elsevier Interactive Patient Education  2018 [employer]  

## 2017-04-19 NOTE — Assessment & Plan Note (Addendum)
No significant GERD - suggested trial off protonix.

## 2017-04-19 NOTE — Assessment & Plan Note (Signed)
Preventative protocols reviewed and updated unless pt declined. Discussed healthy diet and lifestyle.  

## 2017-04-19 NOTE — Addendum Note (Signed)
Addended by: Ria Bush on: 04/19/2017 09:02 PM   Modules accepted: Orders

## 2017-04-19 NOTE — Assessment & Plan Note (Addendum)
Difficult year leading to worsening anxiety - excarbated after father's recent passing. Discussed stressors, support provided. Reviewed importance of healthy stress relieving strategies as well as medication options. Pt interested in medication. Hydroxyzine was previously ineffective. Will trial lorazepam - discussed risks of controlled substances specifically benzos to include habit forming nature of med, dependence and tolerance potential. Discussed temporary nature of medication. Will Rx lorazepam 0.5mg  1/2-1 tab BID PRN.

## 2017-04-19 NOTE — Assessment & Plan Note (Signed)
Chronic. Not on med. The 10-year ASCVD risk score Mikey Bussing DC Brooke Bonito., et al., 2013) is: 2.6%   Values used to calculate the score:     Age: 61 years     Sex: Female     Is Non-Hispanic African American: No     Diabetic: No     Tobacco smoker: No     Systolic Blood Pressure: 478 mmHg     Is BP treated: No     HDL Cholesterol: 67.4 mg/dL     Total Cholesterol: 229 mg/dL

## 2017-04-19 NOTE — Assessment & Plan Note (Signed)
Maintaining levels with oral B12.

## 2017-04-19 NOTE — Assessment & Plan Note (Signed)
Remains abstinent - quit 2016. Discussed lung cancer screening CT - she is interested if eligible so will refer.

## 2017-04-19 NOTE — Assessment & Plan Note (Deleted)
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
## 2017-04-20 ENCOUNTER — Other Ambulatory Visit: Payer: Self-pay | Admitting: Acute Care

## 2017-04-20 DIAGNOSIS — Z122 Encounter for screening for malignant neoplasm of respiratory organs: Secondary | ICD-10-CM

## 2017-04-20 DIAGNOSIS — Z87891 Personal history of nicotine dependence: Secondary | ICD-10-CM

## 2017-04-27 ENCOUNTER — Telehealth: Payer: Self-pay | Admitting: Acute Care

## 2017-04-28 ENCOUNTER — Inpatient Hospital Stay: Admission: RE | Admit: 2017-04-28 | Payer: Medicare HMO | Source: Ambulatory Visit

## 2017-04-28 ENCOUNTER — Encounter: Payer: Medicare HMO | Admitting: Acute Care

## 2017-04-28 NOTE — Telephone Encounter (Signed)
Will route to lung nodule pool. 

## 2017-04-29 NOTE — Telephone Encounter (Signed)
LMTC x 1  

## 2017-05-04 ENCOUNTER — Ambulatory Visit (INDEPENDENT_AMBULATORY_CARE_PROVIDER_SITE_OTHER): Payer: Medicare HMO | Admitting: Orthopedic Surgery

## 2017-05-04 ENCOUNTER — Encounter (INDEPENDENT_AMBULATORY_CARE_PROVIDER_SITE_OTHER): Payer: Self-pay | Admitting: Orthopedic Surgery

## 2017-05-04 DIAGNOSIS — M79652 Pain in left thigh: Secondary | ICD-10-CM | POA: Diagnosis not present

## 2017-05-04 DIAGNOSIS — Z89612 Acquired absence of left leg above knee: Secondary | ICD-10-CM

## 2017-05-04 DIAGNOSIS — S78112A Complete traumatic amputation at level between left hip and knee, initial encounter: Secondary | ICD-10-CM

## 2017-05-04 NOTE — Telephone Encounter (Signed)
Will close this message and refer to referral notes 
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
## 2017-05-19 ENCOUNTER — Telehealth: Payer: Self-pay | Admitting: Family Medicine

## 2017-05-19 ENCOUNTER — Encounter: Payer: Self-pay | Admitting: Family Medicine

## 2017-05-19 NOTE — Telephone Encounter (Signed)
plz call patient - I touched base with the radiologist about her IVC filter - she has a steel greenfield filter which is permanent. We don't need to do anything about this. If in the future she needs MRI for any reason, she would have to check with the MRI technician to make sure she can get an MRI done - would need to let them know she has a greenfield filter in place.

## 2017-05-19 NOTE — Telephone Encounter (Signed)
Spoke to pt

## 2017-05-20 ENCOUNTER — Telehealth (INDEPENDENT_AMBULATORY_CARE_PROVIDER_SITE_OTHER): Payer: Self-pay | Admitting: Radiology

## 2017-05-20 NOTE — Telephone Encounter (Signed)
Rachel Mcintosh has been trying to get patients MRI approved to r/o osteomyelitis vs abscess in left femur, she is already aka. Pt request was Denied for MR Femur Left wo contrast because Based on Medicare National coverage and Musculosketal imaging Guidelines did not support plain xray ast the initial imaging study for the evaluation of known or suspected infection. The clinical information provided does not describe results of a recent plain xray and, therefore, the requested imaging is not indicatated at this time.   If would like to do a peer to peer please call [phone] use reference # [id number].  FYI: I have already done an appeal and still denied it, not sure what else to do except P2P.  What would you like to do?

## 2017-05-20 NOTE — Telephone Encounter (Signed)
FYI I offered patient appointment for xray since mri was denied, she declined she believes pain is from prosthetic because she does not have any pain when not wearing it. She would like to follow up with [facility] before proceeding any further.

## 2017-05-20 NOTE — Telephone Encounter (Signed)
Have patient come in to obtain two-view radiographs of the left femur then we can proceed with the MRI scan.

## 2017-05-21 ENCOUNTER — Other Ambulatory Visit: Payer: Medicare HMO

## 2017-06-04 ENCOUNTER — Ambulatory Visit
Admission: RE | Admit: 2017-06-04 | Discharge: 2017-06-04 | Disposition: A | Discharge status: Home / Self Care | Payer: Medicare HMO | Source: Ambulatory Visit | Attending: Orthopedic Surgery | Admitting: Orthopedic Surgery

## 2017-06-04 DIAGNOSIS — S78112A Complete traumatic amputation at level between left hip and knee, initial encounter: Secondary | ICD-10-CM

## 2017-06-04 DIAGNOSIS — M79652 Pain in left thigh: Secondary | ICD-10-CM | POA: Diagnosis not present

## 2017-06-14 ENCOUNTER — Telehealth: Payer: Self-pay | Admitting: Family Medicine

## 2017-06-14 ENCOUNTER — Ambulatory Visit (INDEPENDENT_AMBULATORY_CARE_PROVIDER_SITE_OTHER): Payer: Medicare HMO | Admitting: Orthopedic Surgery

## 2017-06-14 ENCOUNTER — Encounter (INDEPENDENT_AMBULATORY_CARE_PROVIDER_SITE_OTHER): Payer: Self-pay | Admitting: Orthopedic Surgery

## 2017-06-14 DIAGNOSIS — Z87891 Personal history of nicotine dependence: Secondary | ICD-10-CM

## 2017-06-14 DIAGNOSIS — M79652 Pain in left thigh: Secondary | ICD-10-CM

## 2017-06-14 DIAGNOSIS — Z89612 Acquired absence of left leg above knee: Secondary | ICD-10-CM

## 2017-06-14 DIAGNOSIS — S78112A Complete traumatic amputation at level between left hip and knee, initial encounter: Secondary | ICD-10-CM

## 2017-06-14 NOTE — Telephone Encounter (Signed)
Received note from Eric Form saying they couldn't reach patient about the Lung Cancer program. I called patient and she said she is not interested at this time and she told the program that when they called her.

## 2017-06-14 NOTE — Assessment & Plan Note (Signed)
Declined lung cancer screening when they called 05/2017

## 2017-06-14 NOTE — Telephone Encounter (Signed)
Noted. Thanks.
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
## 2017-07-19 ENCOUNTER — Other Ambulatory Visit: Payer: Self-pay | Admitting: Family Medicine

## 2017-07-19 NOTE — Telephone Encounter (Signed)
Refill left on vm at pharmacy per Dr. G.   

## 2017-07-19 NOTE — Telephone Encounter (Signed)
plz phone in. 

## 2017-07-19 NOTE — Telephone Encounter (Signed)
Last filled:  05/25/17, #20 Last OV (CPE):  04/19/17 Next OV:  04/20/18

## 2017-07-21 ENCOUNTER — Encounter: Payer: Self-pay | Admitting: Family Medicine

## 2017-07-21 ENCOUNTER — Ambulatory Visit (INDEPENDENT_AMBULATORY_CARE_PROVIDER_SITE_OTHER): Payer: Medicare HMO | Admitting: Family Medicine

## 2017-07-21 DIAGNOSIS — J309 Allergic rhinitis, unspecified: Secondary | ICD-10-CM | POA: Insufficient documentation

## 2017-07-21 DIAGNOSIS — J302 Other seasonal allergic rhinitis: Secondary | ICD-10-CM

## 2017-07-21 MED ORDER — FLUTICASONE PROPIONATE 50 MCG/ACT NA SUSP: 2.0000 | Freq: Every day | NASAL | 6 refills | Status: DC

## 2017-07-21 MED ORDER — PREDNISONE 20 MG PO TABS: ORAL_TABLET | ORAL | 0 refills | Status: DC

## 2017-07-21 NOTE — Patient Instructions (Signed)
Good to see you today. I do think you have allergic rhinitis flare.  Treat with continued claritin, start flonase again - refilled today. Use nasal saline irrigation throughout the day.  Printed prescription for steroids provided today in case not improving with above.  Allergic Rhinitis Allergic rhinitis is when the mucous membranes in the nose respond to allergens. Allergens are particles in the air that cause your body to have an allergic reaction. This causes you to release allergic antibodies. Through a chain of events, these eventually cause you to release histamine into the blood stream. Although meant to protect the body, it is this release of histamine that causes your discomfort, such as frequent sneezing, congestion, and an itchy, runny nose. What are the causes? Seasonal allergic rhinitis (hay fever) is caused by pollen allergens that may come from grasses, trees, and weeds. Year-round allergic rhinitis (perennial allergic rhinitis) is caused by allergens such as house dust mites, pet dander, and mold spores. What are the signs or symptoms?  Nasal stuffiness (congestion).  Itchy, runny nose with sneezing and tearing of the eyes. How is this diagnosed? Your health care provider can help you determine the allergen or allergens that trigger your symptoms. If you and your health care provider are unable to determine the allergen, skin or blood testing may be used. Your health care provider will diagnose your condition after taking your health history and performing a physical exam. Your health care provider may assess you for other related conditions, such as asthma, pink eye, or an ear infection. How is this treated? Allergic rhinitis does not have a cure, but it can be controlled by:  Medicines that block allergy symptoms. These may include allergy shots, nasal sprays, and oral antihistamines.  Avoiding the allergen.  Hay fever may often be treated with antihistamines in pill or nasal  spray forms. Antihistamines block the effects of histamine. There are over-the-counter medicines that may help with nasal congestion and swelling around the eyes. Check with your health care provider before taking or giving this medicine. If avoiding the allergen or the medicine prescribed do not work, there are many new medicines your health care provider can prescribe. Stronger medicine may be used if initial measures are ineffective. Desensitizing injections can be used if medicine and avoidance does not work. Desensitization is when a patient is given ongoing shots until the body becomes less sensitive to the allergen. Make sure you follow up with your health care provider if problems continue. Follow these instructions at home: It is not possible to completely avoid allergens, but you can reduce your symptoms by taking steps to limit your exposure to them. It helps to know exactly what you are allergic to so that you can avoid your specific triggers. Contact a health care provider if:  You have a fever.  You develop a cough that does not stop easily (persistent).  You have shortness of breath.  You start wheezing.  Symptoms interfere with normal daily activities. This information is not intended to replace advice given to you by your health care provider. Make sure you discuss any questions you have with your health care provider. Document Released: 05/12/2001 Document Revised: 04/17/2016 Document Reviewed: 04/24/2013 Elsevier Interactive Patient Education  2017 [employer].

## 2017-07-21 NOTE — Assessment & Plan Note (Signed)
Anticipate allergic rhinitis flare. No signs of bacterial infection. Supportive care as per instructions - rec nasal saline, nasal steroid, antihistamine. If no better, will Rx prednisone (WASP provided today). Allergen avoidance reviewed. Update if not improving with treatment.

## 2017-07-21 NOTE — Progress Notes (Signed)
BP 118/82 (BP Location: Left Arm, Patient Position: Sitting, Cuff Size: Normal)   Pulse 85   Temp 98 F (36.7 C) (Oral)   Wt 145 lb (65.8 kg)   SpO2 97%   BMI 23.40 kg/m    CC: allergies Subjective:    Patient ID: Rachel Mcintosh, female    DOB: [birth year], 61 y.o.   MRN: [id number]  HPI: Rachel Mcintosh is a 61 y.o. female presenting on 07/21/2017 for Allergies (pressure in forehead, postnasal drip, sinus congestion in AM. Has taken Claritin, barely helpful. Had same sxs in the spring)   5d h/o forehead pressure and headache, nausea, post nasal drainage, worse in the morning. Mild dry cough with dyspnea. Some rhinorrhea. Nasal congestion in am.   Claritin helping some.  No fevers/chills, ear or tooth pain, ST, wheezing. No itchy, watery eyes.  No h/o asthma.  No sick contacts at home.  No outdoor exposure recently.   Relevant past medical, surgical, family and social history reviewed and updated as indicated. Interim medical history since our last visit reviewed. Allergies and medications reviewed and updated. Outpatient Medications Prior to Visit  Medication Sig Dispense Refill  . Calcium Carbonate-Vitamin D3 (CALCIUM 600/VITAMIN D) 600-400 MG-UNIT TABS Take 1 tablet by mouth daily.    . cyanocobalamin (V-R VITAMIN B-12) 500 MCG tablet Take 1 tablet (500 mcg total) by mouth daily. (Patient taking differently: Take 500 mcg by mouth 3 (three) times a week. )    . LORazepam (ATIVAN) 0.5 MG tablet TAKE 1/2 TO 1 TABLET BY MOUTH TWICE A DAY AS NEEDED AS DIRECTED. FOR ANXIETY 20 tablet 0  . Naproxen Sodium (ALEVE PO) Take 1 tablet by mouth as needed.     No facility-administered medications prior to visit.      Per HPI unless specifically indicated in ROS section below Review of Systems     Objective:    BP 118/82 (BP Location: Left Arm, Patient Position: Sitting, Cuff Size: Normal)   Pulse 85   Temp 98 F (36.7 C) (Oral)   Wt 145 lb (65.8 kg)   SpO2 97%   BMI 23.40 kg/m    Wt Readings from Last 3 Encounters:  07/21/17 145 lb (65.8 kg)  04/19/17 145 lb 4 oz (65.9 kg)  04/14/17 144 lb 8 oz (65.5 kg)    Physical Exam  Constitutional: She appears well-developed and well-nourished. No distress.  HENT:  Head: Normocephalic and atraumatic.  Right Ear: Hearing, tympanic membrane, external ear and ear canal normal.  Left Ear: Hearing, tympanic membrane, external ear and ear canal normal.  Nose: Mucosal edema (dry nasal mucosal congestion) present. No rhinorrhea. Right sinus exhibits no maxillary sinus tenderness and no frontal sinus tenderness. Left sinus exhibits no maxillary sinus tenderness and no frontal sinus tenderness.  Mouth/Throat: Uvula is midline, oropharynx is clear and moist and mucous membranes are normal. No oropharyngeal exudate, posterior oropharyngeal edema, posterior oropharyngeal erythema or tonsillar abscesses.  Eyes: Conjunctivae and EOM are normal. Pupils are equal, round, and reactive to light. No scleral icterus.  Neck: Normal range of motion. Neck supple.  Cardiovascular: Normal rate, regular rhythm, normal heart sounds and intact distal pulses.  No murmur heard. Pulmonary/Chest: Effort normal and breath sounds normal. No respiratory distress. She has no wheezes. She has no rales.  Lymphadenopathy:    She has no cervical adenopathy.  Skin: Skin is warm and dry. No rash noted.  Nursing note and vitals reviewed.      Assessment &  Plan:   Problem List Items Addressed This Visit    Allergic rhinitis    Anticipate allergic rhinitis flare. No signs of bacterial infection. Supportive care as per instructions - rec nasal saline, nasal steroid, antihistamine. If no better, will Rx prednisone (WASP provided today). Allergen avoidance reviewed. Update if not improving with treatment.           Follow up plan: Return if symptoms worsen or fail to improve.  Ria Bush, MD

## 2017-08-04 ENCOUNTER — Encounter: Payer: Self-pay | Admitting: Family Medicine

## 2017-08-05 ENCOUNTER — Encounter: Payer: Self-pay | Admitting: Family Medicine

## 2017-08-06 ENCOUNTER — Telehealth: Payer: Self-pay | Admitting: Family Medicine

## 2017-08-06 MED ORDER — AMOXICILLIN-POT CLAVULANATE 875-125 MG PO TABS: 1.0000 | ORAL_TABLET | Freq: Two times a day (BID) | ORAL | 0 refills | Status: DC

## 2017-08-06 NOTE — Telephone Encounter (Signed)
Copied from [place] [id number]. Topic: Quick Communication - See Telephone Encounter >> Aug 06, 2017  8:12 AM Arletha Grippe wrote: CRM for notification. See Telephone encounter for:   08/06/17. Pt is still having congestion. The nasal spray is helping. Pt is wanting to know if she can get another dose of prednisone if that will help.  [facility]  Cb number Is [phone]

## 2017-08-06 NOTE — Telephone Encounter (Deleted)
Copied from [place] [id number]. Topic: Quick Communication - See Telephone Encounter >> Aug 06, 2017  8:12 AM Arletha Grippe wrote: CRM for notification. See Telephone encounter for:   08/06/17. Pt is still having congestion. The nasal spray is helping. Pt is wanting to know if she can get another dose of prednisone if that will help.  [facility].  Cb number Is [phone]

## 2017-08-09 ENCOUNTER — Encounter: Payer: Self-pay | Admitting: Family Medicine

## 2017-08-11 ENCOUNTER — Encounter: Payer: Self-pay | Admitting: Family Medicine

## 2017-08-11 MED ORDER — AZITHROMYCIN 250 MG PO TABS: ORAL_TABLET | ORAL | 0 refills | Status: DC

## 2017-08-11 NOTE — Telephone Encounter (Deleted)
I never received this message - can you help me figure out why it wasn't forwarded? thanks

## 2017-08-11 NOTE — Telephone Encounter (Signed)
Responded today.

## 2017-08-17 ENCOUNTER — Encounter: Payer: Self-pay | Admitting: Family Medicine

## 2017-08-17 ENCOUNTER — Ambulatory Visit (INDEPENDENT_AMBULATORY_CARE_PROVIDER_SITE_OTHER): Payer: Medicare HMO | Admitting: Family Medicine

## 2017-08-17 ENCOUNTER — Telehealth: Payer: Self-pay | Admitting: *Deleted

## 2017-08-17 VITALS — BP 120/78 | HR 74 | Temp 98.6°F | Wt 147.0 lb

## 2017-08-17 DIAGNOSIS — N3001 Acute cystitis with hematuria: Secondary | ICD-10-CM

## 2017-08-17 LAB — POC URINALSYSI DIPSTICK (AUTOMATED)
Bilirubin, UA: NEGATIVE
Glucose, UA: NEGATIVE
Ketones, UA: NEGATIVE
NITRITE UA: NEGATIVE
PH UA: 6 (ref 5.0–8.0)
PROTEIN UA: NEGATIVE
Spec Grav, UA: <=1.005 — AB (ref 1.010–1.025)
UROBILINOGEN UA: 0.2 U/dL

## 2017-08-17 MED ORDER — NITROFURANTOIN MONOHYD MACRO 100 MG PO CAPS: 100.0000 mg | ORAL_CAPSULE | Freq: Two times a day (BID) | ORAL | 0 refills | Status: DC

## 2017-08-17 NOTE — Addendum Note (Signed)
Addended by: Brenton Grills on: 73/71/0626 02:00 PM   Modules accepted: Orders

## 2017-08-17 NOTE — Telephone Encounter (Signed)
Copied from [place] [id number]. Topic: Appointment Scheduling - Same Day Appointment >> Aug 17, 2017  8:23 AM Scherrie Gerlach wrote: Patient called to schedule an appointment for TODAY with Danise Mina.  Pt thinks she has a UTI.  Declined to go to another office because she is not able since she is amputee. Pt would at least like to come and give an urine.  Please advise , thanks!

## 2017-08-17 NOTE — Progress Notes (Addendum)
BP 120/78 (BP Location: Left Arm, Patient Position: Sitting, Cuff Size: Normal)   Pulse 74   Temp 98.6 F (37 C) (Oral)   Wt 147 lb (66.7 kg)   SpO2 96%   BMI 23.73 kg/m    CC: ?UTI Subjective:    Patient ID: Rachel Mcintosh, female    DOB: Apr 27, 1956, 61 y.o.   MRN: [id number]  HPI: Rachel Mcintosh is a 61 y.o. female presenting on 08/17/2017 for Urinary Urgency (Started 08/14/17) and Abdominal Pain (Subpubic pressure. Took AZO)   3d h/o urinary urgency, suprapubic pressure and lower back discomfort. No dysuria, fevers/chills, nausea/vomiting, flank pain. No hematuria.  Has treated with Azo.  No recent abx. H/o sulfa allergy/intolerance.  Relevant past medical, surgical, family and social history reviewed and updated as indicated. Interim medical history since our last visit reviewed. Allergies and medications reviewed and updated. Outpatient Medications Prior to Visit  Medication Sig Dispense Refill  . Calcium Carbonate-Vitamin D3 (CALCIUM 600/VITAMIN D) 600-400 MG-UNIT TABS Take 1 tablet by mouth daily.    . cyanocobalamin (V-R VITAMIN B-12) 500 MCG tablet Take 1 tablet (500 mcg total) by mouth daily. (Patient taking differently: Take 500 mcg by mouth 3 (three) times a week. )    . fluticasone (FLONASE) 50 MCG/ACT nasal spray Place 2 sprays into both nostrils daily. 16 g 6  . LORazepam (ATIVAN) 0.5 MG tablet TAKE 1/2 TO 1 TABLET BY MOUTH TWICE A DAY AS NEEDED AS DIRECTED. FOR ANXIETY 20 tablet 0  . Naproxen Sodium (ALEVE PO) Take 1 tablet by mouth as needed.    Marland Kitchen azithromycin (ZITHROMAX) 250 MG tablet Take two tablets on day one followed by one tablet on days 2-5 6 each 0  . predniSONE (DELTASONE) 20 MG tablet Take two tablets daily for 3 days followed by one tablet daily for 4 days 10 tablet 0   No facility-administered medications prior to visit.      Per HPI unless specifically indicated in ROS section below Review of Systems     Objective:    BP 120/78 (BP Location:  Left Arm, Patient Position: Sitting, Cuff Size: Normal)   Pulse 74   Temp 98.6 F (37 C) (Oral)   Wt 147 lb (66.7 kg)   SpO2 96%   BMI 23.73 kg/m   Wt Readings from Last 3 Encounters:  08/17/17 147 lb (66.7 kg)  07/21/17 145 lb (65.8 kg)  04/19/17 145 lb 4 oz (65.9 kg)    Physical Exam  Constitutional: She appears well-developed and well-nourished. No distress.  Abdominal: Soft. Bowel sounds are normal. She exhibits no distension and no mass. There is no tenderness. There is no rigidity, no rebound, no guarding, no CVA tenderness and negative Murphy's sign.  Musculoskeletal: She exhibits no edema.  S/p L AKA  Psychiatric: She has a normal mood and affect.  Nursing note and vitals reviewed.  Results for orders placed or performed in visit on 08/17/17  POCT Urinalysis Dipstick (Automated)  Result Value Ref Range   Color, UA yellow    Clarity, UA clear    Glucose, UA negative    Bilirubin, UA negative    Ketones, UA negative    Spec Grav, UA <=1.005 (A) 1.010 - 1.025   Blood, UA 1+    pH, UA 6.0 5.0 - 8.0   Protein, UA negative    Urobilinogen, UA 0.2 0.2 or 1.0 E.U./dL   Nitrite, UA negative    Leukocytes, UA Small (1+) (A) Negative  Micro: WBC 5-10 RBC 1-5 Bact tr Casts none Epi none UCx sent    Assessment & Plan:   Problem List Items Addressed This Visit    Acute cystitis with hematuria - Primary    Uncomplicated. Treat with macrobid 7d course. Supportive care reviewed. Update if not improving with treatment.  UCx sent.       Relevant Orders   Urine Culture       Follow up plan: Return if symptoms worsen or fail to improve.  Ria Bush, MD

## 2017-08-17 NOTE — Telephone Encounter (Signed)
Pt aware.

## 2017-08-17 NOTE — Telephone Encounter (Signed)
plz schedule at 12:30pm.

## 2017-08-17 NOTE — Patient Instructions (Signed)

## 2017-08-17 NOTE — Assessment & Plan Note (Signed)
Uncomplicated. Treat with macrobid 7d course. Supportive care reviewed. Update if not improving with treatment.  UCx sent.

## 2017-08-19 LAB — URINE CULTURE
MICRO NUMBER:: 81421801
SPECIMEN QUALITY:: ADEQUATE

## 2017-08-20 ENCOUNTER — Other Ambulatory Visit: Payer: Self-pay | Admitting: Family Medicine

## 2017-08-20 MED ORDER — CEPHALEXIN 500 MG PO CAPS: 500.0000 mg | ORAL_CAPSULE | Freq: Two times a day (BID) | ORAL | 0 refills | Status: DC

## 2017-09-14 ENCOUNTER — Telehealth: Payer: Self-pay | Admitting: Family Medicine

## 2017-09-14 NOTE — Telephone Encounter (Signed)
Signed and in Lisa's box.

## 2017-09-14 NOTE — Telephone Encounter (Signed)
Placed form in Dr. G's box.  

## 2017-09-14 NOTE — Telephone Encounter (Signed)
Left message on vm per dpr notifying pt her disability parking form is ready to pick up. [Placed form at front office.]

## 2017-09-14 NOTE — Telephone Encounter (Signed)
Pt dropped off disability parking placard to be filled out Paperwork in dr g rx tower up front

## 2017-09-30 DIAGNOSIS — R69 Illness, unspecified: Secondary | ICD-10-CM | POA: Diagnosis not present

## 2017-10-12 DIAGNOSIS — R69 Illness, unspecified: Secondary | ICD-10-CM | POA: Diagnosis not present

## 2017-11-01 ENCOUNTER — Other Ambulatory Visit: Payer: Self-pay | Admitting: Family Medicine

## 2017-11-01 NOTE — Telephone Encounter (Signed)
Last filled:  09/10/17, #20 Last OV:  08/17/17 Next OV:  04/20/18

## 2017-11-02 NOTE — Telephone Encounter (Signed)
Eprescribed.

## 2017-11-22 ENCOUNTER — Ambulatory Visit (INDEPENDENT_AMBULATORY_CARE_PROVIDER_SITE_OTHER): Payer: Medicare HMO | Admitting: Family Medicine

## 2017-11-22 ENCOUNTER — Encounter: Payer: Self-pay | Admitting: Family Medicine

## 2017-11-22 VITALS — BP 118/68 | HR 80 | Temp 98.4°F | Resp 12 | Ht 66.0 in | Wt 145.0 lb

## 2017-11-22 DIAGNOSIS — R5381 Other malaise: Secondary | ICD-10-CM | POA: Insufficient documentation

## 2017-11-22 NOTE — Patient Instructions (Addendum)
Start flonase and antihistamine regularly Possible viral infection - supportive care for now.  Let us know if worsening symptoms, or persistent past 10 days.  EKG today.

## 2017-11-22 NOTE — Assessment & Plan Note (Signed)
Nonspecific symptoms over the past week - suspect viral illness. Supportive care reviewed. Update if new or worsening symptoms. Pt agrees with plan.  For dyspnea, check EKG - no acute findings Low threshold to order CXR if no improvement noted.

## 2017-11-22 NOTE — Progress Notes (Signed)
BP 118/68 (BP Location: Right Arm, Patient Position: Sitting, Cuff Size: Normal)   Pulse 80   Temp 98.4 F (36.9 C) (Oral)   Resp 12   Ht 5\' 6"  (1.676 m)   Wt 145 lb (65.8 kg)   SpO2 96%   BMI 23.40 kg/m    CC: malaise Subjective:    Patient ID: Rachel Mcintosh, female    DOB: July 09, 1956, 62 y.o.   MRN: [id number]  HPI: Rachel Mcintosh is a 62 y.o. female presenting on 11/22/2017 for Nausea; Body Aches Stiffness; and Shortness of Breath   1 wk h/o body ache, malaise - woke up this morning with epigastric discomfort radiating to shoulderblades, staying nauseated, feels "jittery feeling" not improved with ativan. Mild non productive cough, ST. Dyspnea worse with exertion. Chronic urgency/frequency.  No fevers, ear or tooth pain, headaches, or wheezing. No abd pain, vomiting, diarrhea/constipation, urinary symptoms like dysuria, hematuria or flank pain. No chest pain or significant palpitations.   Treating with flonase and claritin but not regularly.  Mother was sick recently.  No smokers at home.  H/o PNA (07/2015)  Relevant past medical, surgical, family and social history reviewed and updated as indicated. Interim medical history since our last visit reviewed. Allergies and medications reviewed and updated. Outpatient Medications Prior to Visit  Medication Sig Dispense Refill  . Calcium Carbonate-Vitamin D3 (CALCIUM 600/VITAMIN D) 600-400 MG-UNIT TABS Take 1 tablet by mouth daily.    . cyanocobalamin (V-R VITAMIN B-12) 500 MCG tablet Take 1 tablet (500 mcg total) by mouth daily. (Patient taking differently: Take 500 mcg by mouth 3 (three) times a week. )    . fluticasone (FLONASE) 50 MCG/ACT nasal spray Place 2 sprays into both nostrils daily. 16 g 6  . LORazepam (ATIVAN) 0.5 MG tablet TAKE 1/2 TO 1 TABLET BY MOUTH TWICE A DAY AS NEEDED AS DIRECTED. FOR ANXIETY 20 tablet 1  . Naproxen Sodium (ALEVE PO) Take 1 tablet by mouth as needed.    . cephALEXin (KEFLEX) 500 MG capsule Take  1 capsule (500 mg total) by mouth 2 (two) times daily. 14 capsule 0   No facility-administered medications prior to visit.      Per HPI unless specifically indicated in ROS section below Review of Systems     Objective:    BP 118/68 (BP Location: Right Arm, Patient Position: Sitting, Cuff Size: Normal)   Pulse 80   Temp 98.4 F (36.9 C) (Oral)   Resp 12   Ht 5\' 6"  (1.676 m)   Wt 145 lb (65.8 kg)   SpO2 96%   BMI 23.40 kg/m   Wt Readings from Last 3 Encounters:  11/22/17 145 lb (65.8 kg)  08/17/17 147 lb (66.7 kg)  07/21/17 145 lb (65.8 kg)    Physical Exam  Constitutional: She appears well-developed and well-nourished. No distress.  HENT:  Head: Normocephalic and atraumatic.  Right Ear: Hearing, tympanic membrane, external ear and ear canal normal.  Left Ear: Hearing, tympanic membrane, external ear and ear canal normal.  Nose: No mucosal edema or rhinorrhea. Right sinus exhibits no maxillary sinus tenderness and no frontal sinus tenderness. Left sinus exhibits no maxillary sinus tenderness and no frontal sinus tenderness.  Mouth/Throat: Uvula is midline, oropharynx is clear and moist and mucous membranes are normal. No oropharyngeal exudate, posterior oropharyngeal edema, posterior oropharyngeal erythema or tonsillar abscesses.  Mild nasal congestion  Eyes: Pupils are equal, round, and reactive to light. Conjunctivae and EOM are normal. No scleral icterus.  Neck: Normal range of motion. Neck supple.  Cardiovascular: Normal rate, regular rhythm, normal heart sounds and intact distal pulses.  No murmur heard. Pulmonary/Chest: Effort normal and breath sounds normal. No respiratory distress. She has no wheezes. She has no rales.  Lymphadenopathy:    She has no cervical adenopathy.  Skin: Skin is warm and dry. No rash noted.  Nursing note and vitals reviewed.  EKG: NSR rate 75, normal axis, intervals, no acute ST/T changes, rSR V1.     Assessment & Plan:   Problem List  Items Addressed This Visit    Malaise - Primary    Nonspecific symptoms over the past week - suspect viral illness. Supportive care reviewed. Update if new or worsening symptoms. Pt agrees with plan.  For dyspnea, check EKG - no acute findings Low threshold to order CXR if no improvement noted.       Relevant Orders   EKG 12-Lead (Completed)       No orders of the defined types were placed in this encounter.  Orders Placed This Encounter  Procedures  . EKG 12-Lead    Follow up plan: Return if symptoms worsen or fail to improve.  Ria Bush, MD

## 2017-12-21 ENCOUNTER — Ambulatory Visit (INDEPENDENT_AMBULATORY_CARE_PROVIDER_SITE_OTHER): Payer: Medicare HMO | Admitting: Family Medicine

## 2017-12-21 ENCOUNTER — Other Ambulatory Visit: Payer: Self-pay | Admitting: Family Medicine

## 2017-12-21 ENCOUNTER — Encounter: Payer: Self-pay | Admitting: Family Medicine

## 2017-12-21 VITALS — BP 144/79 | HR 73 | Temp 98.1°F | Resp 16 | Wt 145.2 lb

## 2017-12-21 DIAGNOSIS — R1030 Lower abdominal pain, unspecified: Secondary | ICD-10-CM

## 2017-12-21 DIAGNOSIS — R358 Other polyuria: Secondary | ICD-10-CM

## 2017-12-21 DIAGNOSIS — Z87440 Personal history of urinary (tract) infections: Secondary | ICD-10-CM | POA: Diagnosis not present

## 2017-12-21 DIAGNOSIS — R350 Frequency of micturition: Secondary | ICD-10-CM

## 2017-12-21 LAB — POCT URINALYSIS DIP (MANUAL ENTRY)
BILIRUBIN UA: NEGATIVE
BILIRUBIN UA: NEGATIVE mg/dL
Blood, UA: NEGATIVE
Glucose, UA: NEGATIVE mg/dL
LEUKOCYTES UA: NEGATIVE
Nitrite, UA: NEGATIVE
PROTEIN UA: NEGATIVE mg/dL
Spec Grav, UA: <=1.005 — AB (ref 1.010–1.025)
Urobilinogen, UA: 0.2 E.U./dL
pH, UA: 6.5 (ref 5.0–8.0)

## 2017-12-21 NOTE — Addendum Note (Signed)
Addended by: Gretchen Short on: 12/21/2017 02:27 PM   Modules accepted: Orders

## 2017-12-21 NOTE — Patient Instructions (Signed)

## 2017-12-21 NOTE — Progress Notes (Signed)
   Subjective:    Patient ID: Rachel Mcintosh is a 62 y.o. female presenting with Abdominal Pain (lower pressure; back pain x 2-3 weeks)  on 12/21/2017  HPI: S/p TAH in 2001. Still has her ovaries. She does not need pap smears. Feels some pressure with urination. Notes something awakens her in the early morning. She has pain in lower abdomen and low back R>L. Feels some nausea. Normal bowel function. This is going on x 3 wks. No dysuria.  Review of Systems  Constitutional: Negative for chills and fever.  Respiratory: Negative for shortness of breath.   Cardiovascular: Negative for chest pain.  Gastrointestinal: Negative for abdominal pain, nausea and vomiting.  Genitourinary: Negative for dysuria.  Skin: Negative for rash.      Objective:    BP (!) 144/79 (BP Location: Right Arm, Patient Position: Sitting, Cuff Size: Large)   Pulse 73   Temp 98.1 F (36.7 C) (Oral)   Resp 16   Wt 145 lb 3.2 oz (65.9 kg)   BMI 23.44 kg/m  Physical Exam  Constitutional: She is oriented to person, place, and time. She appears well-developed and well-nourished. No distress.  HENT:  Head: Normocephalic and atraumatic.  Eyes: No scleral icterus.  Neck: Neck supple.  Cardiovascular: Normal rate.  Pulmonary/Chest: Effort normal.  Abdominal: Soft. She exhibits no mass. There is no tenderness. There is no CVA tenderness.  Neurological: She is alert and oriented to person, place, and time.  Skin: Skin is warm and dry.  Psychiatric: She has a normal mood and affect.        Assessment & Plan:  Frequency of urination and polyuria - Plan: POCT urinalysis dipstick, Urine Culture, US PELVIS TRANSVANGINAL NON-OB (TV ONLY)  History of UTI - Urine is negative--check culture - Plan: POCT urinalysis dipstick, Urine Culture  Lower abdominal pain - will check u/s to r/o ovarian pathology--see PCP if w/u is negative. - Plan: US PELVIS TRANSVANGINAL NON-OB (TV ONLY)   Total face-to-face time with patient: 20  minutes. Over 50% of encounter was spent on counseling and coordination of care. Return if symptoms worsen or fail to improve.  Donnamae Jude 12/21/2017 11:38 AM

## 2017-12-22 LAB — URINE CULTURE

## 2017-12-30 ENCOUNTER — Ambulatory Visit (HOSPITAL_COMMUNITY)
Admission: RE | Admit: 2017-12-30 | Discharge: 2017-12-30 | Disposition: A | Discharge status: Home / Self Care | Payer: Medicare HMO | Source: Ambulatory Visit | Attending: Family Medicine | Admitting: Family Medicine

## 2017-12-30 DIAGNOSIS — Z90710 Acquired absence of both cervix and uterus: Secondary | ICD-10-CM | POA: Insufficient documentation

## 2017-12-30 DIAGNOSIS — R1030 Lower abdominal pain, unspecified: Secondary | ICD-10-CM | POA: Insufficient documentation

## 2017-12-30 DIAGNOSIS — R350 Frequency of micturition: Secondary | ICD-10-CM

## 2017-12-30 DIAGNOSIS — R358 Other polyuria: Secondary | ICD-10-CM

## 2018-01-19 ENCOUNTER — Other Ambulatory Visit: Payer: Self-pay | Admitting: Family Medicine

## 2018-01-19 NOTE — Telephone Encounter (Signed)
Last filled:  12/22/17, #20 Last OV:  11/22/17 Next OV (CPE):  04/20/18

## 2018-01-20 NOTE — Telephone Encounter (Signed)
Eprescribed.

## 2018-02-21 ENCOUNTER — Encounter: Payer: Self-pay | Admitting: Family

## 2018-02-21 ENCOUNTER — Ambulatory Visit (INDEPENDENT_AMBULATORY_CARE_PROVIDER_SITE_OTHER): Payer: Medicare HMO | Admitting: Family

## 2018-02-21 ENCOUNTER — Other Ambulatory Visit: Payer: Medicare HMO

## 2018-02-21 VITALS — BP 124/82 | HR 93 | Temp 98.2°F | Ht 66.0 in | Wt 144.1 lb

## 2018-02-21 DIAGNOSIS — R300 Dysuria: Secondary | ICD-10-CM | POA: Diagnosis not present

## 2018-02-21 LAB — POC URINALSYSI DIPSTICK (AUTOMATED)
Bilirubin, UA: NEGATIVE
GLUCOSE UA: NEGATIVE
Ketones, UA: NEGATIVE
NITRITE UA: NEGATIVE
PH UA: 6 (ref 5.0–8.0)
PROTEIN UA: NEGATIVE
RBC UA: NEGATIVE
SPEC GRAV UA: 1.01 (ref 1.010–1.025)
UROBILINOGEN UA: 0.2 U/dL

## 2018-02-21 MED ORDER — NITROFURANTOIN MONOHYD MACRO 100 MG PO CAPS: 100.0000 mg | ORAL_CAPSULE | Freq: Two times a day (BID) | ORAL | 0 refills | Status: DC
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
## 2018-02-21 NOTE — Addendum Note (Signed)
Addended by: Marcina Millard on: 02/21/2018 03:29 PM   Modules accepted: Orders

## 2018-02-22 LAB — URINE CULTURE
MICRO NUMBER: 90751684
SPECIMEN QUALITY: ADEQUATE

## 2018-03-09 DIAGNOSIS — H5213 Myopia, bilateral: Secondary | ICD-10-CM | POA: Diagnosis not present

## 2018-03-09 DIAGNOSIS — H524 Presbyopia: Secondary | ICD-10-CM | POA: Diagnosis not present

## 2018-03-09 DIAGNOSIS — H52213 Irregular astigmatism, bilateral: Secondary | ICD-10-CM | POA: Diagnosis not present

## 2018-04-14 ENCOUNTER — Other Ambulatory Visit: Payer: Self-pay | Admitting: Family Medicine

## 2018-04-14 DIAGNOSIS — E785 Hyperlipidemia, unspecified: Secondary | ICD-10-CM

## 2018-04-14 DIAGNOSIS — E538 Deficiency of other specified B group vitamins: Secondary | ICD-10-CM

## 2018-04-15 ENCOUNTER — Ambulatory Visit (INDEPENDENT_AMBULATORY_CARE_PROVIDER_SITE_OTHER): Payer: Medicare HMO

## 2018-04-15 ENCOUNTER — Ambulatory Visit: Payer: Medicare HMO

## 2018-04-15 VITALS — BP 124/88 | HR 70 | Temp 98.0°F | Ht 66.0 in | Wt 145.2 lb

## 2018-04-15 DIAGNOSIS — Z0000 Encounter for general adult medical examination without abnormal findings: Secondary | ICD-10-CM | POA: Diagnosis not present

## 2018-04-15 DIAGNOSIS — E785 Hyperlipidemia, unspecified: Secondary | ICD-10-CM | POA: Diagnosis not present

## 2018-04-15 DIAGNOSIS — E538 Deficiency of other specified B group vitamins: Secondary | ICD-10-CM | POA: Diagnosis not present

## 2018-04-15 LAB — CBC WITH DIFFERENTIAL/PLATELET
BASOS ABS: 0.1 10*3/uL (ref 0.0–0.1)
Basophils Relative: 1.9 % (ref 0.0–3.0)
Eosinophils Absolute: 0.1 10*3/uL (ref 0.0–0.7)
Eosinophils Relative: 2.2 % (ref 0.0–5.0)
HCT: 40.9 % (ref 36.0–46.0)
Hemoglobin: 14.1 g/dL (ref 12.0–15.0)
LYMPHS ABS: 1.6 10*3/uL (ref 0.7–4.0)
Lymphocytes Relative: 30.2 % (ref 12.0–46.0)
MCHC: 34.5 g/dL (ref 30.0–36.0)
MCV: 84.1 fl (ref 78.0–100.0)
MONO ABS: 0.3 10*3/uL (ref 0.1–1.0)
MONOS PCT: 6.3 % (ref 3.0–12.0)
NEUTROS PCT: 59.4 % (ref 43.0–77.0)
Neutro Abs: 3.1 10*3/uL (ref 1.4–7.7)
Platelets: 316 10*3/uL (ref 150.0–400.0)
RBC: 4.86 Mil/uL (ref 3.87–5.11)
RDW: 13.3 % (ref 11.5–15.5)
WBC: 5.3 10*3/uL (ref 4.0–10.5)

## 2018-04-15 LAB — COMPREHENSIVE METABOLIC PANEL
ALT: 11 U/L (ref 0–35)
AST: 14 U/L (ref 0–37)
Albumin: 4.4 g/dL (ref 3.5–5.2)
Alkaline Phosphatase: 55 U/L (ref 39–117)
BILIRUBIN TOTAL: 0.5 mg/dL (ref 0.2–1.2)
BUN: 9 mg/dL (ref 6–23)
CO2: 31 meq/L (ref 19–32)
CREATININE: 0.66 mg/dL (ref 0.40–1.20)
Calcium: 9.9 mg/dL (ref 8.4–10.5)
Chloride: 104 mEq/L (ref 96–112)
GFR: 97.17 mL/min (ref >60.00)
GLUCOSE: 99 mg/dL (ref 70–99)
Potassium: 4 mEq/L (ref 3.5–5.1)
Sodium: 142 mEq/L (ref 135–145)
Total Protein: 7.3 g/dL (ref 6.0–8.3)

## 2018-04-15 LAB — LIPID PANEL
CHOL/HDL RATIO: 3
Cholesterol: 217 mg/dL — ABNORMAL HIGH (ref 0–200)
HDL: 74.5 mg/dL (ref >39.00)
LDL Cholesterol: 129 mg/dL — ABNORMAL HIGH (ref 0–99)
NONHDL: 142.32
Triglycerides: 65 mg/dL (ref 0.0–149.0)
VLDL: 13 mg/dL (ref 0.0–40.0)

## 2018-04-15 LAB — VITAMIN B12: VITAMIN B 12: 383 pg/mL (ref 211–911)

## 2018-04-15 NOTE — Patient Instructions (Signed)
Ms. Capley , Thank you for taking time to come for your Medicare Wellness Visit. I appreciate your ongoing commitment to your health goals. Please review the following plan we discussed and let me know if I can assist you in the future.   These are the goals we discussed: Goals    . HEALTHY FOOD CHOICES     Starting 04/15/2018, I will continue to limit intake of fried foods to twice monthly.        This is a list of the screening recommended for you and due dates:  Health Maintenance  Topic Date Due  . Flu Shot  11/29/2018*  . Mammogram  04/01/2019*  . Colon Cancer Screening  06/27/2022  . Tetanus Vaccine  09/23/2026  .  Hepatitis C: One time screening is recommended by Center for Disease Control  (CDC) for  adults born from [birth year] through 1963.   Completed  . HIV Screening  Completed  . DTaP/Tdap/Td vaccine  Discontinued  *Topic was postponed. The date shown is not the original due date.   Preventive Care for Adults  A healthy lifestyle and preventive care can promote health and wellness. Preventive health guidelines for adults include the following key practices.  . A routine yearly physical is a good way to check with your health care provider about your health and preventive screening. It is a chance to share any concerns and updates on your health and to receive a thorough exam.  . Visit your dentist for a routine exam and preventive care every 6 months. Brush your teeth twice a day and floss once a day. Good oral hygiene prevents tooth decay and gum disease.  . The frequency of eye exams is based on your age, health, family medical history, use  of contact lenses, and other factors. Follow your health care provider's recommendations for frequency of eye exams.  . Eat a healthy diet. Foods like vegetables, fruits, whole grains, low-fat dairy products, and lean protein foods contain the nutrients you need without too many calories. Decrease your intake of foods high in solid fats,  added sugars, and salt. Eat the right amount of calories for you. Get information about a proper diet from your health care provider, if necessary.  . Regular physical exercise is one of the most important things you can do for your health. Most adults should get at least 150 minutes of moderate-intensity exercise (any activity that increases your heart rate and causes you to sweat) each week. In addition, most adults need muscle-strengthening exercises on 2 or more days a week.  Silver Sneakers may be a benefit available to you. To determine eligibility, you may visit the website: www.silversneakers.com or contact program at (724)507-6497 Mon-Fri between 8AM-8PM.   . Maintain a healthy weight. The body mass index (BMI) is a screening tool to identify possible weight problems. It provides an estimate of body fat based on height and weight. Your health care provider can find your BMI and can help you achieve or maintain a healthy weight.   For adults 20 years and older: ? A BMI below 18.5 is considered underweight. ? A BMI of 18.5 to 24.9 is normal. ? A BMI of 25 to 29.9 is considered overweight. ? A BMI of 30 and above is considered obese.   . Maintain normal blood lipids and cholesterol levels by exercising and minimizing your intake of saturated fat. Eat a balanced diet with plenty of fruit and vegetables. Blood tests for lipids and cholesterol should  begin at age 29 and be repeated every 5 years. If your lipid or cholesterol levels are high, you are over 50, or you are at high risk for heart disease, you may need your cholesterol levels checked more frequently. Ongoing high lipid and cholesterol levels should be treated with medicines if diet and exercise are not working.  . If you smoke, find out from your health care provider how to quit. If you do not use tobacco, please do not start.  . If you choose to drink alcohol, please do not consume more than 2 drinks per day. One drink is  considered to be 12 ounces (355 mL) of beer, 5 ounces (148 mL) of wine, or 1.5 ounces (44 mL) of liquor.  . If you are 16-11 years old, ask your health care provider if you should take aspirin to prevent strokes.  . Use sunscreen. Apply sunscreen liberally and repeatedly throughout the day. You should seek shade when your shadow is shorter than you. Protect yourself by wearing long sleeves, pants, a wide-brimmed hat, and sunglasses year round, whenever you are outdoors.  . Once a month, do a whole body skin exam, using a mirror to look at the skin on your back. Tell your health care provider of new moles, moles that have irregular borders, moles that are larger than a pencil eraser, or moles that have changed in shape or color.

## 2018-04-15 NOTE — Progress Notes (Signed)
PCP notes:   Health maintenance:  Flu vaccine - addressed Mammogram - addressed  Abnormal screenings:   None  Patient concerns:   None  Nurse concerns:  None  Next PCP appt:   04/20/18 @ 1130
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
## 2018-04-20 ENCOUNTER — Ambulatory Visit (INDEPENDENT_AMBULATORY_CARE_PROVIDER_SITE_OTHER): Payer: Medicare HMO | Admitting: Family Medicine

## 2018-04-20 ENCOUNTER — Encounter: Payer: Medicare HMO | Admitting: Family Medicine

## 2018-04-20 ENCOUNTER — Encounter: Payer: Self-pay | Admitting: Family Medicine

## 2018-04-20 VITALS — BP 118/70 | HR 79 | Temp 98.2°F | Ht 66.0 in | Wt 143.0 lb

## 2018-04-20 DIAGNOSIS — Z7189 Other specified counseling: Secondary | ICD-10-CM | POA: Diagnosis not present

## 2018-04-20 DIAGNOSIS — E538 Deficiency of other specified B group vitamins: Secondary | ICD-10-CM | POA: Diagnosis not present

## 2018-04-20 DIAGNOSIS — E785 Hyperlipidemia, unspecified: Secondary | ICD-10-CM

## 2018-04-20 DIAGNOSIS — Z0000 Encounter for general adult medical examination without abnormal findings: Secondary | ICD-10-CM

## 2018-04-20 DIAGNOSIS — Z87891 Personal history of nicotine dependence: Secondary | ICD-10-CM | POA: Diagnosis not present

## 2018-04-20 DIAGNOSIS — J439 Emphysema, unspecified: Secondary | ICD-10-CM

## 2018-04-20 DIAGNOSIS — S78112A Complete traumatic amputation at level between left hip and knee, initial encounter: Secondary | ICD-10-CM

## 2018-04-20 DIAGNOSIS — R3915 Urgency of urination: Secondary | ICD-10-CM | POA: Diagnosis not present

## 2018-04-20 DIAGNOSIS — M8580 Other specified disorders of bone density and structure, unspecified site: Secondary | ICD-10-CM | POA: Diagnosis not present

## 2018-04-20 DIAGNOSIS — Z89612 Acquired absence of left leg above knee: Secondary | ICD-10-CM

## 2018-04-20 MED ORDER — LORAZEPAM 0.5 MG PO TABS: ORAL_TABLET | ORAL | 1 refills | Status: DC

## 2018-04-20 NOTE — Patient Instructions (Addendum)
Call and schedule mammogram at your convenience.  May take ibuprofen '400mg'$  or aleve as needed for bladder symptoms - if persistent then come in for evaluation for UTI. If recurrent episodes, let us know for referral to urology.  Think about lung cancer screening program.  You are doing well today  Return as needed or in 1 year for next physical.  Health Maintenance, Female Adopting a healthy lifestyle and getting preventive care can go a long way to promote health and wellness. Talk with your health care provider about what schedule of regular examinations is right for you. This is a good chance for you to check in with your provider about disease prevention and staying healthy. In between checkups, there are plenty of things you can do on your own. Experts have done a lot of research about which lifestyle changes and preventive measures are most likely to keep you healthy. Ask your health care provider for more information. Weight and diet Eat a healthy diet  Be sure to include plenty of vegetables, fruits, low-fat dairy products, and lean protein.  Do not eat a lot of foods high in solid fats, added sugars, or salt.  Get regular exercise. This is one of the most important things you can do for your health. ? Most adults should exercise for at least 150 minutes each week. The exercise should increase your heart rate and make you sweat (moderate-intensity exercise). ? Most adults should also do strengthening exercises at least twice a week. This is in addition to the moderate-intensity exercise.  Maintain a healthy weight  Body mass index (BMI) is a measurement that can be used to identify possible weight problems. It estimates body fat based on height and weight. Your health care provider can help determine your BMI and help you achieve or maintain a healthy weight.  For females 53 years of age and older: ? A BMI below 18.5 is considered underweight. ? A BMI of 18.5 to 24.9 is normal. ? A  BMI of 25 to 29.9 is considered overweight. ? A BMI of 30 and above is considered obese.  Watch levels of cholesterol and blood lipids  You should start having your blood tested for lipids and cholesterol at 60 years of age, then have this test every 5 years.  You may need to have your cholesterol levels checked more often if: ? Your lipid or cholesterol levels are high. ? You are older than 60 years of age. ? You are at high risk for heart disease.  Cancer screening Lung Cancer  Lung cancer screening is recommended for adults 4-69 years old who are at high risk for lung cancer because of a history of smoking.  A yearly low-dose CT scan of the lungs is recommended for people who: ? Currently smoke. ? Have quit within the past 15 years. ? Have at least a 30-pack-year history of smoking. A pack year is smoking an average of one pack of cigarettes a day for 1 year.  Yearly screening should continue until it has been 15 years since you quit.  Yearly screening should stop if you develop a health problem that would prevent you from having lung cancer treatment.  Breast Cancer  Practice breast self-awareness. This means understanding how your breasts normally appear and feel.  It also means doing regular breast self-exams. Let your health care provider know about any changes, no matter how small.  If you are in your 20s or 30s, you should have a clinical breast  exam (CBE) by a health care provider every 1-3 years as part of a regular health exam.  If you are 20 or older, have a CBE every year. Also consider having a breast X-ray (mammogram) every year.  If you have a family history of breast cancer, talk to your health care provider about genetic screening.  If you are at high risk for breast cancer, talk to your health care provider about having an MRI and a mammogram every year.  Breast cancer gene (BRCA) assessment is recommended for women who have family members with  BRCA-related cancers. BRCA-related cancers include: ? Breast. ? Ovarian. ? Tubal. ? Peritoneal cancers.  Results of the assessment will determine the need for genetic counseling and BRCA1 and BRCA2 testing.  Cervical Cancer Your health care provider may recommend that you be screened regularly for cancer of the pelvic organs (ovaries, uterus, and vagina). This screening involves a pelvic examination, including checking for microscopic changes to the surface of your cervix (Pap test). You may be encouraged to have this screening done every 3 years, beginning at age 51.  For women ages 15-65, health care providers may recommend pelvic exams and Pap testing every 3 years, or they may recommend the Pap and pelvic exam, combined with testing for human papilloma virus (HPV), every 5 years. Some types of HPV increase your risk of cervical cancer. Testing for HPV may also be done on women of any age with unclear Pap test results.  Other health care providers may not recommend any screening for nonpregnant women who are considered low risk for pelvic cancer and who do not have symptoms. Ask your health care provider if a screening pelvic exam is right for you.  If you have had past treatment for cervical cancer or a condition that could lead to cancer, you need Pap tests and screening for cancer for at least 20 years after your treatment. If Pap tests have been discontinued, your risk factors (such as having a new sexual partner) need to be reassessed to determine if screening should resume. Some women have medical problems that increase the chance of getting cervical cancer. In these cases, your health care provider may recommend more frequent screening and Pap tests.  Colorectal Cancer  This type of cancer can be detected and often prevented.  Routine colorectal cancer screening usually begins at 60 years of age and continues through 60 years of age.  Your health care provider may recommend screening  at an earlier age if you have risk factors for colon cancer.  Your health care provider may also recommend using home test kits to check for hidden blood in the stool.  A small camera at the end of a tube can be used to examine your colon directly (sigmoidoscopy or colonoscopy). This is done to check for the earliest forms of colorectal cancer.  Routine screening usually begins at age 19.  Direct examination of the colon should be repeated every 5-10 years through 60 years of age. However, you may need to be screened more often if early forms of precancerous polyps or small growths are found.  Skin Cancer  Check your skin from head to toe regularly.  Tell your health care provider about any new moles or changes in moles, especially if there is a change in a mole's shape or color.  Also tell your health care provider if you have a mole that is larger than the size of a pencil eraser.  Always use sunscreen. Apply sunscreen  liberally and repeatedly throughout the day.  Protect yourself by wearing long sleeves, pants, a wide-brimmed hat, and sunglasses whenever you are outside.  Heart disease, diabetes, and high blood pressure  High blood pressure causes heart disease and increases the risk of stroke. High blood pressure is more likely to develop in: ? People who have blood pressure in the high end of the normal range (130-139/85-89 mm Hg). ? People who are overweight or obese. ? People who are African American.  If you are 25-56 years of age, have your blood pressure checked every 3-5 years. If you are 46 years of age or older, have your blood pressure checked every year. You should have your blood pressure measured twice-once when you are at a hospital or clinic, and once when you are not at a hospital or clinic. Record the average of the two measurements. To check your blood pressure when you are not at a hospital or clinic, you can use: ? An automated blood pressure machine at a  pharmacy. ? A home blood pressure monitor.  If you are between 74 years and 77 years old, ask your health care provider if you should take aspirin to prevent strokes.  Have regular diabetes screenings. This involves taking a blood sample to check your fasting blood sugar level. ? If you are at a normal weight and have a low risk for diabetes, have this test once every three years after 60 years of age. ? If you are overweight and have a high risk for diabetes, consider being tested at a younger age or more often. Preventing infection Hepatitis B  If you have a higher risk for hepatitis B, you should be screened for this virus. You are considered at high risk for hepatitis B if: ? You were born in a country where hepatitis B is common. Ask your health care provider which countries are considered high risk. ? Your parents were born in a high-risk country, and you have not been immunized against hepatitis B (hepatitis B vaccine). ? You have HIV or AIDS. ? You use needles to inject street drugs. ? You live with someone who has hepatitis B. ? You have had sex with someone who has hepatitis B. ? You get hemodialysis treatment. ? You take certain medicines for conditions, including cancer, organ transplantation, and autoimmune conditions.  Hepatitis C  Blood testing is recommended for: ? Everyone born from [birth year] through 1963. ? Anyone with known risk factors for hepatitis C.  Sexually transmitted infections (STIs)  You should be screened for sexually transmitted infections (STIs) including gonorrhea and chlamydia if: ? You are sexually active and are younger than 60 years of age. ? You are older than 60 years of age and your health care provider tells you that you are at risk for this type of infection. ? Your sexual activity has changed since you were last screened and you are at an increased risk for chlamydia or gonorrhea. Ask your health care provider if you are at risk.  If you do not  have HIV, but are at risk, it may be recommended that you take a prescription medicine daily to prevent HIV infection. This is called pre-exposure prophylaxis (PrEP). You are considered at risk if: ? You are sexually active and do not regularly use condoms or know the HIV status of your partner(s). ? You take drugs by injection. ? You are sexually active with a partner who has HIV.  Talk with your health care provider about  whether you are at high risk of being infected with HIV. If you choose to begin PrEP, you should first be tested for HIV. You should then be tested every 3 months for as long as you are taking PrEP. Pregnancy  If you are premenopausal and you may become pregnant, ask your health care provider about preconception counseling.  If you may become pregnant, take 400 to 800 micrograms (mcg) of folic acid every day.  If you want to prevent pregnancy, talk to your health care provider about birth control (contraception). Osteoporosis and menopause  Osteoporosis is a disease in which the bones lose minerals and strength with aging. This can result in serious bone fractures. Your risk for osteoporosis can be identified using a bone density scan.  If you are 51 years of age or older, or if you are at risk for osteoporosis and fractures, ask your health care provider if you should be screened.  Ask your health care provider whether you should take a calcium or vitamin D supplement to lower your risk for osteoporosis.  Menopause may have certain physical symptoms and risks.  Hormone replacement therapy may reduce some of these symptoms and risks. Talk to your health care provider about whether hormone replacement therapy is right for you. Follow these instructions at home:  Schedule regular health, dental, and eye exams.  Stay current with your immunizations.  Do not use any tobacco products including cigarettes, chewing tobacco, or electronic cigarettes.  If you are pregnant,  do not drink alcohol.  If you are breastfeeding, limit how much and how often you drink alcohol.  Limit alcohol intake to no more than 1 drink per day for nonpregnant women. One drink equals 12 ounces of beer, 5 ounces of wine, or 1 ounces of hard liquor.  Do not use street drugs.  Do not share needles.  Ask your health care provider for help if you need support or information about quitting drugs.  Tell your health care provider if you often feel depressed.  Tell your health care provider if you have ever been abused or do not feel safe at home. This information is not intended to replace advice given to you by your health care provider. Make sure you discuss any questions you have with your health care provider. Document Released: 03/02/2011 Document Revised: 01/23/2016 Document Reviewed: 05/21/2015 Elsevier Interactive Patient Education  [employer].

## 2018-04-20 NOTE — Assessment & Plan Note (Signed)
Chronic, not on meds. The 10-year ASCVD risk score Mikey Bussing DC Brooke Bonito., et al., 2013) is: 2.2%   Values used to calculate the score:     Age: 62 years     Sex: Female     Is Non-Hispanic African American: No     Diabetic: No     Tobacco smoker: No     Systolic Blood Pressure: 158 mmHg     Is BP treated: No     HDL Cholesterol: 74.5 mg/dL     Total Cholesterol: 217 mg/dL

## 2018-04-20 NOTE — Assessment & Plan Note (Signed)
Stable period off breathing medication.

## 2018-04-20 NOTE — Assessment & Plan Note (Signed)
Discussed lung cancer screening CT program - declines referral at this time.

## 2018-04-20 NOTE — Assessment & Plan Note (Signed)
Continue calcium and vitamin D daily.

## 2018-04-20 NOTE — Progress Notes (Addendum)
BP 118/70 (BP Location: Left Arm, Patient Position: Sitting, Cuff Size: Normal)   Pulse 79   Temp 98.2 F (36.8 C) (Oral)   Ht 5\' 6"  (1.676 m)   Wt 143 lb (64.9 kg)   SpO2 96%   BMI 23.08 kg/m    CC: CPE Subjective:    Patient ID: Rachel Mcintosh, female    DOB: [birth year], 62 y.o.   MRN: [id number]  HPI: Rachel Mcintosh is a 62 y.o. female presenting on 04/20/2018 for Annual Exam (Pt 2. )   Saw Katha Cabal last week for medicare wellness visit. Note reviewed.   Anxiety uses PRN lorazepam - a few times a week.   Ongoing episodes of UTI symptoms a few times a month - several cultures have been negative. Describes suprapubic pressure, urgency and dysuria worse in am that subsides over several hours. No hematuria. Has treated with azo pills. 3 clinic visits for this in the last 5 months. She did have positive UCx 07/2017 (proteus) treated with macrobid - symptoms have persisted since then.   Preventative: Colonoscopy 2013 [facility]NL [facility])  Mammogram Birads1 03/2017. due Well woman exam 07/2012 [facility]; Dr. Christophe Louis). No paps 2/2 h/o hysterectomy. Ovaries remain. has established with Dr Vickie Epley.  Lung cancer screening - eligible - will consider this, declines referral today Flu yearly  Pneumovax 05/2012  Tetanus 2008, 08/2016 Advanced directive - brought in today - will scan. Daughter Charlann Noss would be HCPOA. ok with life support/code, but doesn not want prolonged life support if terminal condition.  Seat belt use discussed Sunscreen use discussed. No changing moles.  Ex smoker - quit 2016. Smoked 40 years 1 ppd.  Alcohol - seldom  Dentist Q6 mo  Eye exam - yearly  Lives alone Edu: HS Occupation: disability since [place] 2008, was supervisor at [employer] Activity: no regular exercise  Diet: some water, fruits daily   Relevant past medical, surgical, family and social history reviewed and updated as indicated. Interim medical history since our last visit reviewed. Allergies  and medications reviewed and updated. Outpatient Medications Prior to Visit  Medication Sig Dispense Refill  . Calcium Carbonate-Vitamin D3 (CALCIUM 600/VITAMIN D) 600-400 MG-UNIT TABS Take 1 tablet by mouth daily.    . cyanocobalamin (V-R VITAMIN B-12) 500 MCG tablet Take 1 tablet (500 mcg total) by mouth daily. (Patient taking differently: Take 500 mcg by mouth 3 (three) times a week. )    . fluticasone (FLONASE) 50 MCG/ACT nasal spray Place 2 sprays into both nostrils daily. (Patient taking differently: Place 2 sprays into both nostrils as needed. ) 16 g 6  . Naproxen Sodium (ALEVE PO) Take 1 tablet by mouth as needed.    Marland Kitchen LORazepam (ATIVAN) 0.5 MG tablet TAKE 1/2 TO 1 TABLET BY MOUTH TWICE A DAY AS NEEDED AS DIRECTED. FOR ANXIETY 20 tablet 1   No facility-administered medications prior to visit.      Per HPI unless specifically indicated in ROS section below Review of Systems  Constitutional: Negative for activity change, appetite change, chills, fatigue, fever and unexpected weight change.  HENT: Positive for rhinorrhea. Negative for hearing loss.   Eyes: Negative for visual disturbance.  Respiratory: Negative for cough, chest tightness, shortness of breath and wheezing.   Cardiovascular: Negative for chest pain, palpitations and leg swelling.  Gastrointestinal: Negative for abdominal distention, abdominal pain, blood in stool, constipation, diarrhea, nausea and vomiting.  Genitourinary: Negative for difficulty urinating and hematuria.  Musculoskeletal: Negative for arthralgias, myalgias  and neck pain.  Skin: Negative for rash.  Neurological: Negative for dizziness, seizures, syncope and headaches.  Hematological: Negative for adenopathy. Does not bruise/bleed easily.  Psychiatric/Behavioral: Negative for dysphoric mood. The patient is not nervous/anxious.        Objective:    BP 118/70 (BP Location: Left Arm, Patient Position: Sitting, Cuff Size: Normal)   Pulse 79   Temp 98.2  F (36.8 C) (Oral)   Ht 5\' 6"  (1.676 m)   Wt 143 lb (64.9 kg)   SpO2 96%   BMI 23.08 kg/m   Wt Readings from Last 3 Encounters:  04/20/18 143 lb (64.9 kg)  04/15/18 145 lb 4 oz (65.9 kg)  02/21/18 144 lb 1.9 oz (65.4 kg)    Physical Exam  Constitutional: She is oriented to person, place, and time. She appears well-developed and well-nourished. No distress.  HENT:  Head: Normocephalic and atraumatic.  Right Ear: Hearing, tympanic membrane, external ear and ear canal normal.  Left Ear: Hearing, tympanic membrane, external ear and ear canal normal.  Nose: Nose normal.  Mouth/Throat: Uvula is midline, oropharynx is clear and moist and mucous membranes are normal. No oropharyngeal exudate, posterior oropharyngeal edema or posterior oropharyngeal erythema.  Eyes: Pupils are equal, round, and reactive to light. Conjunctivae and EOM are normal. No scleral icterus.  Neck: Normal range of motion. Neck supple. No thyromegaly (mild fullness L thyroid) present.  Cardiovascular: Normal rate, regular rhythm, normal heart sounds and intact distal pulses.  No murmur heard. Pulses:      Radial pulses are 2+ on the right side, and 2+ on the left side.  Pulmonary/Chest: Effort normal and breath sounds normal. No respiratory distress. She has no wheezes. She has no rales.  Abdominal: Soft. Bowel sounds are normal. She exhibits no distension and no mass. There is no tenderness. There is no rebound and no guarding.  Musculoskeletal: Normal range of motion. She exhibits no edema.  Lymphadenopathy:    She has no cervical adenopathy.  Neurological: She is alert and oriented to person, place, and time.  CN grossly intact, station and gait intact  Skin: Skin is warm and dry. No rash noted.  Psychiatric: She has a normal mood and affect. Her behavior is normal. Judgment and thought content normal.  Nursing note and vitals reviewed.  Results for orders placed or performed in visit on 04/15/18  CBC with  Differential/Platelet  Result Value Ref Range   WBC 5.3 4.0 - 10.5 K/uL   RBC 4.86 3.87 - 5.11 Mil/uL   Hemoglobin 14.1 12.0 - 15.0 g/dL   HCT 40.9 36.0 - 46.0 %   MCV 84.1 78.0 - 100.0 fl   MCHC 34.5 30.0 - 36.0 g/dL   RDW 13.3 11.5 - 15.5 %   Platelets 316.0 150.0 - 400.0 K/uL   Neutrophils Relative % 59.4 43.0 - 77.0 %   Lymphocytes Relative 30.2 12.0 - 46.0 %   Monocytes Relative 6.3 3.0 - 12.0 %   Eosinophils Relative 2.2 0.0 - 5.0 %   Basophils Relative 1.9 0.0 - 3.0 %   Neutro Abs 3.1 1.4 - 7.7 K/uL   Lymphs Abs 1.6 0.7 - 4.0 K/uL   Monocytes Absolute 0.3 0.1 - 1.0 K/uL   Eosinophils Absolute 0.1 0.0 - 0.7 K/uL   Basophils Absolute 0.1 0.0 - 0.1 K/uL  Vitamin B12  Result Value Ref Range   Vitamin B-12 383 211 - 911 pg/mL  Comprehensive metabolic panel  Result Value Ref Range  Sodium 142 135 - 145 mEq/L   Potassium 4.0 3.5 - 5.1 mEq/L   Chloride 104 96 - 112 mEq/L   CO2 31 19 - 32 mEq/L   Glucose, Bld 99 70 - 99 mg/dL   BUN 9 6 - 23 mg/dL   Creatinine, Ser 0.66 0.40 - 1.20 mg/dL   Total Bilirubin 0.5 0.2 - 1.2 mg/dL   Alkaline Phosphatase 55 39 - 117 U/L   AST 14 0 - 37 U/L   ALT 11 0 - 35 U/L   Total Protein 7.3 6.0 - 8.3 g/dL   Albumin 4.4 3.5 - 5.2 g/dL   Calcium 9.9 8.4 - 10.5 mg/dL   GFR 97.17 >60.00 mL/min  Lipid panel  Result Value Ref Range   Cholesterol 217 (H) 0 - 200 mg/dL   Triglycerides 65.0 0.0 - 149.0 mg/dL   HDL 74.50 >39.00 mg/dL   VLDL 13.0 0.0 - 40.0 mg/dL   LDL Cholesterol 129 (H) 0 - 99 mg/dL   Total CHOL/HDL Ratio 3    NonHDL 142.32       Assessment & Plan:   Problem List Items Addressed This Visit    Vitamin B12 deficiency    Low normal. Continue b12 replacement.       Urinary urgency    Describes UTI sxs with normal cultures x2 in the past few months. Discussed possible IC - rec NSAID if symptoms develop, and if ongoing past 1 day then come in for UTI evaluation. If recurrent episodes, discussed urology referra.        Osteopenia    Continue calcium and vitamin D daily.       Hyperlipidemia    Chronic, not on meds. The 10-year ASCVD risk score Mikey Bussing DC Brooke Bonito., et al., 2013) is: 2.2%   Values used to calculate the score:     Age: 89 years     Sex: Female     Is Non-Hispanic African American: No     Diabetic: No     Tobacco smoker: No     Systolic Blood Pressure: 621 mmHg     Is BP treated: No     HDL Cholesterol: 74.5 mg/dL     Total Cholesterol: 217 mg/dL       Health maintenance examination - Primary    Preventative protocols reviewed and updated unless pt declined. Discussed healthy diet and lifestyle.       Ex-smoker    Discussed lung cancer screening CT program - declines referral at this time.      Emphysema/COPD ([place])    Stable period off breathing medication.      Advanced care planning/counseling discussion    Advanced directive - brought in today - will scan. Daughter Charlann Noss would be HCPOA. ok with life support/code, but doesn not want prolonged life support if terminal condition.       Above knee amputation of left lower extremity ([place])       Meds ordered this encounter  Medications  . LORazepam (ATIVAN) 0.5 MG tablet    Sig: TAKE 1/2 TO 1 TABLET BY MOUTH TWICE A DAY AS NEEDED AS DIRECTED. FOR ANXIETY    Dispense:  20 tablet    Refill:  1    Not to exceed 4 additional fills before 05/01/2018   No orders of the defined types were placed in this encounter.   Follow up plan: Return in about 1 year (around 04/21/2019) for annual exam, prior fasting for blood work, medicare wellness visit.  Ria Bush, MD

## 2018-04-20 NOTE — Assessment & Plan Note (Signed)
Preventative protocols reviewed and updated unless pt declined. Discussed healthy diet and lifestyle.  

## 2018-04-20 NOTE — Assessment & Plan Note (Signed)
Low normal. Continue b12 replacement.

## 2018-04-20 NOTE — Progress Notes (Signed)
I reviewed health advisor's note, was available for consultation, and agree with documentation and plan.  

## 2018-04-20 NOTE — Assessment & Plan Note (Signed)
Advanced directive - brought in today - will scan. Daughter Charlann Noss would be HCPOA. ok with life support/code, but doesn not want prolonged life support if terminal condition.

## 2018-04-20 NOTE — Assessment & Plan Note (Signed)
Describes UTI sxs with normal cultures x2 in the past few months. Discussed possible IC - rec NSAID if symptoms develop, and if ongoing past 1 day then come in for UTI evaluation. If recurrent episodes, discussed urology referra.

## 2018-04-21 ENCOUNTER — Other Ambulatory Visit: Payer: Self-pay | Admitting: Family Medicine

## 2018-04-21 DIAGNOSIS — Z1231 Encounter for screening mammogram for malignant neoplasm of breast: Secondary | ICD-10-CM

## 2018-05-03 ENCOUNTER — Encounter: Payer: Self-pay | Admitting: Family Medicine

## 2018-05-03 ENCOUNTER — Telehealth: Payer: Self-pay

## 2018-05-03 ENCOUNTER — Ambulatory Visit (INDEPENDENT_AMBULATORY_CARE_PROVIDER_SITE_OTHER): Payer: Medicare HMO | Admitting: Family Medicine

## 2018-05-03 VITALS — BP 122/80 | HR 70 | Temp 98.4°F | Ht 66.0 in | Wt 143.5 lb

## 2018-05-03 DIAGNOSIS — R300 Dysuria: Secondary | ICD-10-CM | POA: Diagnosis not present

## 2018-05-03 DIAGNOSIS — R3915 Urgency of urination: Secondary | ICD-10-CM

## 2018-05-03 DIAGNOSIS — N3020 Other chronic cystitis without hematuria: Secondary | ICD-10-CM | POA: Insufficient documentation

## 2018-05-03 LAB — POC URINALSYSI DIPSTICK (AUTOMATED)
Bilirubin, UA: NEGATIVE
Blood, UA: NEGATIVE
Glucose, UA: NEGATIVE
Ketones, UA: NEGATIVE
LEUKOCYTES UA: NEGATIVE
NITRITE UA: NEGATIVE
PROTEIN UA: NEGATIVE
SPEC GRAV UA: 1.01 (ref 1.010–1.025)
UROBILINOGEN UA: 0.2 U/dL
pH, UA: 6 (ref 5.0–8.0)

## 2018-05-03 MED ORDER — PHENAZOPYRIDINE HCL 100 MG PO TABS: 100.0000 mg | ORAL_TABLET | Freq: Three times a day (TID) | ORAL | 1 refills | Status: DC | PRN

## 2018-05-03 NOTE — Progress Notes (Signed)
BP 122/80 (BP Location: Left Arm, Patient Position: Sitting, Cuff Size: Normal)   Pulse 70   Temp 98.4 F (36.9 C) (Oral)   Ht 5\' 6"  (1.676 m)   Wt 143 lb 8 oz (65.1 kg)   SpO2 90%   BMI 23.16 kg/m    CC: ?UTI Subjective:    Patient ID: Rachel Mcintosh, female    DOB: [birth year], 62 y.o.   MRN: [id number]  HPI: [name] is a 62 y.o. female presenting on 05/03/2018 for Dysuria (C/o pain and burning with urination. Also, c/o of lower abd pain/pressure. Tried AZO. )   3d h/o dysuria, urgency, frequency, lower abd pain and pressure. Azo and aleve tried x2, help some. Some nausea in the mornings. No blood in urine, fevers. No flank pain.   Ongoing UTI symptoms a few times a month - several cultures have been negative. Describes suprapubic pressure, urgency and dysuria worse in am that subsides over several hours. No hematuria. Has treated with azo pills. 4 clinic visits for this in the last 6 months. She did have positive UCx 07/2017 ([facility]us) treated with macrobid - symptoms have persisted since then. no further positive UCx that I can see.   Relevant past medical, surgical, family and social history reviewed and updated as indicated. Interim medical history since our last visit reviewed. Allergies and medications reviewed and updated. Outpatient Medications Prior to Visit  Medication Sig Dispense Refill  . Calcium Carbonate-Vitamin D3 (CALCIUM 600/VITAMIN D) 600-400 MG-UNIT TABS Take 1 tablet by mouth daily.    . cyanocobalamin (V-R VITAMIN B-12) 500 MCG tablet Take 1 tablet (500 mcg total) by mouth daily. (Patient taking differently: Take 500 mcg by mouth 3 (three) times a week. )    . fluticasone (FLONASE) 50 MCG/ACT nasal spray Place 2 sprays into both nostrils daily. (Patient taking differently: Place 2 sprays into both nostrils as needed. ) 16 g 6  . LORazepam (ATIVAN) 0.5 MG tablet TAKE 1/2 TO 1 TABLET BY MOUTH TWICE A DAY AS NEEDED AS DIRECTED. FOR ANXIETY 20 tablet 1  .  Naproxen Sodium (ALEVE PO) Take 1 tablet by mouth as needed.     No facility-administered medications prior to visit.      Per HPI unless specifically indicated in ROS section below Review of Systems     Objective:    BP 122/80 (BP Location: Left Arm, Patient Position: Sitting, Cuff Size: Normal)   Pulse 70   Temp 98.4 F (36.9 C) (Oral)   Ht 5\' 6"  (1.676 m)   Wt 143 lb 8 oz (65.1 kg)   SpO2 90%   BMI 23.16 kg/m   Wt Readings from Last 3 Encounters:  05/03/18 143 lb 8 oz (65.1 kg)  04/20/18 143 lb (64.9 kg)  04/15/18 145 lb 4 oz (65.9 kg)    Physical Exam  Constitutional: She appears well-developed and well-nourished. No distress.  Abdominal: Soft. Bowel sounds are normal. She exhibits no distension and no mass. There is no hepatosplenomegaly. There is tenderness (mild) in the epigastric area and suprapubic area. There is no rebound, no guarding, no CVA tenderness and negative Murphy's sign. No hernia.  Nursing note and vitals reviewed.  Results for orders placed or performed in visit on 05/03/18  POCT Urinalysis Dipstick (Automated)  Result Value Ref Range   Color, UA yellow    Clarity, UA clear    Glucose, UA Negative Negative   Bilirubin, UA negative    Ketones, UA negative  Spec Grav, UA 1.010 1.010 - 1.025   Blood, UA negative    pH, UA 6.0 5.0 - 8.0   Protein, UA Negative Negative   Urobilinogen, UA 0.2 0.2 or 1.0 E.U./dL   Nitrite, UA negative    Leukocytes, UA Negative Negative   Lab Results  Component Value Date   CREATININE 0.66 04/15/2018   BUN 9 04/15/2018   NA 142 04/15/2018   K 4.0 04/15/2018   CL 104 04/15/2018   CO2 31 04/15/2018       Assessment & Plan:   Problem List Items Addressed This Visit    Urinary urgency   Dysuria - Primary    Recurrent UTI symptoms but with negative urine cultures. Discussed suspected interstitial cystitis, handout provided. rec Azo PRN, as well as alleve 220-440mg  BID with meals for 3-5 days each time she has  symptoms. If ongoing, does need to come in for evaluation. Will refer to urology for further evaluation. Pt agrees with plan.       Relevant Orders   POCT Urinalysis Dipstick (Automated) (Completed)   Urine Culture   Ambulatory referral to Urology       Meds ordered this encounter  Medications  . phenazopyridine (PYRIDIUM) 100 MG tablet    Sig: Take 1 tablet (100 mg total) by mouth 3 (three) times daily as needed for pain.    Dispense:  20 tablet    Refill:  1   Orders Placed This Encounter  Procedures  . Urine Culture  . Ambulatory referral to Urology    Referral Priority:   Routine    Referral Type:   Consultation    Referral Reason:   Specialty Services Required    Requested Specialty:   Urology    Number of Visits Requested:   1  . POCT Urinalysis Dipstick (Automated)    Follow up plan: Return if symptoms worsen or fail to improve.  Ria Bush, MD

## 2018-05-03 NOTE — Telephone Encounter (Signed)
Pt already has appt with Dr Darnell Level 05/03/18 at 10:15.

## 2018-05-03 NOTE — Assessment & Plan Note (Deleted)
Recurrent UTI symptoms but with negative urine culture. Discussed suspected interstitial cystitis, handout provided. rec Azo PRN, as well as alleve 220-440mg  BID with meals for 3-5 days each time she has symptoms. If ongoing, does need to come in for evaluation. Will refer to urology for further evaluation. Pt agrees with plan.

## 2018-05-03 NOTE — Patient Instructions (Addendum)
I am suspicious for interstitial cystitis.  Urine culture sent.  Treat with continued azo for 3 days as well as aleve twice daily with meals for 5 days.  We will refer you to urology for further evaluation.   Interstitial Cystitis Interstitial cystitis is a condition that causes inflammation of the bladder. The bladder is a hollow organ in the lower part of your abdomen. It stores urine after the urine is made by your kidneys. With interstitial cystitis, you may have pain in the bladder area. You may also have a frequent and urgent need to urinate. The severity of interstitial cystitis can vary from person to person. You may have flare-ups of the condition, and then it may go away for a while. For many people who have this condition, it becomes a long-term problem. What are the causes? The cause of this condition is not known. What increases the risk? This condition is more likely to develop in women. What are the signs or symptoms? Symptoms of interstitial cystitis vary, and they can change over time. Symptoms may include:  Discomfort or pain in the bladder area. This can range from mild to severe. The pain may change in intensity as the bladder fills with urine or as it empties.  Pelvic pain.  An urgent need to urinate.  Frequent urination.  Pain during sexual intercourse.  Pinpoint bleeding on the bladder wall.  For women, the symptoms often get worse during menstruation. How is this diagnosed? This condition is diagnosed by evaluating your symptoms and ruling out other causes. A physical exam will be done. Various tests may be done to rule out other conditions. Common tests include:  Urine tests.  Cystoscopy. In this test, a tool that is like a very thin telescope is used to look into your bladder.  Biopsy. This involves taking a sample of tissue from the bladder wall to be examined under a microscope.  How is this treated? There is no cure for interstitial cystitis, but  treatment methods are available to control your symptoms. Work closely with your health care provider to find the treatments that will be most effective for you. Treatment options may include:  Medicines to relieve pain and to help reduce the number of times that you feel the need to urinate.  Bladder training. This involves learning ways to control when you urinate, such as: ? Urinating at scheduled times. ? Training yourself to delay urination. ? Doing exercises (Kegel exercises) to strengthen the muscles that control urine flow.  Lifestyle changes, such as changing your diet or taking steps to control stress.  Use of a device that provides electrical stimulation in order to reduce pain.  A procedure that stretches your bladder by filling it with air or fluid.  Surgery. This is rare. It is only done for extreme cases if other treatments do not help.  Follow these instructions at home:  Take medicines only as directed by your health care provider.  Use bladder training techniques as directed. ? Keep a bladder diary to find out which foods, liquids, or activities make your symptoms worse. ? Use your bladder diary to schedule bathroom trips. If you are away from home, plan to be near a bathroom at each of your scheduled times. ? Make sure you urinate just before you leave the house and just before you go to bed.  Do Kegel exercises as directed by your health care provider.  Do not drink alcohol.  Do not use any tobacco products, including  cigarettes, chewing tobacco, or electronic cigarettes. If you need help quitting, ask your health care provider.  Make dietary changes as directed by your health care provider. You may need to avoid spicy foods and foods that contain a high amount of potassium.  Limit your drinking of beverages that stimulate urination. These include soda, coffee, and tea.  Keep all follow-up visits as directed by your health care provider. This is  important. Contact a health care provider if:  Your symptoms do not get better after treatment.  Your pain and discomfort are getting worse.  You have more frequent urges to urinate.  You have a fever. Get help right away if:  You are not able to control your bladder at all. This information is not intended to replace advice given to you by your health care provider. Make sure you discuss any questions you have with your health care provider. Document Released: 04/17/2004 Document Revised: 01/23/2016 Document Reviewed: 04/24/2014 Elsevier Interactive Patient Education  [employer].

## 2018-05-03 NOTE — Assessment & Plan Note (Addendum)
Recurrent UTI symptoms but with negative urine cultures. Discussed suspected interstitial cystitis, handout provided. rec Azo PRN, as well as alleve 220-440mg  BID with meals for 3-5 days each time she has symptoms. If ongoing, does need to come in for evaluation. Will refer to urology for further evaluation. Pt agrees with plan.

## 2018-05-03 NOTE — Telephone Encounter (Signed)
PLEASE NOTE: All timestamps contained within this report are represented as [place] Standard Time. CONFIDENTIALTY NOTICE: This fax transmission is intended only for the addressee. It contains information that is legally privileged, confidential or otherwise protected from use or disclosure. If you are not the intended recipient, you are strictly prohibited from reviewing, disclosing, copying using or disseminating any of this information or taking any action in reliance on or regarding this information. If you have received this fax in error, please notify us immediately by telephone so that we can arrange for its return to [place]. Phone: [phone], Toll-Free: [phone], Fax: [phone] Page: 1 of 2 Call Id: [id number] [place] Patient Name: Rachel Mcintosh Gender: Female DOB: [birth year] Age: 62 Y 9 M 27 D Return Phone Number: [phone] (Primary) Address: City/State/Zip: [place] [address] Client [place] Night - Client Client Site [place] Physician Ria Bush - MD Contact Type Call Who Is Calling Patient / Member / Family / Caregiver Call Type Triage / Clinical Relationship To Patient Self Return Phone Number [phone] (Primary) Chief Complaint Urination Frequency Reason for Call Symptomatic / Request for [place] states urinary frequency and urination pain. Translation No Nurse Assessment Nurse: Gareth Eagle, RN, Raquel Sarna Date/Time Eilene Ghazi Time): 05/03/2018 7:01:34 AM Confirm and document reason for call. If symptomatic, describe symptoms. ---Caller states she has been having urinary frequency and burning since Sunday. No blood in urine, no fever. Went online to schedule appt but could not get appt until tomorrow. Wants to be seen today if possible. Does the patient have any new or worsening  symptoms? ---Yes Will a triage be completed? ---Yes Related visit to physician within the last 2 weeks? ---No Does the PT have any chronic conditions? (i.e. diabetes, asthma, this includes High risk factors for pregnancy, etc.) ---No Is this a behavioral health or substance abuse call? ---No Guidelines Guideline Title Affirmed Question Affirmed Notes Nurse Date/Time (Eastern Time) Urinary Symptoms [1] Can't control passage of urine (i.e., urinary incontinence) AND [2] new onset (< 2 weeks) or worsening Theodoro Grist 05/03/2018 7:03:29 AM Disp. Time Eilene Ghazi Time) Disposition Final User 05/03/2018 7:06:37 AM See PCP within 24 Hours Yes Gareth Eagle, RN, Judeth Horn Disagree/Comply Comply Caller Understands Yes PLEASE NOTE: All timestamps contained within this report are represented as [place] Standard Time. CONFIDENTIALTY NOTICE: This fax transmission is intended only for the addressee. It contains information that is legally privileged, confidential or otherwise protected from use or disclosure. If you are not the intended recipient, you are strictly prohibited from reviewing, disclosing, copying using or disseminating any of this information or taking any action in reliance on or regarding this information. If you have received this fax in error, please notify us immediately by telephone so that we can arrange for its return to [place]. Phone: [phone], Toll-Free: [phone], Fax: [phone] Page: 2 of 2 Call Id: [id number] PreDisposition Did not know what to do Care Advice Given Per Guideline SEE PCP WITHIN 24 HOURS: * IF OFFICE WILL BE OPEN: You need to be seen within the next 24 hours. Call your doctor (or NP/PA) when the office opens and make an appointment. CALL BACK IF: * You become worse. CARE ADVICE given per Urinary Symptoms (Adult) guideline. Referrals REFERRED TO PCP OFFICE

## 2018-05-04 ENCOUNTER — Ambulatory Visit: Payer: Medicare HMO | Admitting: Family Medicine

## 2018-05-04 LAB — URINE CULTURE
MICRO NUMBER: 91049328
Result:: NO GROWTH
SPECIMEN QUALITY: ADEQUATE

## 2018-05-23 ENCOUNTER — Encounter: Payer: Self-pay | Admitting: Urology

## 2018-05-23 ENCOUNTER — Other Ambulatory Visit: Payer: Self-pay

## 2018-05-23 ENCOUNTER — Ambulatory Visit: Payer: Self-pay | Admitting: Urology

## 2018-05-23 ENCOUNTER — Ambulatory Visit (INDEPENDENT_AMBULATORY_CARE_PROVIDER_SITE_OTHER): Payer: Medicare HMO | Admitting: Urology

## 2018-05-23 VITALS — BP 115/75 | HR 72 | Ht 67.0 in | Wt 143.0 lb

## 2018-05-23 DIAGNOSIS — R300 Dysuria: Secondary | ICD-10-CM
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
## 2018-05-24 LAB — URINALYSIS, COMPLETE
BILIRUBIN UA: NEGATIVE
GLUCOSE, UA: NEGATIVE
KETONES UA: NEGATIVE
Leukocytes, UA: NEGATIVE
Nitrite, UA: NEGATIVE
Protein, UA: NEGATIVE
RBC UA: NEGATIVE
SPEC GRAV UA: 1.01 (ref 1.005–1.030)
UUROB: 0.2 mg/dL (ref 0.2–1.0)
pH, UA: 6 (ref 5.0–7.5)

## 2018-05-24 LAB — MICROSCOPIC EXAMINATION
Bacteria, UA: NONE SEEN
RBC, UA: NONE SEEN /hpf (ref 0–2)

## 2018-05-27 ENCOUNTER — Ambulatory Visit
Admission: RE | Admit: 2018-05-27 | Discharge: 2018-05-27 | Disposition: A | Discharge status: Home / Self Care | Payer: Medicare HMO | Source: Ambulatory Visit | Attending: Family Medicine | Admitting: Family Medicine

## 2018-05-27 DIAGNOSIS — Z1231 Encounter for screening mammogram for malignant neoplasm of breast: Secondary | ICD-10-CM | POA: Diagnosis not present

## 2018-05-30 ENCOUNTER — Other Ambulatory Visit: Payer: Self-pay | Admitting: Family Medicine

## 2018-05-30 DIAGNOSIS — R928 Other abnormal and inconclusive findings on diagnostic imaging of breast: Secondary | ICD-10-CM

## 2018-06-02 ENCOUNTER — Ambulatory Visit
Admission: RE | Admit: 2018-06-02 | Discharge: 2018-06-02 | Disposition: A | Discharge status: Home / Self Care | Payer: Medicare HMO | Source: Ambulatory Visit | Attending: Family Medicine | Admitting: Family Medicine

## 2018-06-02 ENCOUNTER — Other Ambulatory Visit: Payer: Self-pay | Admitting: Family Medicine

## 2018-06-02 DIAGNOSIS — R928 Other abnormal and inconclusive findings on diagnostic imaging of breast: Secondary | ICD-10-CM

## 2018-06-02 DIAGNOSIS — R921 Mammographic calcification found on diagnostic imaging of breast: Secondary | ICD-10-CM

## 2018-06-10 ENCOUNTER — Encounter: Payer: Self-pay | Admitting: Urology

## 2018-06-10 ENCOUNTER — Ambulatory Visit (INDEPENDENT_AMBULATORY_CARE_PROVIDER_SITE_OTHER): Payer: Medicare HMO | Admitting: Urology

## 2018-06-10 VITALS — BP 118/77 | HR 78 | Resp 16 | Ht 66.0 in | Wt 143.0 lb

## 2018-06-10 DIAGNOSIS — R300 Dysuria: Secondary | ICD-10-CM

## 2018-06-10 LAB — URINALYSIS, COMPLETE
Bilirubin, UA: NEGATIVE
Glucose, UA: NEGATIVE
KETONES UA: NEGATIVE
Leukocytes, UA: NEGATIVE
Nitrite, UA: NEGATIVE
Protein, UA: NEGATIVE
SPEC GRAV UA: 1.01 (ref 1.005–1.030)
Urobilinogen, Ur: 0.2 mg/dL (ref 0.2–1.0)
pH, UA: 6 (ref 5.0–7.5)

## 2018-06-10 NOTE — Progress Notes (Signed)
Cystoscopy Procedure Note:  Indication: Dysuria, r/o CIS  After informed consent and discussion of the procedure and its risks, Rachel Mcintosh was positioned and prepped in the standard fashion. Cystoscopy was performed with a flexible cystoscope. The urethra, bladder neck and entire bladder was visualized in a standard fashion. The bladder was grossly normal. The ureteral orifices were visualized in their normal location and orientation.   Findings: Normal cystoscopy  Assessment and Plan: Follow up as needed Consider Pelvic floor PT if new or worsening urinary symptoms  Nickolas Madrid, MD 06/10/2018

## 2018-06-13 DIAGNOSIS — Z89612 Acquired absence of left leg above knee: Secondary | ICD-10-CM | POA: Diagnosis not present

## 2018-06-13 DIAGNOSIS — Z882 Allergy status to sulfonamides status: Secondary | ICD-10-CM | POA: Diagnosis not present

## 2018-06-13 DIAGNOSIS — Z8249 Family history of ischemic heart disease and other diseases of the circulatory system: Secondary | ICD-10-CM | POA: Diagnosis not present

## 2018-06-13 DIAGNOSIS — Z9710 Presence of artificial limb (complete) (partial), unspecified: Secondary | ICD-10-CM | POA: Diagnosis not present

## 2018-06-13 DIAGNOSIS — Z809 Family history of malignant neoplasm, unspecified: Secondary | ICD-10-CM | POA: Diagnosis not present

## 2018-06-13 DIAGNOSIS — Z87891 Personal history of nicotine dependence: Secondary | ICD-10-CM | POA: Diagnosis not present

## 2018-06-13 DIAGNOSIS — Z885 Allergy status to narcotic agent status: Secondary | ICD-10-CM | POA: Diagnosis not present

## 2018-06-15 ENCOUNTER — Other Ambulatory Visit: Payer: Self-pay | Admitting: Urology

## 2018-06-30 ENCOUNTER — Other Ambulatory Visit: Payer: Self-pay | Admitting: Family Medicine

## 2018-07-01 NOTE — Telephone Encounter (Signed)
Eprescribed.

## 2018-07-19 ENCOUNTER — Encounter: Payer: Self-pay | Admitting: Family Medicine

## 2018-07-19 ENCOUNTER — Ambulatory Visit (INDEPENDENT_AMBULATORY_CARE_PROVIDER_SITE_OTHER): Payer: Medicare HMO | Admitting: Family Medicine

## 2018-07-19 VITALS — BP 120/66 | HR 72 | Temp 98.2°F | Ht 66.0 in | Wt 143.5 lb

## 2018-07-19 DIAGNOSIS — J069 Acute upper respiratory infection, unspecified: Secondary | ICD-10-CM

## 2018-07-19 NOTE — Assessment & Plan Note (Signed)
Anticipate viral given short duration. Supportive care reviewed. Red flags to seek further care reviewed. Pt agrees with plan.

## 2018-07-19 NOTE — Progress Notes (Signed)
BP 120/66 (BP Location: Left Arm, Patient Position: Sitting, Cuff Size: Normal)   Pulse 72   Temp 98.2 F (36.8 C) (Oral)   Ht 5\' 6"  (1.676 m)   Wt 143 lb 8 oz (65.1 kg)   SpO2 98%   BMI 23.16 kg/m    CC: URI  Subjective:    Patient ID: Rachel Mcintosh, female    DOB: 03-Jul-1956, 62 y.o.   MRN: [id number]  HPI: Rachel Mcintosh is a 62 y.o. female presenting on 07/19/2018 for Headache (C/o HA, nasal congestion, nasal drainage, cough and chest congestion. Sxs started 1 wk ago. Tried Mucinex. Sxs improved so stopped Mucinex and sxs worsened again. )   1 wk h/o HA, body aches, sneezing, non productive cough. Some chest heaviness. Some coughing fits. Head > chest congestion. Sleeping ok at night time. Some PNdrainage and dyspnea.  No fevers/chills, ear or tooth pain, ST or wheezing.   Treating with mucinex and flonase with benefit then symptoms worsened. Has been using intermittent aleve.  No sick contacts at home. No smokers at home. No h/o asthma.   Relevant past medical, surgical, family and social history reviewed and updated as indicated. Interim medical history since our last visit reviewed. Allergies and medications reviewed and updated. Outpatient Medications Prior to Visit  Medication Sig Dispense Refill  . Calcium Carbonate-Vitamin D3 (CALCIUM 600/VITAMIN D) 600-400 MG-UNIT TABS Take 1 tablet by mouth daily.    . cyanocobalamin (V-R VITAMIN B-12) 500 MCG tablet Take 1 tablet (500 mcg total) by mouth daily. (Patient taking differently: Take 500 mcg by mouth 3 (three) times a week. )    . fluticasone (FLONASE) 50 MCG/ACT nasal spray Place 2 sprays into both nostrils daily. (Patient taking differently: Place 2 sprays into both nostrils as needed. ) 16 g 6  . LORazepam (ATIVAN) 0.5 MG tablet TAKE 1/2 TO 1 TABLET BY MOUTH TWICE A DAY AS NEEDED AS DIRECTED. FOR ANXIETY 20 tablet 1  . Naproxen Sodium (ALEVE PO) Take 1 tablet by mouth as needed.    . phenazopyridine (PYRIDIUM) 100  MG tablet Take 1 tablet (100 mg total) by mouth 3 (three) times daily as needed for pain. 20 tablet 1   No facility-administered medications prior to visit.      Per HPI unless specifically indicated in ROS section below Review of Systems     Objective:    BP 120/66 (BP Location: Left Arm, Patient Position: Sitting, Cuff Size: Normal)   Pulse 72   Temp 98.2 F (36.8 C) (Oral)   Ht 5\' 6"  (1.676 m)   Wt 143 lb 8 oz (65.1 kg)   SpO2 98%   BMI 23.16 kg/m   Wt Readings from Last 3 Encounters:  07/19/18 143 lb 8 oz (65.1 kg)  06/10/18 143 lb (64.9 kg)  05/23/18 143 lb (64.9 kg)    Physical Exam  Constitutional: She appears well-developed and well-nourished. No distress.  HENT:  Head: Normocephalic and atraumatic.  Right Ear: Hearing, tympanic membrane, external ear and ear canal normal.  Left Ear: Hearing, tympanic membrane, external ear and ear canal normal.  Nose: Mucosal edema (nasal congestion R>L) present. No rhinorrhea. Right sinus exhibits frontal sinus tenderness. Right sinus exhibits no maxillary sinus tenderness. Left sinus exhibits frontal sinus tenderness. Left sinus exhibits no maxillary sinus tenderness.  Mouth/Throat: Uvula is midline, oropharynx is clear and moist and mucous membranes are normal. No oropharyngeal exudate, posterior oropharyngeal edema, posterior oropharyngeal erythema or tonsillar abscesses.  Midline  sinus congestion  Eyes: Pupils are equal, round, and reactive to light. Conjunctivae and EOM are normal. No scleral icterus.  Neck: Normal range of motion. Neck supple.  Cardiovascular: Normal rate, regular rhythm, normal heart sounds and intact distal pulses.  No murmur heard. Pulmonary/Chest: Effort normal and breath sounds normal. No respiratory distress. She has no wheezes. She has no rales.  Lymphadenopathy:    She has no cervical adenopathy.  Skin: Skin is warm and dry. No rash noted.  Nursing note and vitals reviewed.     Assessment & Plan:     Problem List Items Addressed This Visit    URI with cough and congestion - Primary    Anticipate viral given short duration. Supportive care reviewed. Red flags to seek further care reviewed. Pt agrees with plan.           No orders of the defined types were placed in this encounter.  No orders of the defined types were placed in this encounter.   Follow up plan: Return if symptoms worsen or fail to improve.  Ria Bush, MD

## 2018-07-19 NOTE — Patient Instructions (Signed)
You have a viral respiratory infection. Antibiotics are not needed for this. Viral infections usually take 7-10 days to resolve. The cough can last a few weeks to go away. Take aleve twice daily with meals for next 3-4 days. Continue mucinex. Push fluids and plenty of rest. Watch for fever >101, worsening productive cough, or not improving as expected. If this happens, call me Call clinic with questions.  Good to see you today. I hope you start feeling better soon.

## 2018-07-26 ENCOUNTER — Telehealth: Payer: Self-pay | Admitting: *Deleted

## 2018-07-26 MED ORDER — AZITHROMYCIN 250 MG PO TABS: ORAL_TABLET | ORAL | 0 refills | Status: DC

## 2018-07-26 NOTE — Telephone Encounter (Signed)
plz notify pt I've sent in zpack antibiotic It's a 5d antibiotic, but long acting so continued effect for 2 wks.  Let us know if not better with this.

## 2018-07-26 NOTE — Telephone Encounter (Signed)
Pt left vm at triage, who states she was to contact office back with update. She was seen 11/19 and was advised per Dr Darnell Level to take Mucinex, Aleve and continue her nasal spray. She states she has done so with no improvement. She is still having congestion and has now become jittery from her cough. She states Dr Darnell Level advised she would not need to be seen again, and that he would just send in a Rx for her; requesting it to be done before the holiday. pls advise

## 2018-07-27 NOTE — Telephone Encounter (Signed)
Patient is aware and will update the office if she does not improve with the zpak.

## 2018-08-08 DIAGNOSIS — R69 Illness, unspecified: Secondary | ICD-10-CM | POA: Diagnosis not present

## 2018-09-01 ENCOUNTER — Other Ambulatory Visit: Payer: Self-pay | Admitting: Family Medicine

## 2018-09-02 NOTE — Telephone Encounter (Signed)
Name of Medication: Lorazepam Name of Pharmacy: [facility] Last Fill or Written Date and Quantity: 08/05/18 Last Office Visit and Type: 07/19/18, acute Next Office Visit and Type: 04/24/19, CPE Last Controlled Substance Agreement Date: 01/22/15 Last UDS: 01/22/15

## 2018-09-02 NOTE — Telephone Encounter (Signed)
Eprescribed.

## 2018-09-26 ENCOUNTER — Encounter (INDEPENDENT_AMBULATORY_CARE_PROVIDER_SITE_OTHER): Payer: Self-pay | Admitting: Orthopedic Surgery

## 2018-09-26 ENCOUNTER — Ambulatory Visit (INDEPENDENT_AMBULATORY_CARE_PROVIDER_SITE_OTHER): Payer: Medicare HMO | Admitting: Physician Assistant

## 2018-09-26 DIAGNOSIS — Z89612 Acquired absence of left leg above knee: Secondary | ICD-10-CM | POA: Diagnosis not present

## 2018-09-26 DIAGNOSIS — S78112A Complete traumatic amputation at level between left hip and knee, initial encounter: Secondary | ICD-10-CM
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
## 2018-09-28 ENCOUNTER — Encounter (INDEPENDENT_AMBULATORY_CARE_PROVIDER_SITE_OTHER): Payer: Self-pay | Admitting: Physician Assistant

## 2018-10-03 DIAGNOSIS — R69 Illness, unspecified: Secondary | ICD-10-CM | POA: Diagnosis not present

## 2018-10-26 ENCOUNTER — Encounter: Payer: Self-pay | Admitting: Family Medicine

## 2018-10-26 ENCOUNTER — Ambulatory Visit (INDEPENDENT_AMBULATORY_CARE_PROVIDER_SITE_OTHER): Payer: Medicare HMO | Admitting: Family Medicine

## 2018-10-26 VITALS — BP 124/72 | HR 78 | Temp 98.3°F | Ht 66.0 in | Wt 145.4 lb

## 2018-10-26 DIAGNOSIS — J069 Acute upper respiratory infection, unspecified: Secondary | ICD-10-CM

## 2018-10-26 MED ORDER — ALBUTEROL SULFATE HFA 108 (90 BASE) MCG/ACT IN AERS: 2.0000 | INHALATION_SPRAY | RESPIRATORY_TRACT | 1 refills | Status: DC | PRN

## 2018-10-26 MED ORDER — AMOXICILLIN 875 MG PO TABS
875.0000 mg | ORAL_TABLET | Freq: Two times a day (BID) | ORAL | 0 refills | Status: DC
Start: 2018-10-26 — End: 2019-04-24

## 2018-10-26 NOTE — Patient Instructions (Signed)
Good to see you today   Please stop your allergy medication for a few days and see if that helps with drying of mucus. Add plain guaifenesin if you find a formula you can easily swallow.  I have sent in a prescription for an albuterol inhaler, you can use 2 puffs every 4-6 hours as needed for cough, chest tightness  I have printed an antibiotic prescription. If you are not better in 5-7 days, you can get it filled  Drink enough liquid to make your urine light yellow  If not better with the above or if symptoms significantly worsen, please follow up
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
## 2018-10-31 ENCOUNTER — Other Ambulatory Visit: Payer: Self-pay | Admitting: Family Medicine

## 2018-10-31 NOTE — Telephone Encounter (Signed)
Name of Medication: Lorazepam Name of Pharmacy: [facility] Last Fill or Written Date and Quantity: 10/11/18, #20 Last Office Visit and Type: 10/26/18, acute w/ Debbie Next Office Visit and Type: 04/24/19, CPE Pt 2 Last Controlled Substance Agreement Date: 01/22/15 Last UDS: 01/22/15

## 2018-11-01 NOTE — Telephone Encounter (Signed)
Eprescribed.

## 2018-11-07 DIAGNOSIS — Z89612 Acquired absence of left leg above knee: Secondary | ICD-10-CM | POA: Diagnosis not present

## 2018-11-24 ENCOUNTER — Encounter: Payer: Self-pay | Admitting: Family Medicine

## 2018-11-26 MED ORDER — LORAZEPAM 1 MG PO TABS: 0.5000 mg | ORAL_TABLET | Freq: Two times a day (BID) | ORAL | 1 refills | Status: DC | PRN

## 2019-02-16 ENCOUNTER — Other Ambulatory Visit: Payer: Self-pay | Admitting: Family Medicine

## 2019-02-16 ENCOUNTER — Ambulatory Visit
Admission: RE | Admit: 2019-02-16 | Discharge: 2019-02-16 | Disposition: A | Discharge status: Home / Self Care | Payer: Medicare HMO | Source: Ambulatory Visit | Attending: Family Medicine | Admitting: Family Medicine

## 2019-02-16 ENCOUNTER — Other Ambulatory Visit: Payer: Self-pay

## 2019-02-16 DIAGNOSIS — R921 Mammographic calcification found on diagnostic imaging of breast: Secondary | ICD-10-CM

## 2019-03-21 ENCOUNTER — Other Ambulatory Visit: Payer: Self-pay | Admitting: Family Medicine

## 2019-03-21 NOTE — Telephone Encounter (Signed)
Eprescribed.

## 2019-04-06 DIAGNOSIS — R69 Illness, unspecified: Secondary | ICD-10-CM | POA: Diagnosis not present

## 2019-04-16 ENCOUNTER — Other Ambulatory Visit: Payer: Self-pay | Admitting: Family Medicine

## 2019-04-16 DIAGNOSIS — E785 Hyperlipidemia, unspecified: Secondary | ICD-10-CM

## 2019-04-16 DIAGNOSIS — I700 Atherosclerosis of aorta: Secondary | ICD-10-CM

## 2019-04-16 DIAGNOSIS — E538 Deficiency of other specified B group vitamins: Secondary | ICD-10-CM

## 2019-04-17 ENCOUNTER — Other Ambulatory Visit: Payer: Self-pay

## 2019-04-17 ENCOUNTER — Other Ambulatory Visit (INDEPENDENT_AMBULATORY_CARE_PROVIDER_SITE_OTHER): Payer: Medicare HMO

## 2019-04-17 ENCOUNTER — Ambulatory Visit: Payer: Medicare HMO

## 2019-04-17 ENCOUNTER — Ambulatory Visit (INDEPENDENT_AMBULATORY_CARE_PROVIDER_SITE_OTHER): Payer: Medicare HMO

## 2019-04-17 VITALS — Ht 66.0 in | Wt 150.0 lb

## 2019-04-17 DIAGNOSIS — Z0000 Encounter for general adult medical examination without abnormal findings: Secondary | ICD-10-CM

## 2019-04-17 DIAGNOSIS — E538 Deficiency of other specified B group vitamins: Secondary | ICD-10-CM | POA: Diagnosis not present

## 2019-04-17 DIAGNOSIS — E785 Hyperlipidemia, unspecified: Secondary | ICD-10-CM | POA: Diagnosis not present

## 2019-04-17 LAB — COMPREHENSIVE METABOLIC PANEL
ALT: 11 U/L (ref 0–35)
AST: 13 U/L (ref 0–37)
Albumin: 4.4 g/dL (ref 3.5–5.2)
Alkaline Phosphatase: 52 U/L (ref 39–117)
BUN: 8 mg/dL (ref 6–23)
CO2: 28 mEq/L (ref 19–32)
Calcium: 9.5 mg/dL (ref 8.4–10.5)
Chloride: 101 mEq/L (ref 96–112)
Creatinine, Ser: 0.68 mg/dL (ref 0.40–1.20)
GFR: 88.03 mL/min (ref >60.00)
Glucose, Bld: 114 mg/dL — ABNORMAL HIGH (ref 70–99)
Potassium: 3.5 mEq/L (ref 3.5–5.1)
Sodium: 138 mEq/L (ref 135–145)
Total Bilirubin: 0.4 mg/dL (ref 0.2–1.2)
Total Protein: 7.3 g/dL (ref 6.0–8.3)

## 2019-04-17 LAB — LIPID PANEL
Cholesterol: 238 mg/dL — ABNORMAL HIGH (ref 0–200)
HDL: 66.7 mg/dL (ref >39.00)
LDL Cholesterol: 152 mg/dL — ABNORMAL HIGH (ref 0–99)
NonHDL: 171.22
Total CHOL/HDL Ratio: 4
Triglycerides: 95 mg/dL (ref 0.0–149.0)
VLDL: 19 mg/dL (ref 0.0–40.0)

## 2019-04-17 LAB — VITAMIN B12: Vitamin B-12: 476 pg/mL (ref 211–911)
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
## 2019-04-17 NOTE — Patient Instructions (Signed)
Ms. Rachel Mcintosh , Thank you for taking time to come for your Medicare Wellness Visit. I appreciate your ongoing commitment to your health goals. Please review the following plan we discussed and let me know if I can assist you in the future.   Screening recommendations/referrals: Colonoscopy: 05/2012 Mammogram: scheduled Bone Density: n/a Recommended yearly ophthalmology/optometry visit for glaucoma screening and checkup Recommended yearly dental visit for hygiene and checkup  Vaccinations: Influenza vaccine: 07/2018 Pneumococcal vaccine: n/a Tdap vaccine: 08/2016 Shingles vaccine: discussed    Advanced directives: Please bring a copy of your POA (Power of Attorney) and/or Living Will to your next appointment.    Conditions/risks identified: none  Next appointment: 04/24/2019 at 10:30  Preventive Care 40-64 Years, Female Preventive care refers to lifestyle choices and visits with your health care provider that can promote health and wellness. What does preventive care include?  A yearly physical exam. This is also called an annual well check.  Dental exams once or twice a year.  Routine eye exams. Ask your health care provider how often you should have your eyes checked.  Personal lifestyle choices, including:  Daily care of your teeth and gums.  Regular physical activity.  Eating a healthy diet.  Avoiding tobacco and drug use.  Limiting alcohol use.  Practicing safe sex.  Taking low-dose aspirin daily starting at age 38.  Taking vitamin and mineral supplements as recommended by your health care provider. What happens during an annual well check? The services and screenings done by your health care provider during your annual well check will depend on your age, overall health, lifestyle risk factors, and family history of disease. Counseling  Your health care provider may ask you questions about your:  Alcohol use.  Tobacco use.  Drug use.  Emotional well-being.   Home and relationship well-being.  Sexual activity.  Eating habits.  Work and work Statistician.  Method of birth control.  Menstrual cycle.  Pregnancy history. Screening  You may have the following tests or measurements:  Height, weight, and BMI.  Blood pressure.  Lipid and cholesterol levels. These may be checked every 5 years, or more frequently if you are over 22 years old.  Skin check.  Lung cancer screening. You may have this screening every year starting at age 28 if you have a 30-pack-year history of smoking and currently smoke or have quit within the past 15 years.  Fecal occult blood test (FOBT) of the stool. You may have this test every year starting at age 35.  Flexible sigmoidoscopy or colonoscopy. You may have a sigmoidoscopy every 5 years or a colonoscopy every 10 years starting at age 65.  Hepatitis C blood test.  Hepatitis B blood test.  Sexually transmitted disease (STD) testing.  Diabetes screening. This is done by checking your blood sugar (glucose) after you have not eaten for a while (fasting). You may have this done every 1-3 years.  Mammogram. This may be done every 1-2 years. Talk to your health care provider about when you should start having regular mammograms. This may depend on whether you have a family history of breast cancer.  BRCA-related cancer screening. This may be done if you have a family history of breast, ovarian, tubal, or peritoneal cancers.  Pelvic exam and Pap test. This may be done every 3 years starting at age 41. Starting at age 74, this may be done every 5 years if you have a Pap test in combination with an HPV test.  Bone density scan.  This is done to screen for osteoporosis. You may have this scan if you are at high risk for osteoporosis. Discuss your test results, treatment options, and if necessary, the need for more tests with your health care provider. Vaccines  Your health care provider may recommend certain  vaccines, such as:  Influenza vaccine. This is recommended every year.  Tetanus, diphtheria, and acellular pertussis (Tdap, Td) vaccine. You may need a Td booster every 10 years.  Zoster vaccine. You may need this after age 82.  Pneumococcal 13-valent conjugate (PCV13) vaccine. You may need this if you have certain conditions and were not previously vaccinated.  Pneumococcal polysaccharide (PPSV23) vaccine. You may need one or two doses if you smoke cigarettes or if you have certain conditions. Talk to your health care provider about which screenings and vaccines you need and how often you need them. This information is not intended to replace advice given to you by your health care provider. Make sure you discuss any questions you have with your health care provider. Document Released: 09/13/2015 Document Revised: 05/06/2016 Document Reviewed: 06/18/2015 Elsevier Interactive Patient Education  2017 [place] Prevention in the Home Falls can cause injuries. They can happen to people of all ages. There are many things you can do to make your home safe and to help prevent falls. What can I do on the outside of my home?  Regularly fix the edges of walkways and driveways and fix any cracks.  Remove anything that might make you trip as you walk through a door, such as a raised step or threshold.  Trim any bushes or trees on the path to your home.  Use bright outdoor lighting.  Clear any walking paths of anything that might make someone trip, such as rocks or tools.  Regularly check to see if handrails are loose or broken. Make sure that both sides of any steps have handrails.  Any raised decks and porches should have guardrails on the edges.  Have any leaves, snow, or ice cleared regularly.  Use sand or salt on walking paths during winter.  Clean up any spills in your garage right away. This includes oil or grease spills. What can I do in the bathroom?  Use night  lights.  Install grab bars by the toilet and in the tub and shower. Do not use towel bars as grab bars.  Use non-skid mats or decals in the tub or shower.  If you need to sit down in the shower, use a plastic, non-slip stool.  Keep the floor dry. Clean up any water that spills on the floor as soon as it happens.  Remove soap buildup in the tub or shower regularly.  Attach bath mats securely with double-sided non-slip rug tape.  Do not have throw rugs and other things on the floor that can make you trip. What can I do in the bedroom?  Use night lights.  Make sure that you have a light by your bed that is easy to reach.  Do not use any sheets or blankets that are too big for your bed. They should not hang down onto the floor.  Have a firm chair that has side arms. You can use this for support while you get dressed.  Do not have throw rugs and other things on the floor that can make you trip. What can I do in the kitchen?  Clean up any spills right away.  Avoid walking on wet floors.  Keep items that you use a lot in easy-to-reach places.  If you need to reach something above you, use a strong step stool that has a grab bar.  Keep electrical cords out of the way.  Do not use floor polish or wax that makes floors slippery. If you must use wax, use non-skid floor wax.  Do not have throw rugs and other things on the floor that can make you trip. What can I do with my stairs?  Do not leave any items on the stairs.  Make sure that there are handrails on both sides of the stairs and use them. Fix handrails that are broken or loose. Make sure that handrails are as long as the stairways.  Check any carpeting to make sure that it is firmly attached to the stairs. Fix any carpet that is loose or worn.  Avoid having throw rugs at the top or bottom of the stairs. If you do have throw rugs, attach them to the floor with carpet tape.  Make sure that you have a light switch at the  top of the stairs and the bottom of the stairs. If you do not have them, ask someone to add them for you. What else can I do to help prevent falls?  Wear shoes that:  Do not have high heels.  Have rubber bottoms.  Are comfortable and fit you well.  Are closed at the toe. Do not wear sandals.  If you use a stepladder:  Make sure that it is fully opened. Do not climb a closed stepladder.  Make sure that both sides of the stepladder are locked into place.  Ask someone to hold it for you, if possible.  Clearly mark and make sure that you can see:  Any grab bars or handrails.  First and last steps.  Where the edge of each step is.  Use tools that help you move around (mobility aids) if they are needed. These include:  Canes.  Walkers.  Scooters.  Crutches.  Turn on the lights when you go into a dark area. Replace any light bulbs as soon as they burn out.  Set up your furniture so you have a clear path. Avoid moving your furniture around.  If any of your floors are uneven, fix them.  If there are any pets around you, be aware of where they are.  Review your medicines with your doctor. Some medicines can make you feel dizzy. This can increase your chance of falling. Ask your doctor what other things that you can do to help prevent falls. This information is not intended to replace advice given to you by your health care provider. Make sure you discuss any questions you have with your health care provider. Document Released: 06/13/2009 Document Revised: 01/23/2016 Document Reviewed: 09/21/2014 Elsevier Interactive Patient Education  2017 [employer].

## 2019-04-17 NOTE — Progress Notes (Signed)
PCP notes:  Health Maintenance:  No gaps  Abnormal Screenings:  None  Patient concerns:  None  Nurse concerns:  None  Next PCP appt.: 04/24/2019 at 10:30

## 2019-04-24 ENCOUNTER — Encounter: Payer: Self-pay | Admitting: Family Medicine

## 2019-04-24 ENCOUNTER — Other Ambulatory Visit: Payer: Self-pay

## 2019-04-24 ENCOUNTER — Ambulatory Visit (INDEPENDENT_AMBULATORY_CARE_PROVIDER_SITE_OTHER): Payer: Medicare HMO | Admitting: Family Medicine

## 2019-04-24 VITALS — BP 128/82 | HR 70 | Temp 97.6°F | Ht 65.75 in | Wt 149.1 lb

## 2019-04-24 DIAGNOSIS — Z0000 Encounter for general adult medical examination without abnormal findings: Secondary | ICD-10-CM

## 2019-04-24 DIAGNOSIS — M8580 Other specified disorders of bone density and structure, unspecified site: Secondary | ICD-10-CM | POA: Diagnosis not present

## 2019-04-24 DIAGNOSIS — E785 Hyperlipidemia, unspecified: Secondary | ICD-10-CM | POA: Diagnosis not present

## 2019-04-24 DIAGNOSIS — I700 Atherosclerosis of aorta: Secondary | ICD-10-CM | POA: Diagnosis not present

## 2019-04-24 DIAGNOSIS — Z89619 Acquired absence of unspecified leg above knee: Secondary | ICD-10-CM

## 2019-04-24 DIAGNOSIS — J439 Emphysema, unspecified: Secondary | ICD-10-CM | POA: Diagnosis not present

## 2019-04-24 DIAGNOSIS — E538 Deficiency of other specified B group vitamins: Secondary | ICD-10-CM | POA: Diagnosis not present

## 2019-04-24 DIAGNOSIS — Z87891 Personal history of nicotine dependence: Secondary | ICD-10-CM

## 2019-04-24 DIAGNOSIS — F4323 Adjustment disorder with mixed anxiety and depressed mood: Secondary | ICD-10-CM

## 2019-04-24 DIAGNOSIS — R69 Illness, unspecified: Secondary | ICD-10-CM | POA: Diagnosis not present

## 2019-04-24 DIAGNOSIS — S78112A Complete traumatic amputation at level between left hip and knee, initial encounter: Secondary | ICD-10-CM

## 2019-04-24 MED ORDER — LORAZEPAM 1 MG PO TABS: 0.5000 mg | ORAL_TABLET | Freq: Two times a day (BID) | ORAL | 5 refills | Status: DC | PRN

## 2019-04-24 NOTE — Assessment & Plan Note (Signed)
Declines lung cancer screening CT program.

## 2019-04-24 NOTE — Assessment & Plan Note (Signed)
By CT, quit smoking 2016, stable period off respiratory medication besides rare PRN albuterol

## 2019-04-24 NOTE — Assessment & Plan Note (Signed)
Chronic off meds. The 10-year ASCVD risk score Mikey Bussing DC Brooke Bonito., et al., 2013) is: 3.3%   Values used to calculate the score:     Age: 63 years     Sex: Female     Is Non-Hispanic African American: No     Diabetic: No     Tobacco smoker: No     Systolic Blood Pressure: 0000000 mmHg     Is BP treated: No     HDL Cholesterol: 66.7 mg/dL     Total Cholesterol: 238 mg/dL

## 2019-04-24 NOTE — Patient Instructions (Addendum)
Gym letter provided.  Touch base with GI about difficulty swallowing Sugar was a bit elevated - watch added sugars in the diet.  We will continue to watch cholesterol levels - continue low cholesterol diet.  Return as needed or in 1 year for next physical.   Health Maintenance, Female Adopting a healthy lifestyle and getting preventive care are important in promoting health and wellness. Ask your health care provider about:  The right schedule for you to have regular tests and exams.  Things you can do on your own to prevent diseases and keep yourself healthy. What should I know about diet, weight, and exercise? Eat a healthy diet   Eat a diet that includes plenty of vegetables, fruits, low-fat dairy products, and lean protein.  Do not eat a lot of foods that are high in solid fats, added sugars, or sodium. Maintain a healthy weight Body mass index (BMI) is used to identify weight problems. It estimates body fat based on height and weight. Your health care provider can help determine your BMI and help you achieve or maintain a healthy weight. Get regular exercise Get regular exercise. This is one of the most important things you can do for your health. Most adults should:  Exercise for at least 150 minutes each week. The exercise should increase your heart rate and make you sweat (moderate-intensity exercise).  Do strengthening exercises at least twice a week. This is in addition to the moderate-intensity exercise.  Spend less time sitting. Even light physical activity can be beneficial. Watch cholesterol and blood lipids Have your blood tested for lipids and cholesterol at 61 years of age, then have this test every 5 years. Have your cholesterol levels checked more often if:  Your lipid or cholesterol levels are high.  You are older than 61 years of age.  You are at high risk for heart disease. What should I know about cancer screening? Depending on your health history and  family history, you may need to have cancer screening at various ages. This may include screening for:  Breast cancer.  Cervical cancer.  Colorectal cancer.  Skin cancer.  Lung cancer. What should I know about heart disease, diabetes, and high blood pressure? Blood pressure and heart disease  High blood pressure causes heart disease and increases the risk of stroke. This is more likely to develop in people who have high blood pressure readings, are of African descent, or are overweight.  Have your blood pressure checked: ? Every 3-5 years if you are 65-33 years of age. ? Every year if you are 49 years old or older. Diabetes Have regular diabetes screenings. This checks your fasting blood sugar level. Have the screening done:  Once every three years after age 69 if you are at a normal weight and have a low risk for diabetes.  More often and at a younger age if you are overweight or have a high risk for diabetes. What should I know about preventing infection? Hepatitis B If you have a higher risk for hepatitis B, you should be screened for this virus. Talk with your health care provider to find out if you are at risk for hepatitis B infection. Hepatitis C Testing is recommended for:  Everyone born from [birth year] through 1963.  Anyone with known risk factors for hepatitis C. Sexually transmitted infections (STIs)  Get screened for STIs, including gonorrhea and chlamydia, if: ? You are sexually active and are younger than 61 years of age. ? You are older  than 61 years of age and your health care provider tells you that you are at risk for this type of infection. ? Your sexual activity has changed since you were last screened, and you are at increased risk for chlamydia or gonorrhea. Ask your health care provider if you are at risk.  Ask your health care provider about whether you are at high risk for HIV. Your health care provider may recommend a prescription medicine to help prevent  HIV infection. If you choose to take medicine to prevent HIV, you should first get tested for HIV. You should then be tested every 3 months for as long as you are taking the medicine. Pregnancy  If you are about to stop having your period (premenopausal) and you may become pregnant, seek counseling before you get pregnant.  Take 400 to 800 micrograms (mcg) of folic acid every day if you become pregnant.  Ask for birth control (contraception) if you want to prevent pregnancy. Osteoporosis and menopause Osteoporosis is a disease in which the bones lose minerals and strength with aging. This can result in bone fractures. If you are 87 years old or older, or if you are at risk for osteoporosis and fractures, ask your health care provider if you should:  Be screened for bone loss.  Take a calcium or vitamin D supplement to lower your risk of fractures.  Be given hormone replacement therapy (HRT) to treat symptoms of menopause. Follow these instructions at home: Lifestyle  Do not use any products that contain nicotine or tobacco, such as cigarettes, e-cigarettes, and chewing tobacco. If you need help quitting, ask your health care provider.  Do not use street drugs.  Do not share needles.  Ask your health care provider for help if you need support or information about quitting drugs. Alcohol use  Do not drink alcohol if: ? Your health care provider tells you not to drink. ? You are pregnant, may be pregnant, or are planning to become pregnant.  If you drink alcohol: ? Limit how much you use to 0-1 drink a day. ? Limit intake if you are breastfeeding.  Be aware of how much alcohol is in your drink. In the [place] one drink equals one 12 oz bottle of beer (355 mL), one 5 oz glass of wine (148 mL), or one 1 oz glass of hard liquor (44 mL). General instructions  Schedule regular health, dental, and eye exams.  Stay current with your vaccines.  Tell your health care provider if: ? You  often feel depressed. ? You have ever been abused or do not feel safe at home. Summary  Adopting a healthy lifestyle and getting preventive care are important in promoting health and wellness.  Follow your health care provider's instructions about healthy diet, exercising, and getting tested or screened for diseases.  Follow your health care provider's instructions on monitoring your cholesterol and blood pressure. This information is not intended to replace advice given to you by your health care provider. Make sure you discuss any questions you have with your health care provider. Document Released: 03/02/2011 Document Revised: 08/10/2018 Document Reviewed: 08/10/2018 Elsevier Patient Education  2020 [employer].

## 2019-04-24 NOTE — Assessment & Plan Note (Signed)
Low ASCVD risk.

## 2019-04-24 NOTE — Assessment & Plan Note (Signed)
Preventative protocols reviewed and updated unless pt declined. Discussed healthy diet and lifestyle.  

## 2019-04-24 NOTE — Assessment & Plan Note (Signed)
Levels stable on oral replacement.

## 2019-04-24 NOTE — Progress Notes (Signed)
This visit was conducted in person.  BP 128/82 (BP Location: Right Arm, Patient Position: Sitting, Cuff Size: Normal)   Pulse 70   Temp 97.6 F (36.4 C) (Temporal)   Ht 5' 5.75" (1.67 m)   Wt 149 lb 1 oz (67.6 kg)   SpO2 98%   BMI 24.24 kg/m    CC: CPE Subjective:    Patient ID: Rachel Mcintosh, female    DOB: 1956-05-04, 62 y.o.   MRN: [id number]  HPI: Rachel Mcintosh is a 63 y.o. female presenting on 04/24/2019 for Annual Exam (Prt 2. )   Saw health advisor last week for medicare wellness visit. Note reviewed.  Requests 90d supply of lorazepam. Takes 1 a day PRN anxiety.  Requests letter to be able to participate at gym. Reviewed chances of contagion are higher in crowded indoors than outside - would recommend exercising in well ventilated area. Recommend following CDC social distancing guidelines as well.   Some ongoing dysphagia despite EGD with esophageal ring dilation 2016 ([facility])  Preventative: Colonoscopy 2013 WNL [facility])  Mammogram Birads3 01/2019 - rec rpt 06/2019.  Well woman exam 07/2012 [facility]; Dr. Christophe Louis). No paps 2/2 h/o hysterectomy. Ovaries remain. Lung cancer screening - eligible - declines today Flu yearly  Pneumovax 05/2012  Tetanus 2008, 08/2016 Advanced directive - brought in today - will scan. Daughter Rachel Mcintosh would be HCPOA.ok with life support/code, but doesn not want prolonged life support if terminal condition.  Seat belt use discussed Sunscreen use discussed. No changing moles. Ex smoker - quit 2016. Smoked 40 years 1 ppd.  Alcohol - seldom  Dentist Q6 mo  Eye exam - yearly   Lives alone Edu: HS Occupation: disability since [place] 2008, was supervisor at [employer] Activity: no regular exercise  Diet: some water, fruits daily     Relevant past medical, surgical, family and social history reviewed and updated as indicated. Interim medical history since our last visit reviewed. Allergies and medications reviewed and  updated. Outpatient Medications Prior to Visit  Medication Sig Dispense Refill  . albuterol (PROVENTIL HFA;VENTOLIN HFA) 108 (90 Base) MCG/ACT inhaler Inhale 2 puffs into the lungs every 4 (four) hours as needed for wheezing or shortness of breath (cough, shortness of breath or wheezing.). 1 Inhaler 1  . Calcium Carbonate-Vitamin D3 (CALCIUM 600/VITAMIN D) 600-400 MG-UNIT TABS Take 1 tablet by mouth daily.    . cyanocobalamin (V-R VITAMIN B-12) 500 MCG tablet Take 1 tablet (500 mcg total) by mouth daily. (Patient taking differently: Take 500 mcg by mouth 3 (three) times a week. )    . fluticasone (FLONASE) 50 MCG/ACT nasal spray Place 2 sprays into both nostrils daily. (Patient taking differently: Place 2 sprays into both nostrils as needed. ) 16 g 6  . Naproxen Sodium (ALEVE PO) Take 1 tablet by mouth as needed.    Marland Kitchen LORazepam (ATIVAN) 1 MG tablet TAKE 0.5-1 TABLETS (0.5-1 MG TOTAL) BY MOUTH 2 (TWO) TIMES DAILY AS NEEDED FOR ANXIETY. 30 tablet 1  . amoxicillin (AMOXIL) 875 MG tablet Take 1 tablet (875 mg total) by mouth 2 (two) times daily. (Patient not taking: Reported on 04/17/2019) 14 tablet 0   No facility-administered medications prior to visit.      Per HPI unless specifically indicated in ROS section below Review of Systems  Constitutional: Negative for activity change, appetite change, chills, fatigue, fever and unexpected weight change.  HENT: Negative for hearing loss.   Eyes: Negative for visual disturbance.  Respiratory: Negative for cough, chest tightness, shortness of breath and wheezing.   Cardiovascular: Negative for chest pain, palpitations and leg swelling.  Gastrointestinal: Negative for abdominal distention, abdominal pain, blood in stool, constipation, diarrhea, nausea and vomiting.  Genitourinary: Negative for difficulty urinating and hematuria.  Musculoskeletal: Negative for arthralgias, myalgias and neck pain.  Skin: Negative for rash.  Neurological: Positive for  headaches. Negative for dizziness, seizures and syncope.  Hematological: Negative for adenopathy. Bruises/bleeds easily.  Psychiatric/Behavioral: Negative for dysphoric mood. The patient is not nervous/anxious.    Objective:    BP 128/82 (BP Location: Right Arm, Patient Position: Sitting, Cuff Size: Normal)   Pulse 70   Temp 97.6 F (36.4 C) (Temporal)   Ht 5' 5.75" (1.67 m)   Wt 149 lb 1 oz (67.6 kg)   SpO2 98%   BMI 24.24 kg/m   Wt Readings from Last 3 Encounters:  04/24/19 149 lb 1 oz (67.6 kg)  04/17/19 150 lb (68 kg)  10/26/18 145 lb 6.4 oz (66 kg)    Physical Exam Vitals signs and nursing note reviewed.  Constitutional:      General: She is not in acute distress.    Appearance: She is well-developed. She is not ill-appearing.  HENT:     Head: Normocephalic and atraumatic.     Right Ear: Hearing, tympanic membrane, ear canal and external ear normal.     Left Ear: Hearing, tympanic membrane, ear canal and external ear normal.     Nose: Nose normal.     Mouth/Throat:     Mouth: Mucous membranes are moist.     Pharynx: Uvula midline. No oropharyngeal exudate or posterior oropharyngeal erythema.  Eyes:     General: No scleral icterus.    Extraocular Movements: Extraocular movements intact.     Conjunctiva/sclera: Conjunctivae normal.     Pupils: Pupils are equal, round, and reactive to light.  Neck:     Musculoskeletal: Normal range of motion and neck supple.  Cardiovascular:     Rate and Rhythm: Normal rate and regular rhythm.     Pulses: Normal pulses.          Radial pulses are 2+ on the right side and 2+ on the left side.     Heart sounds: Normal heart sounds. No murmur.  Pulmonary:     Effort: Pulmonary effort is normal. No respiratory distress.     Breath sounds: Normal breath sounds. No wheezing, rhonchi or rales.  Abdominal:     General: Abdomen is flat. Bowel sounds are normal. There is no distension.     Palpations: Abdomen is soft. There is no mass.      Tenderness: There is no abdominal tenderness. There is no guarding or rebound.     Hernia: No hernia is present.  Musculoskeletal: Normal range of motion.     Comments: S/p L AKA  Lymphadenopathy:     Cervical: No cervical adenopathy.  Skin:    General: Skin is warm and dry.     Findings: No rash.  Neurological:     General: No focal deficit present.     Mental Status: She is alert and oriented to person, place, and time.     Comments: CN grossly intact, station and gait intact  Psychiatric:        Mood and Affect: Mood normal.        Behavior: Behavior normal.        Thought Content: Thought content normal.  Judgment: Judgment normal.       Results for orders placed or performed in visit on 04/17/19  Vitamin B12  Result Value Ref Range   Vitamin B-12 476 211 - 911 pg/mL  Comprehensive metabolic panel  Result Value Ref Range   Sodium 138 135 - 145 mEq/L   Potassium 3.5 3.5 - 5.1 mEq/L   Chloride 101 96 - 112 mEq/L   CO2 28 19 - 32 mEq/L   Glucose, Bld 114 (H) 70 - 99 mg/dL   BUN 8 6 - 23 mg/dL   Creatinine, Ser 0.68 0.40 - 1.20 mg/dL   Total Bilirubin 0.4 0.2 - 1.2 mg/dL   Alkaline Phosphatase 52 39 - 117 U/L   AST 13 0 - 37 U/L   ALT 11 0 - 35 U/L   Total Protein 7.3 6.0 - 8.3 g/dL   Albumin 4.4 3.5 - 5.2 g/dL   Calcium 9.5 8.4 - 10.5 mg/dL   GFR 88.03 >60.00 mL/min  Lipid panel  Result Value Ref Range   Cholesterol 238 (H) 0 - 200 mg/dL   Triglycerides 95.0 0.0 - 149.0 mg/dL   HDL 66.70 >39.00 mg/dL   VLDL 19.0 0.0 - 40.0 mg/dL   LDL Cholesterol 152 (H) 0 - 99 mg/dL   Total CHOL/HDL Ratio 4    NonHDL 171.22    Assessment & Plan:   Problem List Items Addressed This Visit    Vitamin B12 deficiency    Levels stable on oral replacement.      Osteopenia    Continue calcium/vit D. Consider DEXA.      Hyperlipidemia    Chronic off meds. The 10-year ASCVD risk score Mikey Bussing DC Brooke Bonito., et al., 2013) is: 3.3%   Values used to calculate the score:     Age:  21 years     Sex: Female     Is Non-Hispanic African American: No     Diabetic: No     Tobacco smoker: No     Systolic Blood Pressure: 0000000 mmHg     Is BP treated: No     HDL Cholesterol: 66.7 mg/dL     Total Cholesterol: 238 mg/dL       Health maintenance examination - Primary    Preventative protocols reviewed and updated unless pt declined. Discussed healthy diet and lifestyle.       Ex-smoker    Declines lung cancer screening CT program.       Emphysema/COPD ([facility])    By CT, quit smoking 2016, stable period off respiratory medication besides rare PRN albuterol      Adjustment disorder with mixed anxiety and depressed mood    Stable period on PRN lorazepam. Reviewed controlled substance, rec against 90d supply. Will refill today.       Above knee amputation of left lower extremity (HCC)   Abdominal aortic atherosclerosis (HCC)    Low ASCVD risk.           Meds ordered this encounter  Medications  . LORazepam (ATIVAN) 1 MG tablet    Sig: Take 0.5-1 tablets (0.5-1 mg total) by mouth 2 (two) times daily as needed for anxiety.    Dispense:  30 tablet    Refill:  5    Not to exceed 4 additional fills before 05/25/2019   No orders of the defined types were placed in this encounter.   Follow up plan: Return in about 1 year (around 04/23/2020) for annual exam, prior fasting for blood work.  Garlon Hatchet  Danise Mina, MD

## 2019-04-24 NOTE — Assessment & Plan Note (Signed)
Stable period on PRN lorazepam. Reviewed controlled substance, rec against 90d supply. Will refill today.

## 2019-04-24 NOTE — Assessment & Plan Note (Signed)
Continue calcium/vit D. Consider DEXA.

## 2019-05-03 DIAGNOSIS — R69 Illness, unspecified: Secondary | ICD-10-CM | POA: Diagnosis not present

## 2019-05-18 ENCOUNTER — Other Ambulatory Visit: Payer: Self-pay | Admitting: Family Medicine

## 2019-05-18 DIAGNOSIS — J069 Acute upper respiratory infection, unspecified: Secondary | ICD-10-CM

## 2019-05-24 ENCOUNTER — Other Ambulatory Visit: Payer: Self-pay | Admitting: Family Medicine

## 2019-06-07 ENCOUNTER — Ambulatory Visit
Admission: RE | Admit: 2019-06-07 | Discharge: 2019-06-07 | Disposition: A | Discharge status: Home / Self Care | Payer: Medicare HMO | Source: Ambulatory Visit | Attending: Family Medicine | Admitting: Family Medicine

## 2019-06-07 ENCOUNTER — Other Ambulatory Visit: Payer: Self-pay

## 2019-06-07 DIAGNOSIS — R921 Mammographic calcification found on diagnostic imaging of breast: Secondary | ICD-10-CM

## 2019-06-17 DIAGNOSIS — R69 Illness, unspecified: Secondary | ICD-10-CM | POA: Diagnosis not present

## 2019-07-14 ENCOUNTER — Ambulatory Visit: Payer: Medicare HMO | Admitting: Primary Care

## 2019-07-14 ENCOUNTER — Encounter: Payer: Self-pay | Admitting: Family Medicine

## 2019-07-14 ENCOUNTER — Ambulatory Visit (INDEPENDENT_AMBULATORY_CARE_PROVIDER_SITE_OTHER): Payer: Medicare HMO | Admitting: Family Medicine

## 2019-07-14 ENCOUNTER — Other Ambulatory Visit: Payer: Self-pay

## 2019-07-14 VITALS — BP 134/80 | HR 75 | Temp 97.9°F | Ht 65.75 in | Wt 147.3 lb

## 2019-07-14 DIAGNOSIS — R109 Unspecified abdominal pain: Secondary | ICD-10-CM

## 2019-07-14 DIAGNOSIS — N3020 Other chronic cystitis without hematuria: Secondary | ICD-10-CM | POA: Diagnosis not present

## 2019-07-14 LAB — CBC WITH DIFFERENTIAL/PLATELET
Basophils Absolute: 0.1 10*3/uL (ref 0.0–0.1)
Basophils Relative: 1.6 % (ref 0.0–3.0)
Eosinophils Absolute: 0.1 10*3/uL (ref 0.0–0.7)
Eosinophils Relative: 2 % (ref 0.0–5.0)
HCT: 40.7 % (ref 36.0–46.0)
Hemoglobin: 13.8 g/dL (ref 12.0–15.0)
Lymphocytes Relative: 36.9 % (ref 12.0–46.0)
Lymphs Abs: 1.9 10*3/uL (ref 0.7–4.0)
MCHC: 34 g/dL (ref 30.0–36.0)
MCV: 83.8 fl (ref 78.0–100.0)
Monocytes Absolute: 0.4 10*3/uL (ref 0.1–1.0)
Monocytes Relative: 6.9 % (ref 3.0–12.0)
Neutro Abs: 2.7 10*3/uL (ref 1.4–7.7)
Neutrophils Relative %: 52.6 % (ref 43.0–77.0)
Platelets: 306 10*3/uL (ref 150.0–400.0)
RBC: 4.86 Mil/uL (ref 3.87–5.11)
RDW: 13.3 % (ref 11.5–15.5)
WBC: 5.2 10*3/uL (ref 4.0–10.5)

## 2019-07-14 LAB — POC URINALSYSI DIPSTICK (AUTOMATED)
Bilirubin, UA: NEGATIVE
Blood, UA: NEGATIVE
Glucose, UA: NEGATIVE
Ketones, UA: NEGATIVE
Leukocytes, UA: NEGATIVE
Nitrite, UA: NEGATIVE
Protein, UA: NEGATIVE
Spec Grav, UA: 1.01 (ref 1.010–1.025)
Urobilinogen, UA: 0.2 E.U./dL
pH, UA: 6 (ref 5.0–8.0)

## 2019-07-14 LAB — COMPREHENSIVE METABOLIC PANEL
ALT: 14 U/L (ref 0–35)
AST: 17 U/L (ref 0–37)
Albumin: 4.6 g/dL (ref 3.5–5.2)
Alkaline Phosphatase: 55 U/L (ref 39–117)
BUN: 7 mg/dL (ref 6–23)
CO2: 29 mEq/L (ref 19–32)
Calcium: 9.8 mg/dL (ref 8.4–10.5)
Chloride: 104 mEq/L (ref 96–112)
Creatinine, Ser: 0.66 mg/dL (ref 0.40–1.20)
GFR: 91.04 mL/min (ref >60.00)
Glucose, Bld: 102 mg/dL — ABNORMAL HIGH (ref 70–99)
Potassium: 3.6 mEq/L (ref 3.5–5.1)
Sodium: 140 mEq/L (ref 135–145)
Total Bilirubin: 0.5 mg/dL (ref 0.2–1.2)
Total Protein: 7.3 g/dL (ref 6.0–8.3)

## 2019-07-14 LAB — LIPASE: Lipase: 37 U/L (ref 11.0–59.0)

## 2019-07-14 MED ORDER — METHOCARBAMOL 500 MG PO TABS: 500.0000 mg | ORAL_TABLET | Freq: Three times a day (TID) | ORAL | 0 refills | Status: DC | PRN

## 2019-07-14 NOTE — Patient Instructions (Signed)
Urine looking ok today Labs today Pending results we will proceed with imaging for further evaluation. Treat for now as muscular with heating pad to side, trial muscle relaxant to sleep.

## 2019-07-14 NOTE — Progress Notes (Signed)
This visit was conducted in person.  BP 134/80 (BP Location: Left Arm, Patient Position: Sitting, Cuff Size: Normal)   Pulse 75   Temp 97.9 F (36.6 C) (Temporal)   Ht 5' 5.75" (1.67 m)   Wt 147 lb 5 oz (66.8 kg)   SpO2 97%   BMI 23.96 kg/m    CC: abd pain Subjective:    Patient ID: Rachel Mcintosh, female    DOB: November 23, 1955, 63 y.o.   MRN: [id number]  HPI: Rachel Mcintosh is a 62 y.o. female presenting on 07/14/2019 for Abdominal Pain (C/o RLQ abd pain and low abd pressure.  Also, c/o right flank pain.  Sxs started about 3 wks ago. ) and Back Pain (C/o low back pain, bilateral. )   Caring for grandchildren and mother at the same time.   3 wk h/o RLQ and lower abd pain and pressure, R side, R flank pain and bilateral lower back pain. Occasional nausea after am coffee. Intermittent discomfort, initially improved after bowel movement. Now hurting more consistently throughout the day. Currently feeling uncomfortable pelvic pressure. No new rashes, no dysuria, vaginal discharge, fevers/chills, vomiting, diarrhea/constipation or blood in stool or urine. Ongoing bloating and indigestion. No significant GERD. No new foods, vitamins, supplements. Unable to sleep due to pain.   No h/o kidney stones.  Ongoing stress incontinence symptoms. Tylenol, ibuprofen haven't helped pain.   Wonders of pulled abdominal wall muscle from asymmetric sitting due to L leg amputation.   She was seen last year by urology for chronic cystitis symptoms (with negative UCx) - with normal cystoscopy. Recommended pelvic floor PT but she never completed.      Relevant past medical, surgical, family and social history reviewed and updated as indicated. Interim medical history since our last visit reviewed. Allergies and medications reviewed and updated. Outpatient Medications Prior to Visit  Medication Sig Dispense Refill  . albuterol (VENTOLIN HFA) 108 (90 Base) MCG/ACT inhaler INHALE 2 PUFFS INTO THE  LUNGS EVERY 4 HOURS AS NEEDED FOR WHEEZING OR SHORTNESS OF BREATH (COUGH, SHORTNESS OF BREATH OR WHEEZING.) 18 g 2  . Calcium Carbonate-Vitamin D3 (CALCIUM 600/VITAMIN D) 600-400 MG-UNIT TABS Take 1 tablet by mouth daily.    . cyanocobalamin (V-R VITAMIN B-12) 500 MCG tablet Take 1 tablet (500 mcg total) by mouth daily. (Patient taking differently: Take 500 mcg by mouth 3 (three) times a week. )    . fluticasone (FLONASE) 50 MCG/ACT nasal spray Place 2 sprays into both nostrils daily. (Patient taking differently: Place 2 sprays into both nostrils as needed. ) 16 g 6  . LORazepam (ATIVAN) 1 MG tablet Take 0.5-1 tablets (0.5-1 mg total) by mouth 2 (two) times daily as needed for anxiety. 30 tablet 5  . Naproxen Sodium (ALEVE PO) Take 1 tablet by mouth as needed.     No facility-administered medications prior to visit.      Per HPI unless specifically indicated in ROS section below Review of Systems Objective:    BP 134/80 (BP Location: Left Arm, Patient Position: Sitting, Cuff Size: Normal)   Pulse 75   Temp 97.9 F (36.6 C) (Temporal)   Ht 5' 5.75" (1.67 m)   Wt 147 lb 5 oz (66.8 kg)   SpO2 97%   BMI 23.96 kg/m   Wt Readings from Last 3 Encounters:  07/14/19 147 lb 5 oz (66.8 kg)  04/24/19 149 lb 1 oz (67.6 kg)  04/17/19 150 lb (68 kg)    Physical Exam Vitals signs  and nursing note reviewed.  Constitutional:      General: She is not in acute distress.    Appearance: Normal appearance. She is not ill-appearing.  HENT:     Mouth/Throat:     Mouth: Mucous membranes are moist.     Pharynx: Oropharynx is clear. No posterior oropharyngeal erythema.  Eyes:     Extraocular Movements: Extraocular movements intact.     Pupils: Pupils are equal, round, and reactive to light.  Cardiovascular:     Rate and Rhythm: Normal rate and regular rhythm.     Pulses: Normal pulses.     Heart sounds: Normal heart sounds. No murmur.  Pulmonary:     Effort: Pulmonary effort is normal. No  respiratory distress.     Breath sounds: Normal breath sounds. No wheezing, rhonchi or rales.  Abdominal:     General: Abdomen is flat. Bowel sounds are normal. There is no distension.     Palpations: Abdomen is soft. There is no mass.     Tenderness: There is abdominal tenderness. There is no right CVA tenderness, left CVA tenderness, guarding or rebound.     Hernia: No hernia is present.  Musculoskeletal:     Right lower leg: No edema.     Comments: L AKA  Skin:    General: Skin is warm and dry.     Capillary Refill: Capillary refill takes less than 2 seconds.     Findings: No erythema or rash.  Neurological:     Mental Status: She is alert.  Psychiatric:        Mood and Affect: Mood normal.        Behavior: Behavior normal.       Results for orders placed or performed in visit on 07/14/19  POCT Urinalysis Dipstick (Automated)  Result Value Ref Range   Color, UA light yellow    Clarity, UA clear    Glucose, UA Negative Negative   Bilirubin, UA negative    Ketones, UA negative    Spec Grav, UA 1.010 1.010 - 1.025   Blood, UA negative    pH, UA 6.0 5.0 - 8.0   Protein, UA Negative Negative   Urobilinogen, UA 0.2 0.2 or 1.0 E.U./dL   Nitrite, UA negative    Leukocytes, UA Negative Negative   Lab Results  Component Value Date   TSH 0.66 03/11/2016   Assessment & Plan:   Problem List Items Addressed This Visit    Right sided abdominal pain - Primary    Not consistent with colitis, appendicitis, gallstones. UA without signs of UTI or stone. Check labwork today (CBC, CMP, lipase).  ?muscle strain - treat with heating pad and robaxin muscle relaxant PRN.  Given duration and progression of symptoms, will check CT abd/pelvis to eval for cause of R sided abd pain.       Relevant Orders   Comprehensive metabolic panel   CBC with Differential   Lipase   CT Abdomen Pelvis W Contrast   Chronic cystitis    H/o this s/p reassuring cystoscopy 05/2018. She does have persistent  suprapubic pressure but UA today without signs of infection. Discussed possible PFPT.        Other Visit Diagnoses    Flank pain       Relevant Orders   POCT Urinalysis Dipstick (Automated) (Completed)   CT Abdomen Pelvis W Contrast       Meds ordered this encounter  Medications  . methocarbamol (ROBAXIN) 500 MG tablet  Sig: Take 1 tablet (500 mg total) by mouth every 8 (eight) hours as needed for muscle spasms.    Dispense:  30 tablet    Refill:  0   Orders Placed This Encounter  Procedures  . CT Abdomen Pelvis W Contrast    Standing Status:   Future    Standing Expiration Date:   10/13/2020    Order Specific Question:   ** REASON FOR EXAM (FREE TEXT)    Answer:   R sided abd pain x 3 wks    Order Specific Question:   If indicated for the ordered procedure, I authorize the administration of contrast media per Radiology protocol    Answer:   Yes    Order Specific Question:   Preferred imaging location?    Answer:   [facility]    Order Specific Question:   Is Oral Contrast requested for this exam?    Answer:   Yes, Per Radiology protocol    Order Specific Question:   Radiology Contrast Protocol - do NOT remove file path    Answer:   [email or web address]  . Comprehensive metabolic panel  . CBC with Differential  . Lipase  . POCT Urinalysis Dipstick (Automated)    Patient Instructions  Urine looking ok today Labs today Pending results we will proceed with imaging for further evaluation. Treat for now as muscular with heating pad to side, trial muscle relaxant to sleep.     Follow up plan: Return if symptoms worsen or fail to improve.  Ria Bush, MD

## 2019-07-14 NOTE — Assessment & Plan Note (Signed)
H/o this s/p reassuring cystoscopy 05/2018. She does have persistent suprapubic pressure but UA today without signs of infection. Discussed possible PFPT.

## 2019-07-14 NOTE — Assessment & Plan Note (Signed)
Not consistent with colitis, appendicitis, gallstones. UA without signs of UTI or stone. Check labwork today (CBC, CMP, lipase).  ?muscle strain - treat with heating pad and robaxin muscle relaxant PRN.  Given duration and progression of symptoms, will check CT abd/pelvis to eval for cause of R sided abd pain.

## 2019-07-20 ENCOUNTER — Ambulatory Visit (INDEPENDENT_AMBULATORY_CARE_PROVIDER_SITE_OTHER)
Admission: RE | Admit: 2019-07-20 | Discharge: 2019-07-20 | Disposition: A | Discharge status: Home / Self Care | Payer: Medicare HMO | Source: Ambulatory Visit | Attending: Family Medicine | Admitting: Family Medicine

## 2019-07-20 ENCOUNTER — Other Ambulatory Visit: Payer: Self-pay

## 2019-07-20 DIAGNOSIS — R109 Unspecified abdominal pain: Secondary | ICD-10-CM | POA: Diagnosis not present

## 2019-07-20 MED ORDER — IOHEXOL 300 MG/ML  SOLN
100.0000 mL | Freq: Once | INTRAMUSCULAR | Status: AC | PRN
  Administered 2019-07-20: 100 mL via INTRAVENOUS

## 2019-07-21 ENCOUNTER — Other Ambulatory Visit: Payer: Self-pay | Admitting: Family Medicine

## 2019-07-21 DIAGNOSIS — N281 Cyst of kidney, acquired: Secondary | ICD-10-CM

## 2019-07-21 DIAGNOSIS — R9389 Abnormal findings on diagnostic imaging of other specified body structures: Secondary | ICD-10-CM

## 2019-07-24 ENCOUNTER — Encounter: Payer: Self-pay | Admitting: Family Medicine

## 2019-07-24 ENCOUNTER — Ambulatory Visit (INDEPENDENT_AMBULATORY_CARE_PROVIDER_SITE_OTHER): Payer: Medicare HMO | Admitting: Family Medicine

## 2019-07-24 VITALS — Ht 65.75 in | Wt 147.0 lb

## 2019-07-24 DIAGNOSIS — T508X5A Adverse effect of diagnostic agents, initial encounter: Secondary | ICD-10-CM | POA: Insufficient documentation

## 2019-07-24 DIAGNOSIS — R21 Rash and other nonspecific skin eruption: Secondary | ICD-10-CM | POA: Diagnosis not present

## 2019-07-24 NOTE — Progress Notes (Signed)
Virtual visit attempted through [place], cmpleted through [employer]. Due to national recommendations of social distancing due to COVID-19, a virtual visit is felt to be most appropriate for this patient at this time. Reviewed limitations of a virtual visit.   Patient location: home Provider location: [place] at [facility], office If any vitals were documented, they were collected by patient at home unless specified below.    Ht 5' 5.75" (1.67 m)   Wt 147 lb (66.7 kg)   BMI 23.91 kg/m    CC: rash Subjective:    Patient ID: Rachel Mcintosh, female    DOB: April 14, 1956, 63 y.o.   MRN: [id number]  HPI: Rachel Mcintosh is a 63 y.o. female presenting on 07/24/2019 for Rash (C/o rash posterior arms and torso.  Denies any itching.  Noticed on 07/22/19.  Thinks it may due to contrast for CT on 07/20/19.  )   See recent imaging results for details.  Contrasted CT scan done Thursday 07/20/2019. Saturday morning noted red bumps on L wrist, then noticed raised bumps on abdomen into trunk and lower back - not really itchy. Not spreading, but also not resolving.  No fevers/chills, joint pains, oral lesions.  No new lotions, detergents, soaps shampoos. No new meds or supplements, vitamins, OTC remedies.   She did eat chimichanga from [employer] on Friday evening.         Relevant past medical, surgical, family and social history reviewed and updated as indicated. Interim medical history since our last visit reviewed. Allergies and medications reviewed and updated. Outpatient Medications Prior to Visit  Medication Sig Dispense Refill  . albuterol (VENTOLIN HFA) 108 (90 Base) MCG/ACT inhaler INHALE 2 PUFFS INTO THE LUNGS EVERY 4 HOURS AS NEEDED FOR WHEEZING OR SHORTNESS OF BREATH (COUGH, SHORTNESS OF BREATH OR WHEEZING.) 18 g 2  . Calcium Carbonate-Vitamin D3 (CALCIUM 600/VITAMIN D) 600-400 MG-UNIT TABS Take 1 tablet by mouth daily.    . cyanocobalamin (V-R VITAMIN B-12) 500 MCG tablet Take 1  tablet (500 mcg total) by mouth daily. (Patient taking differently: Take 500 mcg by mouth 3 (three) times a week. )    . fluticasone (FLONASE) 50 MCG/ACT nasal spray Place 2 sprays into both nostrils daily. (Patient taking differently: Place 2 sprays into both nostrils as needed. ) 16 g 6  . LORazepam (ATIVAN) 1 MG tablet Take 0.5-1 tablets (0.5-1 mg total) by mouth 2 (two) times daily as needed for anxiety. 30 tablet 5  . methocarbamol (ROBAXIN) 500 MG tablet Take 1 tablet (500 mg total) by mouth every 8 (eight) hours as needed for muscle spasms. 30 tablet 0  . Naproxen Sodium (ALEVE PO) Take 1 tablet by mouth as needed.     No facility-administered medications prior to visit.      Per HPI unless specifically indicated in ROS section below Review of Systems Objective:    Ht 5' 5.75" (1.67 m)   Wt 147 lb (66.7 kg)   BMI 23.91 kg/m   Wt Readings from Last 3 Encounters:  07/24/19 147 lb (66.7 kg)  07/14/19 147 lb 5 oz (66.8 kg)  04/24/19 149 lb 1 oz (67.6 kg)     Physical exam: Gen: alert, NAD, not ill appearing Pulm: speaks in complete sentences without increased work of breathing Psych: normal mood, normal thought content  Skin: raised bumps evident on L ventral wrist as well as on abdomen, with mild erythema to rash     CT Abdomen Pelvis W Contrast CLINICAL DATA:  Diffuse abdominal back pain for a few weeks. Right flank pain.  EXAM: CT ABDOMEN AND PELVIS WITH CONTRAST  TECHNIQUE: Multidetector CT imaging of the abdomen and pelvis was performed using the standard protocol following bolus administration of intravenous contrast.  CONTRAST:  138mL OMNIPAQUE IOHEXOL 300 MG/ML  SOLN  COMPARISON:  Insert CT a chest 07/26/2015  FINDINGS: Lower chest: Tree-in-bud opacity identified in the peripheral lower lobes bilaterally (see image 7/series 3). No pleural effusion.  Hepatobiliary: 5 mm hypoattenuating lesion lateral segment left liver (15/2) not definitely visible on  prior study but likely benign. Another 3 mm hypodensity in the tip of the lateral segment left liver on 18/2 was present previously. This is also most likely a benign process such as cyst. Small area of low attenuation in the anterior liver, adjacent to the falciform ligament, is in a characteristic location for focal fatty deposition. There is no evidence for gallstones, gallbladder wall thickening, or pericholecystic fluid. No intrahepatic or extrahepatic biliary dilation.  Pancreas: No focal mass lesion. No dilatation of the main duct. No intraparenchymal cyst. No peripancreatic edema.  Spleen: No splenomegaly. No focal mass lesion.  Adrenals/Urinary Tract: 14 mm right adrenal nodule is stable since 07/26/2015 consistent with benign etiology such is adenoma. Left adrenal gland unremarkable. Tiny cortical hypodensities in the right kidney are too small to characterize but are likely benign. Numerous low-density lesions of varying size are seen in the left kidney ranging from 2 mm up to 18 mm. Most of these approach water density and are likely cysts. 2 particular lesions have attenuation too high to be simple cysts, the larger measuring 8 mm in the lower pole and visible on 23/7 and the smaller in the posterior lower pole the left kidney on 19/7. No evidence for hydroureter. The urinary bladder appears normal for the degree of distention.  Stomach/Bowel: Stomach is unremarkable. No gastric wall thickening. No evidence of outlet obstruction. Duodenum is normally positioned as is the ligament of Treitz. No small bowel wall thickening. No small bowel dilatation. The appendix is not visualized, but there is no edema or inflammation in the region of the cecum. No gross colonic mass. No colonic wall thickening.  Vascular/Lymphatic: There is abdominal aortic atherosclerosis without aneurysm. IVC filter visualized in situ There is no gastrohepatic or hepatoduodenal ligament  lymphadenopathy. No intraperitoneal or retroperitoneal lymphadenopathy. No pelvic sidewall lymphadenopathy.  Reproductive: The uterus is surgically absent. There is no adnexal mass.  Other: No intraperitoneal free fluid.  Musculoskeletal: Status post lumbar fusion. No worrisome lytic or sclerotic osseous abnormality.  IMPRESSION: 1. No acute findings in the abdomen or pelvis. Specifically, no findings to explain the patient's history of right flank pain. 2. Left greater than right low-density renal lesions, compatible with cyst. 2 particular lesions in the left kidney have attenuation too high to be simple cysts but cannot be definitively characterized on today's study. Follow-up CT in 3 months could be used to ensure stability. Abdominal MRI with and without contrast may be able to definitively characterize these as complex cysts and preclude the need for additional follow-up imaging. 3. Tree-in-bud opacity identified both lower lobes consistent with atypical infection (including MAI). 4. 14 mm right adrenal nodule is stable since 07/26/2015 consistent with benign etiology such as adenoma. 5.  Aortic Atherosclerois (ICD10-170.0)  Electronically Signed   By: Misty Stanley M.D.   On: 07/20/2019 10:49   Assessment & Plan:   Problem List Items Addressed This Visit    Skin rash - Primary  Presumed delayed reaction to contrast from recent CT manifesting as skin rash 36 hours after contrast administration. Would classify current reaction as mild self limited reaction. Discussed PRN benadryl or pepcid use. Would likely not need premedication in the future given delayed nature of reaction.  Received 161mL OMNIPAQUE IOHEXOL 300 MG/ML SOLN.           No orders of the defined types were placed in this encounter.  No orders of the defined types were placed in this encounter.   I discussed the assessment and treatment plan with the patient. The patient was provided an opportunity  to ask questions and all were answered. The patient agreed with the plan and demonstrated an understanding of the instructions. The patient was advised to call back or seek an in-person evaluation if the symptoms worsen or if the condition fails to improve as anticipated.  Follow up plan: No follow-ups on file.  Ria Bush, MD

## 2019-07-24 NOTE — Assessment & Plan Note (Addendum)
Presumed delayed reaction to contrast from recent CT manifesting as skin rash 36 hours after contrast administration. Would classify current reaction as mild self limited reaction. Discussed PRN benadryl or pepcid use. Would likely not need premedication in the future given delayed nature of reaction.  Received 184mL OMNIPAQUE IOHEXOL 300 MG/ML SOLN.

## 2019-07-26 ENCOUNTER — Ambulatory Visit
Admission: RE | Admit: 2019-07-26 | Discharge: 2019-07-26 | Disposition: A | Discharge status: Home / Self Care | Payer: Medicare HMO | Source: Ambulatory Visit | Attending: Family Medicine | Admitting: Family Medicine

## 2019-07-26 DIAGNOSIS — R9389 Abnormal findings on diagnostic imaging of other specified body structures: Secondary | ICD-10-CM

## 2019-07-26 DIAGNOSIS — N281 Cyst of kidney, acquired: Secondary | ICD-10-CM

## 2019-07-31 ENCOUNTER — Ambulatory Visit: Payer: Medicare HMO | Admitting: Internal Medicine

## 2019-07-31 ENCOUNTER — Encounter: Payer: Self-pay | Admitting: Internal Medicine

## 2019-07-31 ENCOUNTER — Other Ambulatory Visit: Payer: Self-pay

## 2019-07-31 DIAGNOSIS — R0600 Dyspnea, unspecified: Secondary | ICD-10-CM

## 2019-07-31 DIAGNOSIS — R0609 Other forms of dyspnea: Secondary | ICD-10-CM

## 2019-07-31 DIAGNOSIS — R9389 Abnormal findings on diagnostic imaging of other specified body structures: Secondary | ICD-10-CM

## 2019-07-31 NOTE — Patient Instructions (Addendum)
You will need to isolate for the next 8 days or go ahead and get the COVID 19 test to be sure you don't have it before any further testing is done.  What you will need is a plain cxr and labs and a set of PFTs - call me if you wish to pursue this work up here or [place]   Only use your albuterol as a rescue medication to be used if you can't catch your breath by resting or doing a relaxed purse lip breathing pattern.  - The less you use it, the better it will work when you need it. - Ok to use up to 2 puffs  every 4 hours if you must but call for immediate appointment if use goes up over your usual need - Don't leave home without it !!  (think of it like the spare tire for your car)
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
## 2019-08-01 ENCOUNTER — Encounter: Payer: Self-pay | Admitting: Internal Medicine

## 2019-08-01 DIAGNOSIS — R0600 Dyspnea, unspecified: Secondary | ICD-10-CM | POA: Insufficient documentation

## 2019-08-01 DIAGNOSIS — R0602 Shortness of breath: Secondary | ICD-10-CM | POA: Insufficient documentation

## 2019-08-01 DIAGNOSIS — R0609 Other forms of dyspnea: Secondary | ICD-10-CM | POA: Insufficient documentation

## 2019-08-01 NOTE — Assessment & Plan Note (Addendum)
Quit smoking 2016  Echo 07/28/15 Left ventricle: The cavity size was normal. Wall thickness was   normal. Systolic function was vigorous. The estimated ejection   fraction was in the range of 65% to 70%. Wall motion was normal;   there were no regional wall motion abnormalities. Left   ventricular diastolic function parameters were normal. - Mitral valve: There was mild regurgitation. - Atrial septum: No defect or patent foramen ovale was identified. - Pulmonary arteries: PA peak pressure: 42 mm Hg (S). - Pericardium, extracardiac: A trivial pericardial effusion was   identified posterior to the heart. Improves p saba but clear lungs on 07/31/2019 6 h p last saba  - The proper method of use, as well as anticipated side effects, of a metered-dose inhaler are discussed and demonstrated to the patient.      When respiratory symptoms begin or become refractory well after a patient reports complete smoking cessation,  Especially when this wasn't the case while they were smoking, a red flag is raised based on the work of Dr Kris Mouton which states:  if you quit smoking when your best day FEV1 is still well preserved it is highly unlikely you will progress to severe disease.  That is to say, once the smoking stops,  the symptoms should not suddenly erupt or markedly worsen.  If so, the differential diagnosis should include  Obesity/deconditioning/anxiety/depression,  LPR/Reflux/Aspiration syndromes,  occult CHF, or  especially side effect of medications commonly used in this population none of which are listed.  She will need pfts and repeat echo to complete the w/u as well as walking study but due to concerns with covid19 would put this off for now and just use prn saba which apparently works fine for her and is reasonable as long as dependence does not crescendo and having no noct flares.   Would consider referral to our [place] office for her convenience.    Total time devoted to counseling   > 50 % of initial 60 min office visit:  reviewed case with pt/ performed device teaching  using a teach back technique which also  extended face to face time for this visit (see above) discussion of options/alternatives/ personally creating written customized instructions  in presence of pt  then going over those specific  Instructions directly with the pt including how to use all of the meds but in particular covering each new medication in detail and the difference between the maintenance= "automatic" meds and the prns using an action plan format for the latter (If this problem/symptom => do that organization reading Left to right).  Please see AVS from this visit for a full list of these instructions which I personally wrote for this pt and  are unique to this visit.

## 2019-08-01 NOTE — Assessment & Plan Note (Signed)
CT abd 11/2020Tree-in-bud opacities incidentally found on  ?atypical infection   Although there are clearly abnormalities on CT scan, they should probably be considered "microscopic" since not obvious on plain cxr scout film same day. They would not explain a dry cough or worsening sob which dates back approximately to the day of the scan.    In the setting of obvious "macroscopic" health issues (new sob, cough in setting of covid pandemic)   I am very reluctatnt to embark on an invasive w/u at this point but would rec consevative  follow up and in the meantime see what we can do to address the patient's subjective concerns.    W/u should include Plain cxr (since smoking hx could justify full non-contrast CT chest) Quant Ig's Quant gold TB ESR PFTs  Alpha one AT phenotype.  None of this should be undertaken with evolving sob or cough which has nothing to do with the ct findings and could be early covid 19 so rec either isolate x 10 days or go ahaed and have the Covid 19 pcr prior to scheduling any of the above  Discussed in detail all the  indications, usual  risks and alternatives  relative to the benefits with patient who was hesitant  to proceed with w/u as outlined and I suspect will request referral elsewhere based on her attitude after the above recs were made.   Therefore referred back to Dr Darnell Level.

## 2019-09-05 DIAGNOSIS — R69 Illness, unspecified: Secondary | ICD-10-CM | POA: Diagnosis not present

## 2019-09-14 ENCOUNTER — Encounter: Payer: Self-pay | Admitting: Internal Medicine

## 2019-09-14 ENCOUNTER — Ambulatory Visit (INDEPENDENT_AMBULATORY_CARE_PROVIDER_SITE_OTHER): Payer: Medicare HMO | Admitting: Internal Medicine

## 2019-09-14 ENCOUNTER — Other Ambulatory Visit: Payer: Self-pay

## 2019-09-14 VITALS — BP 128/84 | HR 80 | Temp 97.5°F | Wt 144.0 lb

## 2019-09-14 DIAGNOSIS — R6884 Jaw pain: Secondary | ICD-10-CM | POA: Diagnosis not present

## 2019-09-14 DIAGNOSIS — R69 Illness, unspecified: Secondary | ICD-10-CM | POA: Diagnosis not present

## 2019-09-14 DIAGNOSIS — H9202 Otalgia, left ear: Secondary | ICD-10-CM | POA: Diagnosis not present

## 2019-09-14 LAB — CBC
HCT: 41.8 % (ref 36.0–46.0)
Hemoglobin: 13.9 g/dL (ref 12.0–15.0)
MCHC: 33.3 g/dL (ref 30.0–36.0)
MCV: 83.9 fl (ref 78.0–100.0)
Platelets: 336 10*3/uL (ref 150.0–400.0)
RBC: 4.98 Mil/uL (ref 3.87–5.11)
RDW: 13 % (ref 11.5–15.5)
WBC: 6.4 10*3/uL (ref 4.0–10.5)

## 2019-09-14 MED ORDER — PREDNISONE 10 MG PO TABS: ORAL_TABLET | ORAL | 0 refills | Status: DC

## 2019-09-14 NOTE — Progress Notes (Signed)
Subjective:    Patient ID: Rachel Mcintosh, female    DOB: [birth year], 64 y.o.   MRN: [id number]  HPI  Pt presents to the clinic today with c/o left ear and jaw pain. This started 1 week ago after visiting the dentist for a filling on that side. She describes the pain as sharp. She reports some swelling of left jaw. The pain is worse when talking or eating. She denies nasal congestion, fever, or sore throat. Pain is not relieved by Ibuprofen or heat. She reports minimal relief with ice. She saw the dentist today for follow up and was told that there does not appear to be a cause of her pain from the dental procedure.  Review of Systems      Past Medical History:  Diagnosis Date  . Abdominal aortic atherosclerosis ([place]) 01/2016   by xray  . Above knee amputation of left lower extremity ([place]) 2008   disability since then  . CAP (community acquired pneumonia) 07/27/2015  . Cervical spine fracture ([place]) 2008   hx of   . DDD (degenerative disc disease), lumbar 2007   s/p surgery  . Ex-smoker   . GERD (gastroesophageal reflux disease)    doing well off meds  . History of anxiety    situational/stress induced, previously on paxil  . Hyperlipidemia    controlled by diet  . Osteopenia 01/2016   by xray  . Presence of IVC filter 2008   Touched base with IR - she has permanent steen greenfield filter. Will need to check with MRI tech if she ever needs MRI done.   . Renal cysts, acquired, bilateral 2008   by CT rec renal US  . Urine incontinence    "difficulty holding urine"    Current Outpatient Medications  Medication Sig Dispense Refill  . albuterol (VENTOLIN HFA) 108 (90 Base) MCG/ACT inhaler INHALE 2 PUFFS INTO THE LUNGS EVERY 4 HOURS AS NEEDED FOR WHEEZING OR SHORTNESS OF BREATH (COUGH, SHORTNESS OF BREATH OR WHEEZING.) 18 g 2  . Calcium Carbonate-Vitamin D3 (CALCIUM 600/VITAMIN D) 600-400 MG-UNIT TABS Take 1 tablet by mouth daily.    . cyanocobalamin (V-R VITAMIN B-12) 500  MCG tablet Take 1 tablet (500 mcg total) by mouth daily. (Patient taking differently: Take 500 mcg by mouth 3 (three) times a week. )    . fluticasone (FLONASE) 50 MCG/ACT nasal spray Place 2 sprays into both nostrils daily. (Patient taking differently: Place 2 sprays into both nostrils as needed. ) 16 g 6  . LORazepam (ATIVAN) 1 MG tablet Take 0.5-1 tablets (0.5-1 mg total) by mouth 2 (two) times daily as needed for anxiety. 30 tablet 5  . methocarbamol (ROBAXIN) 500 MG tablet Take 1 tablet (500 mg total) by mouth every 8 (eight) hours as needed for muscle spasms. 30 tablet 0  . Naproxen Sodium (ALEVE PO) Take 1 tablet by mouth as needed.     No current facility-administered medications for this visit.    Allergies  Allergen Reactions  . Codeine Nausea And Vomiting  . Doxycycline Other (See Comments)    Worsened GERD  . Augmentin [Amoxicillin-Pot Clavulanate] Nausea Only and Other (See Comments)    HA and nausea  . Iohexol Rash    Delayed reaction to iodinated contrast manifesting as skin rash 36 hours after contrast administration  . Sulfa Antibiotics Other (See Comments)    Unsure of allergy    Family History  Problem Relation Age of Onset  . Hyperlipidemia Mother   .  Hyperlipidemia Father   . Prostate cancer Paternal Grandfather   . Cancer Other        breast (great grandmother)  . CAD Maternal Uncle 40       MI  . Breast cancer Maternal Aunt   . Stroke Neg Hx   . Diabetes Neg Hx     Social History   Socioeconomic History  . Marital status: Single    Spouse name: Not on file  . Number of children: 2  . Years of education: Not on file  . Highest education level: Not on file  Occupational History  . Occupation: disability  Tobacco Use  . Smoking status: Former Smoker    Packs/day: 0.75    Years: 40.00    Pack years: 30.00    Types: Cigarettes    Quit date: 08/01/2015    Years since quitting: 4.1  . Smokeless tobacco: Never Used  . Tobacco comment: pt declined    Substance and Sexual Activity  . Alcohol use: Not Currently    Alcohol/week: 0.0 standard drinks    Comment: rare  . Drug use: No  . Sexual activity: Not Currently  Other Topics Concern  . Not on file  Social History Narrative   Lives alone   Edu: HS   Occupation: disability since [place] 2008, was supervisor at [employer]   Activity: no regular exercise   Diet: some water, fruits daily   Social Determinants of Health   Financial Resource Strain: Low Risk   . Difficulty of Paying Living Expenses: Not hard at all  Food Insecurity: No Food Insecurity  . Worried About [employer] in the Last Year: Never true  . Ran Out of Food in the Last Year: Never true  Transportation Needs: No Transportation Needs  . Lack of Transportation (Medical): No  . Lack of Transportation (Non-Medical): No  Physical Activity: Inactive  . Days of Exercise per Week: 0 days  . Minutes of Exercise per Session: 0 min  Stress: Stress Concern Present  . Feeling of Stress : To some extent  Social Connections:   . Frequency of Communication with Friends and Family: Not on file  . Frequency of Social Gatherings with Friends and Family: Not on file  . Attends Religious Services: Not on file  . Active Member of Clubs or Organizations: Not on file  . Attends Archivist Meetings: Not on file  . Marital Status: Not on file  Intimate Partner Violence: Not At Risk  . Fear of Current or Ex-Partner: No  . Emotionally Abused: No  . Physically Abused: No  . Sexually Abused: No     Constitutional: Denies fever, malaise, fatigue, or headache.  HEENT: Pt reports left ear pain.. Denies eye pain, eye redness, ringing in the ears, wax buildup, runny nose, nasal congestion, bloody nose, or sore throat. Respiratory: Denies difficulty breathing, shortness of breath, cough or sputum production.   Cardiovascular: Denies chest pain, chest tightness, palpitations or swelling in the hands or feet.  Skin:  Denies redness, rashes, lesions or ulcercations.  MSK: Pt reports left side jaw pain. Denies joint swelling.  Neurological: Denies dizziness, difficulty with memory, difficulty with speech or problems with balance and coordination.    No other specific complaints in a complete review of systems (except as listed in HPI above).  Objective:   Physical Exam  BP 128/84   Pulse 80   Temp (!) 97.5 F (36.4 C) (Temporal)   Wt 144 lb (65.3  kg)   SpO2 98%   BMI 23.42 kg/m \ Wt Readings from Last 3 Encounters:  07/31/19 144 lb 3.2 oz (65.4 kg)  07/24/19 147 lb (66.7 kg)  07/14/19 147 lb 5 oz (66.8 kg)    General: Appears her stated age, in NAD. Skin: Warm, dry and intact. No rashes noted. HEENT: Head: normal shape and size;  Ears: Tm's gray and intact, normal light reflex; Throat/Mouth: Teeth present, mucosa pink and moist, no exudate, lesions or ulcerations noted.  Neck:  No adenopathy noted. Pulmonary/Chest: Normal effort. No respiratory distress.  MSK: Pain with palpation underneath the left mandible. No swelling noted. Neurological: Alert and oriented.    BMET    Component Value Date/Time   NA 140 07/14/2019 1151   K 3.6 07/14/2019 1151   CL 104 07/14/2019 1151   CO2 29 07/14/2019 1151   GLUCOSE 102 (H) 07/14/2019 1151   BUN 7 07/14/2019 1151   CREATININE 0.66 07/14/2019 1151   CALCIUM 9.8 07/14/2019 1151   GFRNONAA >60 07/30/2015 0300   GFRAA >60 07/30/2015 0300    Lipid Panel     Component Value Date/Time   CHOL 238 (H) 04/17/2019 0946   TRIG 95.0 04/17/2019 0946   HDL 66.70 04/17/2019 0946   CHOLHDL 4 04/17/2019 0946   VLDL 19.0 04/17/2019 0946   LDLCALC 152 (H) 04/17/2019 0946    CBC    Component Value Date/Time   WBC 5.2 07/14/2019 1151   RBC 4.86 07/14/2019 1151   HGB 13.8 07/14/2019 1151   HCT 40.7 07/14/2019 1151   PLT 306.0 07/14/2019 1151   MCV 83.8 07/14/2019 1151   MCH 28.8 07/30/2015 0300   MCHC 34.0 07/14/2019 1151   RDW 13.3 07/14/2019  1151   LYMPHSABS 1.9 07/14/2019 1151   MONOABS 0.4 07/14/2019 1151   EOSABS 0.1 07/14/2019 1151   BASOSABS 0.1 07/14/2019 1151    Hgb A1C No results found for: HGBA1C          Assessment & Plan:  Otalgia, Left, Left Side Jaw Pain:  Rx sent for Prednisone 10mg  TID x2 days, BID x2 days, QD x2 days.  Patient advised not to take Ibuprofen while taking Prednisone.  CBC ordered to r/o infection Continue icing PRN.   RTC if symptoms persist with conclusion of Prednisone.  Webb Silversmith, NP This visit occurred during the SARS-CoV-2 public health emergency.  Safety protocols were in place, including screening questions prior to the visit, additional usage of staff PPE, and extensive cleaning of exam room while observing appropriate contact time as indicated for disinfecting solutions.

## 2019-09-20 ENCOUNTER — Other Ambulatory Visit: Payer: Self-pay

## 2019-09-20 ENCOUNTER — Ambulatory Visit (INDEPENDENT_AMBULATORY_CARE_PROVIDER_SITE_OTHER): Payer: Medicare HMO | Admitting: Internal Medicine

## 2019-09-20 ENCOUNTER — Encounter: Payer: Self-pay | Admitting: Internal Medicine

## 2019-09-20 VITALS — BP 124/82 | HR 81 | Temp 97.8°F | Wt 143.0 lb

## 2019-09-20 DIAGNOSIS — R519 Headache, unspecified: Secondary | ICD-10-CM | POA: Diagnosis not present

## 2019-09-20 DIAGNOSIS — K148 Other diseases of tongue: Secondary | ICD-10-CM | POA: Diagnosis not present

## 2019-09-20 MED ORDER — VALACYCLOVIR HCL 1 G PO TABS: 1000.0000 mg | ORAL_TABLET | Freq: Three times a day (TID) | ORAL | 0 refills | Status: DC

## 2019-09-20 MED ORDER — GABAPENTIN 100 MG PO CAPS: 100.0000 mg | ORAL_CAPSULE | Freq: Three times a day (TID) | ORAL | 0 refills | Status: DC

## 2019-09-20 NOTE — Patient Instructions (Signed)
Shingles  Shingles is an infection. It gives you a painful skin rash and blisters that have fluid in them. Shingles is caused by the same germ (virus) that causes chickenpox. Shingles only happens in people who:  Have had chickenpox.  Have been given a shot of medicine (vaccine) to protect against chickenpox. Shingles is rare in this group. The first symptoms of shingles may be itching, tingling, or pain in an area on your skin. A rash will show on your skin a few days or weeks later. The rash is likely to be on one side of your body. The rash usually has a shape like a belt or a band. Over time, the rash turns into fluid-filled blisters. The blisters will break open, change into scabs, and dry up. Medicines may:  Help with pain and itching.  Help you get better sooner.  Help to prevent long-term problems. Follow these instructions at home: Medicines  Take over-the-counter and prescription medicines only as told by your doctor.  Put on an anti-itch cream or numbing cream where you have a rash, blisters, or scabs. Do this as told by your doctor. Helping with itching and discomfort   Put cold, wet cloths (cold compresses) on the area of the rash or blisters as told by your doctor.  Cool baths can help you feel better. Try adding baking soda or dry oatmeal to the water to lessen itching. Do not bathe in hot water. Blister and rash care  Keep your rash covered with a loose bandage (dressing).  Wear loose clothing that does not rub on your rash.  Keep your rash and blisters clean. To do this, wash the area with mild soap and cool water as told by your doctor.  Check your rash every day for signs of infection. Check for: ? More redness, swelling, or pain. ? Fluid or blood. ? Warmth. ? Pus or a bad smell.  Do not scratch your rash. Do not pick at your blisters. To help you to not scratch: ? Keep your fingernails clean and cut short. ? Wear gloves or mittens when you sleep, if  scratching is a problem. General instructions  Rest as told by your doctor.  Keep all follow-up visits as told by your doctor. This is important.  Wash your hands often with soap and water. If soap and water are not available, use hand sanitizer. Doing this lowers your chance of getting a skin infection caused by germs (bacteria).  Your infection can cause chickenpox in people who have never had chickenpox or never got a shot of chickenpox vaccine. If you have blisters that did not change into scabs yet, try not to touch other people or be around other people, especially: ? Babies. ? Pregnant women. ? Children who have areas of red, itchy, or rough skin (eczema). ? Very old people who have transplants. ? People who have a long-term (chronic) sickness, like cancer or AIDS. Contact a doctor if:  Your pain does not get better with medicine.  Your pain does not get better after the rash heals.  You have any signs of infection in the rash area. These signs include: ? More redness, swelling, or pain around the rash. ? Fluid or blood coming from the rash. ? The rash area feeling warm to the touch. ? Pus or a bad smell coming from the rash. Get help right away if:  The rash is on your face or nose.  You have pain in your face or pain by   your eye.  You lose feeling on one side of your face.  You have trouble seeing.  You have ear pain, or you have ringing in your ear.  You have a loss of taste.  Your condition gets worse. Summary  Shingles gives you a painful skin rash and blisters that have fluid in them.  Shingles is an infection. It is caused by the same germ (virus) that causes chickenpox.  Keep your rash covered with a loose bandage (dressing). Wear loose clothing that does not rub on your rash.  If you have blisters that did not change into scabs yet, try not to touch other people or be around people. This information is not intended to replace advice given to you by  your health care provider. Make sure you discuss any questions you have with your health care provider. Document Revised: 12/09/2018 Document Reviewed: 04/21/2017 [employer]vier Patient Education  2020 [employer]  

## 2019-09-20 NOTE — Progress Notes (Signed)
Subjective:    Patient ID: Rachel Mcintosh, female    DOB: 1956/08/15, 64 y.o.   MRN: [id number]  HPI  Pt presents to the clinic today left side facial pain. This has been an ongoing issue for 13 days. She was seen 1/14 for the same, given a RX for Prednisone and advised to ice the area which has provided minimal relief. She saw a dentist 6 days ago and with imaging. A dental cause was ruled out. She reports that the left side of her tongue and left side of throat now have pain as well and that a red bump formed under her tongue yesterday. She describes the pain as sharp and aching. The pain is worse with talking and chewing. She denies fever, congestion, or cough. She has not noticed any rashes. No one in her home has similar symptoms. She has tried Flonase OTC with minimal relief.  Review of Systems  Past Medical History:  Diagnosis Date  . Abdominal aortic atherosclerosis ([place]) 01/2016   by xray  . Above knee amputation of left lower extremity ([place]) 2008   disability since then  . CAP (community acquired pneumonia) 07/27/2015  . Cervical spine fracture ([place]) 2008   hx of   . DDD (degenerative disc disease), lumbar 2007   s/p surgery  . Ex-smoker   . GERD (gastroesophageal reflux disease)    doing well off meds  . History of anxiety    situational/stress induced, previously on paxil  . Hyperlipidemia    controlled by diet  . Osteopenia 01/2016   by xray  . Presence of IVC filter 2008   Touched base with IR - she has permanent steen greenfield filter. Will need to check with MRI tech if she ever needs MRI done.   . Renal cysts, acquired, bilateral 2008   by CT rec renal US  . Urine incontinence    "difficulty holding urine"    Current Outpatient Medications  Medication Sig Dispense Refill  . albuterol (VENTOLIN HFA) 108 (90 Base) MCG/ACT inhaler INHALE 2 PUFFS INTO THE LUNGS EVERY 4 HOURS AS NEEDED FOR WHEEZING OR SHORTNESS OF BREATH (COUGH, SHORTNESS OF BREATH OR  WHEEZING.) 18 g 2  . Calcium Carbonate-Vitamin D3 (CALCIUM 600/VITAMIN D) 600-400 MG-UNIT TABS Take 1 tablet by mouth daily.    . cyanocobalamin (V-R VITAMIN B-12) 500 MCG tablet Take 1 tablet (500 mcg total) by mouth daily. (Patient taking differently: Take 500 mcg by mouth 3 (three) times a week. )    . fluticasone (FLONASE) 50 MCG/ACT nasal spray Place 2 sprays into both nostrils daily. (Patient taking differently: Place 2 sprays into both nostrils as needed. ) 16 g 6  . LORazepam (ATIVAN) 1 MG tablet Take 0.5-1 tablets (0.5-1 mg total) by mouth 2 (two) times daily as needed for anxiety. 30 tablet 5  . methocarbamol (ROBAXIN) 500 MG tablet Take 1 tablet (500 mg total) by mouth every 8 (eight) hours as needed for muscle spasms. 30 tablet 0  . Naproxen Sodium (ALEVE PO) Take 1 tablet by mouth as needed.    . predniSONE (DELTASONE) 10 MG tablet Take 3 tabs on days 1-2, take 2 tabs on days 3-4, take 1 tab on days 5-6 12 tablet 0   No current facility-administered medications for this visit.    Allergies  Allergen Reactions  . Codeine Nausea And Vomiting  . Doxycycline Other (See Comments)    Worsened GERD  . Augmentin [Amoxicillin-Pot Clavulanate] Nausea Only and Other (See  Comments)    HA and nausea  . Iohexol Rash    Delayed reaction to iodinated contrast manifesting as skin rash 36 hours after contrast administration  . Sulfa Antibiotics Other (See Comments)    Unsure of allergy    Family History  Problem Relation Age of Onset  . Hyperlipidemia Mother   . Hyperlipidemia Father   . Prostate cancer Paternal Grandfather   . Cancer Other        breast (great grandmother)  . CAD Maternal Uncle 40       MI  . Breast cancer Maternal Aunt   . Stroke Neg Hx   . Diabetes Neg Hx     Social History   Socioeconomic History  . Marital status: Single    Spouse name: Not on file  . Number of children: 2  . Years of education: Not on file  . Highest education level: Not on file    Occupational History  . Occupation: disability  Tobacco Use  . Smoking status: Former Smoker    Packs/day: 0.75    Years: 40.00    Pack years: 30.00    Types: Cigarettes    Quit date: 08/01/2015    Years since quitting: 4.1  . Smokeless tobacco: Never Used  . Tobacco comment: pt declined  Substance and Sexual Activity  . Alcohol use: Not Currently    Alcohol/week: 0.0 standard drinks    Comment: rare  . Drug use: No  . Sexual activity: Not Currently  Other Topics Concern  . Not on file  Social History Narrative   Lives alone   Edu: HS   Occupation: disability since [place] 2008, was supervisor at [employer]   Activity: no regular exercise   Diet: some water, fruits daily   Social Determinants of Health   Financial Resource Strain: Low Risk   . Difficulty of Paying Living Expenses: Not hard at all  Food Insecurity: No Food Insecurity  . Worried About [employer] in the Last Year: Never true  . Ran Out of Food in the Last Year: Never true  Transportation Needs: No Transportation Needs  . Lack of Transportation (Medical): No  . Lack of Transportation (Non-Medical): No  Physical Activity: Inactive  . Days of Exercise per Week: 0 days  . Minutes of Exercise per Session: 0 min  Stress: Stress Concern Present  . Feeling of Stress : To some extent  Social Connections:   . Frequency of Communication with Friends and Family: Not on file  . Frequency of Social Gatherings with Friends and Family: Not on file  . Attends Religious Services: Not on file  . Active Member of Clubs or Organizations: Not on file  . Attends Archivist Meetings: Not on file  . Marital Status: Not on file  Intimate Partner Violence: Not At Risk  . Fear of Current or Ex-Partner: No  . Emotionally Abused: No  . Physically Abused: No  . Sexually Abused: No     Constitutional: Denies fever, malaise, fatigue, headache or abrupt weight changes.  HEENT: Pt reports left side facial pain,  oral lesion. Denies eye pain, eye redness, ringing in the ears, wax buildup, runny nose, nasal congestion, or bloody nose. Respiratory: Denies difficulty breathing, shortness of breath, cough or sputum production.   Cardiovascular: Denies chest pain, chest tightness, palpitations or swelling in the hands or feet.  Skin: Denies redness, rashes, lesions or ulcercations.  Neurological: Denies dizziness, difficulty with memory, difficulty with speech or  problems with balance and coordination.   No other specific complaints in a complete review of systems (except as listed in HPI above).     Objective:   Physical Exam  BP 124/82   Pulse 81   Temp 97.8 F (36.6 C) (Temporal)   Wt 143 lb (64.9 kg)   SpO2 98%   BMI 23.26 kg/m   Wt Readings from Last 3 Encounters:  09/14/19 144 lb (65.3 kg)  07/31/19 144 lb 3.2 oz (65.4 kg)  07/24/19 147 lb (66.7 kg)    General: Appears her stated age, well developed, well nourished, in NAD. Skin: Warm, dry and intact. No rashes, lesions or ulcerations noted on the left side of the face/head. HEENT: Head: normal shape and size; Eyes: sclera white, no icterus, conjunctiva pink, PERRLA and EOMs intact; Ears: Tm's gray and intact, normal light reflex; Throat/Mouth: solitary 58mm red papule on mouth floor, teeth present but tender to touch, mucosa moist, no exudate.  Neck:  Tender to palpation in superior left neck but no swelling noted. Neck supple, trachea midline. Neurological: Alert and oriented. Cranial nerves II-XII grossly intact. Psychiatric: Mood and affect normal. Behavior is normal. Judgment and thought content normal.    BMET    Component Value Date/Time   NA 140 07/14/2019 1151   K 3.6 07/14/2019 1151   CL 104 07/14/2019 1151   CO2 29 07/14/2019 1151   GLUCOSE 102 (H) 07/14/2019 1151   BUN 7 07/14/2019 1151   CREATININE 0.66 07/14/2019 1151   CALCIUM 9.8 07/14/2019 1151   GFRNONAA >60 07/30/2015 0300   GFRAA >60 07/30/2015 0300     Lipid Panel     Component Value Date/Time   CHOL 238 (H) 04/17/2019 0946   TRIG 95.0 04/17/2019 0946   HDL 66.70 04/17/2019 0946   CHOLHDL 4 04/17/2019 0946   VLDL 19.0 04/17/2019 0946   LDLCALC 152 (H) 04/17/2019 0946    CBC    Component Value Date/Time   WBC 6.4 09/14/2019 1444   RBC 4.98 09/14/2019 1444   HGB 13.9 09/14/2019 1444   HCT 41.8 09/14/2019 1444   PLT 336.0 09/14/2019 1444   MCV 83.9 09/14/2019 1444   MCH 28.8 07/30/2015 0300   MCHC 33.3 09/14/2019 1444   RDW 13.0 09/14/2019 1444   LYMPHSABS 1.9 07/14/2019 1151   MONOABS 0.4 07/14/2019 1151   EOSABS 0.1 07/14/2019 1151   BASOSABS 0.1 07/14/2019 1151    Hgb A1C No results found for: HGBA1C         Assessment & Plan:   Left Side Facial Pain, Tongue Lesion:  Concerning for shingles without a rash. Rx sent for Gabapentin 100mg  TID x10 days Rx sent for Valtrex 1000mg  TID x7 days Will check Varicella zoster antibody IgM today.  RTC if symptoms persist or worsen   Webb Silversmith, NP. This visit occurred during the SARS-CoV-2 public health emergency.  Safety protocols were in place, including screening questions prior to the visit, additional usage of staff PPE, and extensive cleaning of exam room while observing appropriate contact time as indicated for disinfecting solutions.

## 2019-09-22 ENCOUNTER — Telehealth: Payer: Self-pay | Admitting: Family Medicine

## 2019-09-22 LAB — VARICELLA ZOSTER ANTIBODY, IGM: Varicella Zoster Ab IgM: <=0.9 (ref <=0.90)

## 2019-09-22 NOTE — Telephone Encounter (Signed)
Spoke with pt to relay recent lab results.  States she was finally able to see them in her MyChart but expresses her thanks for the call.

## 2019-09-22 NOTE — Telephone Encounter (Signed)
Patient ca regards to her lab results. She stated she doesn't see them on her my chart and would like a call back to see if we have those

## 2019-09-29 DIAGNOSIS — R69 Illness, unspecified: Secondary | ICD-10-CM | POA: Diagnosis not present

## 2019-10-06 DIAGNOSIS — H52213 Irregular astigmatism, bilateral: Secondary | ICD-10-CM | POA: Diagnosis not present

## 2019-10-06 DIAGNOSIS — H04123 Dry eye syndrome of bilateral lacrimal glands: Secondary | ICD-10-CM | POA: Diagnosis not present

## 2019-10-06 DIAGNOSIS — H0288A Meibomian gland dysfunction right eye, upper and lower eyelids: Secondary | ICD-10-CM | POA: Diagnosis not present

## 2019-10-06 DIAGNOSIS — H5213 Myopia, bilateral: Secondary | ICD-10-CM | POA: Diagnosis not present

## 2019-10-06 DIAGNOSIS — H524 Presbyopia: Secondary | ICD-10-CM | POA: Diagnosis not present

## 2019-10-06 DIAGNOSIS — H0288B Meibomian gland dysfunction left eye, upper and lower eyelids: Secondary | ICD-10-CM | POA: Diagnosis not present

## 2019-10-18 ENCOUNTER — Encounter: Payer: Self-pay | Admitting: Family Medicine

## 2019-10-18 ENCOUNTER — Ambulatory Visit (INDEPENDENT_AMBULATORY_CARE_PROVIDER_SITE_OTHER): Payer: Medicare HMO | Admitting: Family Medicine

## 2019-10-18 ENCOUNTER — Other Ambulatory Visit: Payer: Self-pay

## 2019-10-18 VITALS — BP 124/78 | HR 81 | Temp 97.9°F | Ht 65.75 in | Wt 141.1 lb

## 2019-10-18 DIAGNOSIS — N281 Cyst of kidney, acquired: Secondary | ICD-10-CM | POA: Diagnosis not present

## 2019-10-18 DIAGNOSIS — R109 Unspecified abdominal pain: Secondary | ICD-10-CM | POA: Diagnosis not present

## 2019-10-18 DIAGNOSIS — D3501 Benign neoplasm of right adrenal gland: Secondary | ICD-10-CM | POA: Diagnosis not present

## 2019-10-18 NOTE — Progress Notes (Signed)
This visit was conducted in person.  BP 124/78 (BP Location: Left Arm, Patient Position: Sitting, Cuff Size: Normal)   Pulse 81   Temp 97.9 F (36.6 C) (Temporal)   Ht 5' 5.75" (1.67 m)   Wt 141 lb 2 oz (64 kg)   SpO2 97%   BMI 22.95 kg/m    CC: abd pain f/u Subjective:    Patient ID: Rachel Mcintosh, female    DOB: [birth year], 64 y.o.   MRN: [id number]  HPI: [name] is a 64 y.o. female presenting on 10/18/2019 for Abdominal Pain (States RUQ abd pain had improved.  Pain returned on 10/11/19.  Also, c/o bloating, abd gurgling, pain in upper mid chest and belching. )   See prior note for details. CT scan and renal US largely reassuring. She did have L complex kidney cysts - rec rpt imaging in 2-3 months.   Possible lung infection - saw pulm, no treatment at this time. Did make recs for further workup which will be postponed at this time.    Prior R sided abd pain (predominantly RLQ) had fully resolved for 3 months, now recurred over the past week - describes RUQ discomfort associated with bloating, mid chest pain, belching. She already avoids fatty greasy foods. Food feels like it gets stuck in mid chest. Upper abdominal pressure worse with bending forward. Worse with laying on right side, better when supine. Describes RUQ pain as gradually worsening over hours then slowly resolves. Today symptoms are again improved.   No chest tightness, pressure, new dyspnea, diaphoresis, nausea.   She tried GasX and miralax with mild benefit.   Seen for possible shingles without rash to L side of face - treated with valtrex, gabapentin. Varicella IgM negative.   Chronic cystitis followed by urology.      Relevant past medical, surgical, family and social history reviewed and updated as indicated. Interim medical history since our last visit reviewed. Allergies and medications reviewed and updated. Outpatient Medications Prior to Visit  Medication Sig Dispense Refill  . albuterol  (VENTOLIN HFA) 108 (90 Base) MCG/ACT inhaler INHALE 2 PUFFS INTO THE LUNGS EVERY 4 HOURS AS NEEDED FOR WHEEZING OR SHORTNESS OF BREATH (COUGH, SHORTNESS OF BREATH OR WHEEZING.) 18 g 2  . Calcium Carbonate-Vitamin D3 (CALCIUM 600/VITAMIN D) 600-400 MG-UNIT TABS Take 1 tablet by mouth daily.    . cyanocobalamin (V-R VITAMIN B-12) 500 MCG tablet Take 1 tablet (500 mcg total) by mouth daily. (Patient taking differently: Take 500 mcg by mouth 3 (three) times a week. )    . fluticasone (FLONASE) 50 MCG/ACT nasal spray Place 2 sprays into both nostrils daily. (Patient taking differently: Place 2 sprays into both nostrils as needed. ) 16 g 6  . LORazepam (ATIVAN) 1 MG tablet Take 0.5-1 tablets (0.5-1 mg total) by mouth 2 (two) times daily as needed for anxiety. 30 tablet 5  . methocarbamol (ROBAXIN) 500 MG tablet Take 1 tablet (500 mg total) by mouth every 8 (eight) hours as needed for muscle spasms. 30 tablet 0  . Naproxen Sodium (ALEVE PO) Take 1 tablet by mouth as needed.    . gabapentin (NEURONTIN) 100 MG capsule Take 1 capsule (100 mg total) by mouth 3 (three) times daily. 30 capsule 0  . valACYclovir (VALTREX) 1000 MG tablet Take 1 tablet (1,000 mg total) by mouth 3 (three) times daily. 21 tablet 0   No facility-administered medications prior to visit.     Per HPI unless specifically indicated in  ROS section below Review of Systems Objective:    BP 124/78 (BP Location: Left Arm, Patient Position: Sitting, Cuff Size: Normal)   Pulse 81   Temp 97.9 F (36.6 C) (Temporal)   Ht 5' 5.75" (1.67 m)   Wt 141 lb 2 oz (64 kg)   SpO2 97%   BMI 22.95 kg/m   Wt Readings from Last 3 Encounters:  10/18/19 141 lb 2 oz (64 kg)  09/20/19 143 lb (64.9 kg)  09/14/19 144 lb (65.3 kg)    Physical Exam Vitals and nursing note reviewed.  Constitutional:      Appearance: Normal appearance. She is not ill-appearing.  Cardiovascular:     Rate and Rhythm: Normal rate.     Pulses: Normal pulses.     Heart  sounds: Normal heart sounds. No murmur.  Pulmonary:     Effort: Pulmonary effort is normal. No respiratory distress.     Breath sounds: Normal breath sounds. No wheezing, rhonchi or rales.  Abdominal:     General: Abdomen is flat. Bowel sounds are normal. There is no distension.     Palpations: Abdomen is soft. There is no mass.     Tenderness: There is abdominal tenderness (mild-mod) in the right upper quadrant and epigastric area. There is no right CVA tenderness, left CVA tenderness, guarding or rebound. Negative signs include Murphy's sign.     Hernia: No hernia is present.  Musculoskeletal:     Right lower leg: No edema.     Comments: L AKA  Skin:    Findings: No rash.  Neurological:     Mental Status: She is alert.       Results for orders placed or performed in visit on 09/20/19  Varicella zoster antibody, IgM  Result Value Ref Range   Varicella Zoster Ab IgM <=0.90 <=0.90   US Renal CLINICAL DATA:  Bilateral renal cysts, left greater than right. Abnormal findings on CT scan.  EXAM: RENAL / URINARY TRACT ULTRASOUND COMPLETE  COMPARISON:  CT 07/20/2019  FINDINGS: Right Kidney:  Renal measurements: 9.6 x 4.2 x 4.7 cm = volume: 100 mL. Small subcentimeter cysts on CT are not resolved sonographically. No solid mass or hydronephrosis visualized.Echogenicity within normal limits.  Left Kidney:  Renal measurements: 9.7 x 3.9 x 4.1 cm = volume: 82 mL. Multiple cysts in the left kidney. In the upper pole there is a 1.5 x 1.4 x 1.4 cm cyst, as well as a 1.3 x 1.3 x 1.5 cm cyst. In the mid kidney is a 1.5 x 1.2 x 1.2 cm cyst. Many of the additional lesions on CT are not visualized sonographically due to size. Renal parenchymal echogenicity is normal. No hydronephrosis.  Bladder:  Appears normal for degree of bladder distention.  Other:  None.  IMPRESSION: 1. Multiple cysts in the left kidney. Please note that many of the lesions on CT scan, including the 2  lesions that were not definitively simple cysts by CT, are not visualized by ultrasound and cannot further characterized. Abdominal MRI with and without contrast may be able to definitively characterize these lesions. 2. Small cysts in the right kidney on CT are not visualized by ultrasound.  Electronically Signed   By: Keith Rake M.D.   On: 07/26/2019 14:25   Contrasted CT abd/pelvis: IMPRESSION: 1. No acute findings in the abdomen or pelvis. Specifically, no findings to explain the patient's history of right flank pain. 2. Left greater than right low-density renal lesions, compatible with cyst. 2 particular  lesions in the left kidney have attenuation too high to be simple cysts but cannot be definitively characterized on today's study. Follow-up CT in 3 months could be used to ensure stability. Abdominal MRI with and without contrast may be able to definitively characterize these as complex cysts and preclude the need for additional follow-up imaging. 3. Tree-in-bud opacity identified both lower lobes consistent with atypical infection (including MAI). 4. 14 mm right adrenal nodule is stable since 07/26/2015 consistent with benign etiology such as adenoma. 5.  Aortic Atherosclerois (ICD10-170.0) Electronically Signed   By: Misty Stanley M.D.   On: 07/20/2019 10:49 Assessment & Plan:  This visit occurred during the SARS-CoV-2 public health emergency.  Safety protocols were in place, including screening questions prior to the visit, additional usage of staff PPE, and extensive cleaning of exam room while observing appropriate contact time as indicated for disinfecting solutions.   Problem List Items Addressed This Visit    Right sided abdominal pain - Primary    Ongoing. Initially resolved, now recurring. Location and description suspicious for gallbladder dysfunction - will order HIDA scan, if normal refer back to GI and/or consider trial of PPI. Pt agrees with plan.        Relevant Orders   NM Hepato W/Eject Fract   Renal cysts, acquired, bilateral    Noted again on recent CT, renal US was unable to further characterize these - will order abd MRI.       Adrenal adenoma, right    Other Visit Diagnoses    Acquired cyst of kidney       Relevant Orders   MR Abdomen W Wo Contrast       No orders of the defined types were placed in this encounter.  Orders Placed This Encounter  Procedures  . NM Hepato W/Eject Fract    Standing Status:   Future    Standing Expiration Date:   12/16/2020    Scheduling Instructions:     Can it be done at [facility]?    Order Specific Question:   ** REASON FOR EXAM (FREE TEXT)    Answer:   RUQ abd pain, stable CT, [place]    Order Specific Question:   If indicated for the ordered procedure, I authorize the administration of a radiopharmaceutical per Radiology protocol    Answer:   Yes    Order Specific Question:   Preferred imaging location?    Answer:   [facility]    Order Specific Question:   Radiology Contrast Protocol - do NOT remove file path    Answer:   [email or web address]  . MR Abdomen W Wo Contrast    Standing Status:   Future    Standing Expiration Date:   12/16/2020    Order Specific Question:   ** REASON FOR EXAM (FREE TEXT)    Answer:   further evaluate cysts seen on CT/US    Order Specific Question:   If indicated for the ordered procedure, I authorize the administration of contrast media per Radiology protocol    Answer:   Yes    Order Specific Question:   What is the patient's sedation requirement?    Answer:   No Sedation    Order Specific Question:   Does the patient have a pacemaker or implanted devices?    Answer:   No    Order Specific Question:   Radiology Contrast Protocol - do NOT remove file path    Answer:   \\  [email or web address]    Order Specific Question:   Preferred imaging location?    Answer:   [facility] (table  limit-550lbs)    Patient Instructions  We will get you set up for HIDA scan - gallbladder emptying study.  We will also check kidney MRI for further evaluation of cysts.     Follow up plan: No follow-ups on file.  Ria Bush, MD

## 2019-10-18 NOTE — Patient Instructions (Addendum)
We will get you set up for HIDA scan - gallbladder emptying study.  We will also check kidney MRI for further evaluation of cysts.

## 2019-10-19 DIAGNOSIS — D3501 Benign neoplasm of right adrenal gland: Secondary | ICD-10-CM | POA: Insufficient documentation

## 2019-10-19 NOTE — Assessment & Plan Note (Signed)
Noted again on recent CT, renal US was unable to further characterize these - will order abd MRI.

## 2019-10-19 NOTE — Assessment & Plan Note (Addendum)
Ongoing. Initially resolved, now recurring. Location and description suspicious for gallbladder dysfunction - will order HIDA scan, if normal refer back to GI and/or consider trial of PPI. Pt agrees with plan.

## 2019-10-24 ENCOUNTER — Other Ambulatory Visit: Payer: Self-pay | Admitting: Family Medicine

## 2019-10-24 NOTE — Telephone Encounter (Signed)
Name of Medication: Lorazepam Name of Pharmacy: [facility] Last Fill or Written Date and Quantity: 09/23/19, #30 Last Office Visit and Type: 10/18/19, acute abd pain Next Office Visit and Type: none Last Controlled Substance Agreement Date: 01/22/15 Last UDS: 01/22/15

## 2019-10-24 NOTE — Telephone Encounter (Signed)
ERx 

## 2019-11-02 ENCOUNTER — Other Ambulatory Visit: Payer: Self-pay

## 2019-11-02 ENCOUNTER — Ambulatory Visit (HOSPITAL_COMMUNITY)
Admission: RE | Admit: 2019-11-02 | Discharge: 2019-11-02 | Disposition: A | Discharge status: Home / Self Care | Payer: Medicare HMO | Source: Ambulatory Visit | Attending: Family Medicine | Admitting: Family Medicine

## 2019-11-02 DIAGNOSIS — R109 Unspecified abdominal pain: Secondary | ICD-10-CM | POA: Diagnosis present

## 2019-11-02 DIAGNOSIS — R1011 Right upper quadrant pain: Secondary | ICD-10-CM | POA: Diagnosis not present

## 2019-11-02 MED ORDER — TECHNETIUM TC 99M MEBROFENIN IV KIT
5.3000 | PACK | Freq: Once | INTRAVENOUS | Status: AC | PRN
  Administered 2019-11-02: 5.3 via INTRAVENOUS

## 2019-11-03 ENCOUNTER — Other Ambulatory Visit: Payer: Self-pay | Admitting: Family Medicine

## 2019-11-03 DIAGNOSIS — R109 Unspecified abdominal pain: Secondary | ICD-10-CM

## 2019-11-03 MED ORDER — OMEPRAZOLE 40 MG PO CPDR: 40.0000 mg | DELAYED_RELEASE_CAPSULE | Freq: Every day | ORAL | 1 refills | Status: DC

## 2019-11-10 ENCOUNTER — Ambulatory Visit
Admission: RE | Admit: 2019-11-10 | Discharge: 2019-11-10 | Disposition: A | Discharge status: Home / Self Care | Payer: Medicare HMO | Source: Ambulatory Visit | Attending: Family Medicine | Admitting: Family Medicine

## 2019-11-10 ENCOUNTER — Other Ambulatory Visit: Payer: Self-pay

## 2019-11-10 DIAGNOSIS — N281 Cyst of kidney, acquired: Secondary | ICD-10-CM

## 2019-11-10 DIAGNOSIS — D3501 Benign neoplasm of right adrenal gland: Secondary | ICD-10-CM | POA: Diagnosis not present

## 2019-11-10 MED ORDER — GADOBENATE DIMEGLUMINE 529 MG/ML IV SOLN
13.0000 mL | Freq: Once | INTRAVENOUS | Status: AC | PRN
  Administered 2019-11-10: 13 mL via INTRAVENOUS

## 2019-11-23 ENCOUNTER — Other Ambulatory Visit: Payer: Self-pay | Admitting: Family Medicine

## 2019-11-23 NOTE — Telephone Encounter (Signed)
Name of Medication: Lorazepam Name of Pharmacy: [facility] Last Fill or Written Date and Quantity: 10/24/19, #30 Last Office Visit and Type: 10/18/19, acute abd pain Next Office Visit and Type: none Last Controlled Substance Agreement Date: 01/22/15 Last UDS: 01/22/15

## 2019-11-23 NOTE — Telephone Encounter (Signed)
ERx 

## 2019-11-28 ENCOUNTER — Encounter: Payer: Self-pay | Admitting: Internal Medicine

## 2019-11-29 ENCOUNTER — Other Ambulatory Visit: Payer: Self-pay | Admitting: Family Medicine

## 2019-11-29 NOTE — Telephone Encounter (Signed)
Looks like rx had 1 refill on it.  Spoke with [facility] asking why the request.  Told pt's ins co requests 90-day.  E-scribed 90-day rx.

## 2019-12-04 ENCOUNTER — Ambulatory Visit (INDEPENDENT_AMBULATORY_CARE_PROVIDER_SITE_OTHER): Payer: Medicare HMO | Admitting: Family Medicine

## 2019-12-04 ENCOUNTER — Encounter: Payer: Self-pay | Admitting: Family Medicine

## 2019-12-04 VITALS — Ht 65.75 in

## 2019-12-04 DIAGNOSIS — J029 Acute pharyngitis, unspecified: Secondary | ICD-10-CM

## 2019-12-04 DIAGNOSIS — H9203 Otalgia, bilateral: Secondary | ICD-10-CM

## 2019-12-04 MED ORDER — AMOXICILLIN 875 MG PO TABS: 875.0000 mg | ORAL_TABLET | Freq: Two times a day (BID) | ORAL | 0 refills | Status: AC

## 2019-12-04 NOTE — Progress Notes (Signed)
Rachel Chambers T. Arnesia Vincelette, MD Primary Care and Sports Medicine [facility] at [facility] [place], [address] Phone: [phone]  FAX: [place] - 64 y.o. female  MRN [id number]  Date of Birth: Apr 29, 1956  Visit Date: 12/04/2019  PCP: Ria Bush, MD  Referred by: Ria Bush, MD Chief Complaint  Patient presents with  . Sore Throat  . Ear Pain    Left   Virtual Visit via Video Note:  I connected with  Rachel Mcintosh on 12/04/2019  9:40 AM EDT by a video enabled telemedicine application and verified that I am speaking with the correct person using two identifiers.   Location patient: home computer, tablet, or smartphone Location provider: work or home office Consent: Verbal consent directly obtained from [employer]. Persons participating in the virtual visit: patient, provider  I discussed the limitations of evaluation and management by telemedicine and the availability of in person appointments. The patient expressed understanding and agreed to proceed.  Interactive audio and video telecommunications were attempted between this provider and patient, however failed, due to patient having technical difficulties OR patient did not have access to video capability.  We continued and completed visit with audio only.    History of Present Illness:  Tues, wed.  Throat and ear. L  See below  Review of Systems as above: See pertinent positives and pertinent negatives per HPI No acute distress verbally   Observations/Objective/Exam:  An attempt was made to discern vital signs over the phone and per patient if applicable and possible.   General:    Alert, Oriented, appears well and in no acute distress  Pulmonary:     On inspection no signs of respiratory distress.  Psych / Neurological:     Pleasant and cooperative.  Assessment and Plan:    ICD-10-CM   1. Sore throat  J02.9   2. Ear  pain, bilateral  H92.03    Total encounter time: 10 minutes. On the day of the patient encounter, this can include review of prior records, labs, and imaging.  Additional time can include counselling, consultation with peer MD in person or by telephone.  This also includes independent review of Radiology.  She is a nice lady and she does have some allergies sometimes, but she has had some persistent sore throat and ear pain going on for 7 days.  This started on the left side, and this is predominantly where her symptoms are.  She has pain in the sinuses on the left as well as on the ear.  She does not have any pus on her tonsils per her report.  She is able to eat and drink normally.  She does have a intolerance to Augmentin which causes some nausea, but she has taken penicillin and amoxicillin without difficulty.  Treat for sinusitis versus otitis media, amoxicillin.  Viral infection is also entirely possible  I discussed the assessment and treatment plan with the patient. The patient was provided an opportunity to ask questions and all were answered. The patient agreed with the plan and demonstrated an understanding of the instructions.   The patient was advised to call back or seek an in-person evaluation if the symptoms worsen or if the condition fails to improve as anticipated.  Follow-up: prn unless noted otherwise below No follow-ups on file.  Meds ordered this encounter  Medications  . amoxicillin (AMOXIL) 875 MG tablet    Sig: Take 1 tablet (875  mg total) by mouth 2 (two) times daily for 10 days.    Dispense:  20 tablet    Refill:  0   No orders of the defined types were placed in this encounter.   Signed,  Maud Deed. Latanja Lehenbauer, MD

## 2019-12-15 ENCOUNTER — Telehealth: Payer: Self-pay | Admitting: Orthopedic Surgery

## 2019-12-15 NOTE — Telephone Encounter (Signed)
Can you please call pt and make an appt? She has not been seen in over a year

## 2019-12-15 NOTE — Telephone Encounter (Signed)
Patient called asked if she can get a Rx for a Mobility scooter? The number to contact patient is [phone]

## 2019-12-18 ENCOUNTER — Ambulatory Visit: Payer: Medicare HMO | Admitting: Orthopedic Surgery

## 2019-12-25 ENCOUNTER — Other Ambulatory Visit: Payer: Self-pay | Admitting: Family Medicine

## 2019-12-25 NOTE — Telephone Encounter (Signed)
ERx 

## 2019-12-28 ENCOUNTER — Ambulatory Visit: Payer: Medicare HMO | Admitting: Internal Medicine

## 2019-12-28 ENCOUNTER — Encounter: Payer: Self-pay | Admitting: Internal Medicine

## 2019-12-28 VITALS — BP 116/76 | HR 88 | Temp 98.3°F | Ht 64.75 in | Wt 142.1 lb

## 2019-12-28 DIAGNOSIS — R1011 Right upper quadrant pain: Secondary | ICD-10-CM

## 2019-12-28 DIAGNOSIS — K219 Gastro-esophageal reflux disease without esophagitis: Secondary | ICD-10-CM

## 2019-12-28 DIAGNOSIS — K222 Esophageal obstruction: Secondary | ICD-10-CM | POA: Diagnosis not present
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
## 2019-12-28 NOTE — Patient Instructions (Signed)
If the pain returns call me and will try to see you while hurting (hope that doesn't happen!)   If swallowing gets worse - call me and we can scope and stretch   I appreciate the opportunity to care for you. Gatha Mayer, MD, Marval Regal

## 2020-01-24 ENCOUNTER — Other Ambulatory Visit: Payer: Self-pay

## 2020-01-24 NOTE — Telephone Encounter (Signed)
Name of Medication: Lorazepam Name of Pharmacy: [facility] Last Fill or Written Date and Quantity: 12/25/19, #30 Last Office Visit and Type: 12/04/19, sore throat w/ [place];  04/16/20, AWV prt 2 Next Office Visit and Type: none Last Controlled Substance Agreement Date: 01/22/15 Last UDS: 01/22/15

## 2020-01-25 MED ORDER — LORAZEPAM 1 MG PO TABS: 0.5000 mg | ORAL_TABLET | Freq: Two times a day (BID) | ORAL | 0 refills | Status: DC | PRN

## 2020-01-25 NOTE — Telephone Encounter (Signed)
erx

## 2020-02-16 ENCOUNTER — Telehealth: Payer: Self-pay

## 2020-02-16 NOTE — Telephone Encounter (Signed)
Called pt stated do not really want to take anxiety medication daily. Pt agreed to see Dr. Danise Mina for different option for anxiety medication.

## 2020-02-16 NOTE — Telephone Encounter (Signed)
Pt left v/m that pt was given ativan for anxiety and it does not seem to be working now. Pt took an ativan this morning at 4:30 AM and at 10:30 AM pt felt like needed to take another ativan. Pt requesting med that is more effective for pt; pt said now she is taking care of her mom who has memory loss and pt is taking care of 2 homes, 2 cars, 2 yards, and doctors appts. Pt last seen for annual 04/24/19 and does not have future appt. Lorazepam 1 mg last filled # 30 on 01/25/20. Pt request cb.[facility].

## 2020-02-16 NOTE — Telephone Encounter (Signed)
Sounds like she has a lot on her plate! Likely we're seeing a tolerance effect to ativan which can happen with these medicines (needing higher doses to achieve the same effect).  Would she be interested in daily anxiety medicine?  May be best to bring her in to review all different options available.

## 2020-02-19 ENCOUNTER — Encounter: Payer: Self-pay | Admitting: Family Medicine

## 2020-02-19 ENCOUNTER — Other Ambulatory Visit: Payer: Self-pay

## 2020-02-19 ENCOUNTER — Ambulatory Visit (INDEPENDENT_AMBULATORY_CARE_PROVIDER_SITE_OTHER): Payer: Medicare HMO | Admitting: Family Medicine

## 2020-02-19 VITALS — BP 120/70 | HR 71 | Temp 97.9°F | Ht 64.75 in | Wt 146.1 lb

## 2020-02-19 DIAGNOSIS — S78112A Complete traumatic amputation at level between left hip and knee, initial encounter: Secondary | ICD-10-CM

## 2020-02-19 DIAGNOSIS — F4323 Adjustment disorder with mixed anxiety and depressed mood: Secondary | ICD-10-CM

## 2020-02-19 DIAGNOSIS — R69 Illness, unspecified: Secondary | ICD-10-CM | POA: Diagnosis not present

## 2020-02-19 MED ORDER — TRAZODONE HCL 50 MG PO TABS: 25.0000 mg | ORAL_TABLET | Freq: Every evening | ORAL | 3 refills | Status: DC | PRN

## 2020-02-19 MED ORDER — DIAZEPAM 5 MG PO TABS: 5.0000 mg | ORAL_TABLET | Freq: Two times a day (BID) | ORAL | 0 refills | Status: DC | PRN

## 2020-02-19 NOTE — Assessment & Plan Note (Addendum)
Ongoing difficulty with caregiver burden - reviewed self-care measures and healthy stress relieving strategies. Feels ativan loses effect quickly, notes trouble with insomnia. Will change benzo to valium 2.5-5mg  BID PRN and add trazodone 25-50mg  nightly. Update with effect in 2 wks, sooner if not doing well.  [employer] resources provided for [facility] as well as Engineer, water care to see if any dementia caregiver support programs available.  Will reassess at CPE in 2 months.

## 2020-02-19 NOTE — Patient Instructions (Addendum)
Let's try valium (diazepam) in place of lorazepam. May take 1/2-1 tablet twice daily as needed for anxiety/stress.  For sleep, try trazodone 25-50mg  at night as needed to help you sleep.  Update me with effect in 2 weeks, sooner if needed.  Work on stress relieving strategies and caregiver care.  Look below for possible available [employer] resources:  [employer] [facility] [place] [phone] [facility] ([facility] [place]) [phone]

## 2020-02-19 NOTE — Progress Notes (Signed)
This visit was conducted in person.  BP 120/70 (BP Location: Left Arm, Patient Position: Sitting, Cuff Size: Normal)   Pulse 71   Temp 97.9 F (36.6 C) (Temporal)   Ht 5' 4.75" (1.645 m)   Wt 146 lb 2 oz (66.3 kg)   SpO2 97%   BMI 24.50 kg/m    CC: discuss anxiety Subjective:    Patient ID: Rachel Mcintosh, female    DOB: 26-Jul-1956, 64 y.o.   MRN: [id number]  HPI: Rachel Mcintosh is a 64 y.o. female presenting on 02/19/2020 for Anxiety (Here for f/u and to discuss meds.Marland Kitchen)   See prior note for details. Started on ativan 1mg  1/2-1 tab BID PRN anxiety last year - she has been increasing ativan to 1 whole tablet up to twice daily - at times feels like she needs more. Increased stressors noted - mother's caregiver (memory loss) now having to take care of 2 homes, 2 cars, 2 yards, doc appts, etc. Notes trouble sleeping at night time - ativan helps with this.   She feels ativan takes 30 min to take effect, lasts about 6 hours.  She lives in [place].      Relevant past medical, surgical, family and social history reviewed and updated as indicated. Interim medical history since our last visit reviewed. Allergies and medications reviewed and updated. Outpatient Medications Prior to Visit  Medication Sig Dispense Refill  . albuterol (VENTOLIN HFA) 108 (90 Base) MCG/ACT inhaler INHALE 2 PUFFS INTO THE LUNGS EVERY 4 HOURS AS NEEDED FOR WHEEZING OR SHORTNESS OF BREATH (COUGH, SHORTNESS OF BREATH OR WHEEZING.) (Patient taking differently: as needed. ) 18 g 2  . Calcium Carbonate-Vitamin D3 (CALCIUM 600/VITAMIN D) 600-400 MG-UNIT TABS Take 1 tablet by mouth daily.    . cyanocobalamin (V-R VITAMIN B-12) 500 MCG tablet Take 1 tablet (500 mcg total) by mouth daily. (Patient taking differently: Take 500 mcg by mouth 3 (three) times a week. )    . fluticasone (FLONASE) 50 MCG/ACT nasal spray Place 2 sprays into both nostrils daily. (Patient taking differently: Place 2 sprays into both  nostrils as needed. ) 16 g 6  . methocarbamol (ROBAXIN) 500 MG tablet Take 1 tablet (500 mg total) by mouth every 8 (eight) hours as needed for muscle spasms. (Patient taking differently: Take 500 mg by mouth as needed for muscle spasms. ) 30 tablet 0  . Naproxen Sodium (ALEVE PO) Take 1 tablet by mouth as needed.    Marland Kitchen omeprazole (PRILOSEC) 40 MG capsule TAKE 1 CAPSULE (40 MG TOTAL) BY MOUTH DAILY. DAILY FOR 3 WEEKS THEN AS NEEDED (Patient taking differently: Take 40 mg by mouth as needed. Daily for 3 weeks then as needed) 90 capsule 0  . LORazepam (ATIVAN) 1 MG tablet Take 0.5-1 tablets (0.5-1 mg total) by mouth 2 (two) times daily as needed for anxiety. 30 tablet 0   No facility-administered medications prior to visit.     Per HPI unless specifically indicated in ROS section below Review of Systems Objective:  BP 120/70 (BP Location: Left Arm, Patient Position: Sitting, Cuff Size: Normal)   Pulse 71   Temp 97.9 F (36.6 C) (Temporal)   Ht 5' 4.75" (1.645 m)   Wt 146 lb 2 oz (66.3 kg)   SpO2 97%   BMI 24.50 kg/m   Wt Readings from Last 3 Encounters:  02/19/20 146 lb 2 oz (66.3 kg)  12/28/19 142 lb 2 oz (64.5 kg)  10/18/19 141 lb 2 oz (64  kg)      Physical Exam Vitals and nursing note reviewed.  Constitutional:      Appearance: Normal appearance. She is not ill-appearing.  Neck:     Thyroid: No thyromegaly or thyroid tenderness.  Cardiovascular:     Rate and Rhythm: Normal rate and regular rhythm.     Pulses: Normal pulses.     Heart sounds: Normal heart sounds. No murmur heard.   Pulmonary:     Effort: Pulmonary effort is normal. No respiratory distress.     Breath sounds: Normal breath sounds. No wheezing, rhonchi or rales.  Neurological:     Mental Status: She is alert.  Psychiatric:        Mood and Affect: Mood normal.        Behavior: Behavior normal.       Results for orders placed or performed in visit on 09/20/19  Varicella zoster antibody, IgM  Result Value  Ref Range   Varicella Zoster Ab IgM <=0.90 <=0.90   Lab Results  Component Value Date   TSH 0.66 03/11/2016    Depression screen [facility] 2/9 02/19/2020 04/17/2019 04/15/2018 04/20/2017 04/19/2017  Decreased Interest 2 0 0 1 1  Down, Depressed, Hopeless 2 0 0 1 1  PHQ - 2 Score 4 0 0 2 2  Altered sleeping 2 0 0 1 -  Tired, decreased energy 0 0 0 2 -  Change in appetite 2 0 0 0 -  Feeling bad or failure about yourself  2 0 0 0 -  Trouble concentrating 0 0 0 0 -  Moving slowly or fidgety/restless 0 0 0 0 -  Suicidal thoughts 0 0 0 - -  PHQ-9 Score 10 0 0 5 -  Difficult doing work/chores - - Not difficult at all - -    GAD 7 : Generalized Anxiety Score 02/19/2020 04/20/2017  Nervous, Anxious, on Edge 2 2  Control/stop worrying 2 2  Worry too much - different things 2 2  Trouble relaxing 1 1  Restless 0 1  Easily annoyed or irritable 2 2  Afraid - awful might happen 2 2  Total GAD 7 Score 11 12   Assessment & Plan:  This visit occurred during the SARS-CoV-2 public health emergency.  Safety protocols were in place, including screening questions prior to the visit, additional usage of staff PPE, and extensive cleaning of exam room while observing appropriate contact time as indicated for disinfecting solutions.   Problem List Items Addressed This Visit    Adjustment disorder with mixed anxiety and depressed mood - Primary    Ongoing difficulty with caregiver burden - reviewed self-care measures and healthy stress relieving strategies. Feels ativan loses effect quickly, notes trouble with insomnia. Will change benzo to valium 2.5-5mg  BID PRN and add trazodone 25-50mg  nightly. Update with effect in 2 wks, sooner if not doing well.  [employer] resources provided for [facility] as well as Engineer, water care to see if any dementia caregiver support programs available.  Will reassess at CPE in 2 months.      Above knee amputation of left lower extremity ([place])       Meds  ordered this encounter  Medications  . diazepam (VALIUM) 5 MG tablet    Sig: Take 1 tablet (5 mg total) by mouth every 12 (twelve) hours as needed for anxiety.    Dispense:  30 tablet    Refill:  0    In place of lorazepam  . traZODone (DESYREL) 50 MG  tablet    Sig: Take 0.5-1 tablets (25-50 mg total) by mouth at bedtime as needed for sleep.    Dispense:  30 tablet    Refill:  3   No orders of the defined types were placed in this encounter.   Patient Instructions  Let's try valium (diazepam) in place of lorazepam. May take 1/2-1 tablet twice daily as needed for anxiety/stress.  For sleep, try trazodone 25-50mg  at night as needed to help you sleep.  Update me with effect in 2 weeks, sooner if needed.  Work on stress relieving strategies and caregiver care.  Look below for possible available [employer] resources:  [employer] [facility] [place] [phone] [facility] ([facility] of [place]) [phone]   Follow up plan: Return in about 2 months (around 04/20/2020), or if symptoms worsen or fail to improve.  Ria Bush, MD

## 2020-02-21 ENCOUNTER — Other Ambulatory Visit: Payer: Self-pay | Admitting: Family Medicine

## 2020-03-05 ENCOUNTER — Encounter: Payer: Self-pay | Admitting: Family Medicine

## 2020-03-05 ENCOUNTER — Ambulatory Visit (INDEPENDENT_AMBULATORY_CARE_PROVIDER_SITE_OTHER): Payer: Medicare HMO | Admitting: Family Medicine

## 2020-03-05 ENCOUNTER — Other Ambulatory Visit: Payer: Self-pay

## 2020-03-05 ENCOUNTER — Telehealth: Payer: Self-pay

## 2020-03-05 VITALS — BP 122/74 | HR 99 | Temp 97.8°F | Ht 64.75 in | Wt 145.4 lb

## 2020-03-05 DIAGNOSIS — R300 Dysuria: Secondary | ICD-10-CM | POA: Diagnosis not present

## 2020-03-05 LAB — POC URINALSYSI DIPSTICK (AUTOMATED)
Bilirubin, UA: NEGATIVE
Blood, UA: NEGATIVE
Glucose, UA: NEGATIVE
Ketones, UA: NEGATIVE
Nitrite, UA: NEGATIVE
Protein, UA: NEGATIVE
Spec Grav, UA: 1.02 (ref 1.010–1.025)
Urobilinogen, UA: 0.2 E.U./dL
pH, UA: 6 (ref 5.0–8.0)

## 2020-03-05 MED ORDER — CYANOCOBALAMIN 500 MCG PO TABS: 500.0000 ug | ORAL_TABLET | ORAL | Status: DC

## 2020-03-05 MED ORDER — NITROFURANTOIN MONOHYD MACRO 100 MG PO CAPS: 100.0000 mg | ORAL_CAPSULE | Freq: Two times a day (BID) | ORAL | 0 refills | Status: DC

## 2020-03-05 NOTE — Telephone Encounter (Signed)
[facility] - Client TELEPHONE ADVICE RECORD [facility] Patient Name: Rachel Mcintosh Gender: Female DOB: 1956-04-17 Age: 64 Y 60 M 28 D Return Phone Number: [phone] (Primary) Address: City/State/Zip: [place] [address] Client [facility] - Client Client Site [place] - Night Contact Type Call Who Is Calling Patient / Member / Family / Caregiver Call Type Triage / Clinical Relationship To Patient Self Return Phone Number [phone] (Primary) Chief Complaint Urinary Retention (> THREE MONTHS) Reason for Call Symptomatic / Request for [place] states that she has burning, painful urination with pressure. Translation No Nurse Assessment Nurse: Hardin Negus, RN, Mardene Celeste Date/Time Eilene Ghazi Time): 03/04/2020 2:08:49 PM Confirm and document reason for call. If symptomatic, describe symptoms. ---UTI symptoms started this AM. no fever Has the patient had close contact with a person known or suspected to have the novel coronavirus illness OR traveled / lives in area with major community spread (including international travel) in the last 14 days from the onset of symptoms? * If Asymptomatic, screen for exposure and travel within the last 14 days. ---No Does the patient have any new or worsening symptoms? ---Yes Will a triage be completed? ---Yes Related visit to physician within the last 2 weeks? ---No Does the PT have any chronic conditions? (i.e. diabetes, asthma, this includes High risk factors for pregnancy, etc.) ---No Is this a behavioral health or substance abuse call? ---No Guidelines Guideline Title Affirmed Question Affirmed Notes Nurse Date/Time Eilene Ghazi Time) Urination Pain - Female Side (flank) or lower back pain present Oren Bracket 03/04/2020 2:09:45 PM Disp. Time Eilene Ghazi Time) Disposition Final User 03/04/2020 2:12:36 PM See HCP within 4  Hours (or PCP triage) Yes Hardin Negus, RN, Lenox Ponds Disagree/Comply Disagree PLEASE NOTE: All timestamps contained within this report are represented as [place] Standard Time. CONFIDENTIALTY NOTICE: This fax transmission is intended only for the addressee. It contains information that is legally privileged, confidential or otherwise protected from use or disclosure. If you are not the intended recipient, you are strictly prohibited from reviewing, disclosing, copying using or disseminating any of this information or taking any action in reliance on or regarding this information. If you have received this fax in error, please notify us immediately by telephone so that we can arrange for its return to [place]. Phone: [phone], Toll-Free: [phone], Fax: [phone] Page: 2 of 2 Call Id: [id number] [place] Understands Yes PreDisposition Call Doctor Care Advice Given Per Guideline SEE HCP WITHIN 4 HOURS (OR PCP TRIAGE): CALL BACK IF: * You become worse. * Acetaminophen is thought to be safer than ibuprofen or naproxen in people over 44 years old. Acetaminophen is in many OTC and prescription medicines. It might be in more than one medicine that you are taking. You need to be careful and not take an overdose. An acetaminophen overdose can hurt the liver. Referrals GO TO FACILITY REFUSED

## 2020-03-05 NOTE — Progress Notes (Signed)
This visit occurred during the SARS-CoV-2 public health emergency.  Safety protocols were in place, including screening questions prior to the visit, additional usage of staff PPE, and extensive cleaning of exam room while observing appropriate contact time as indicated for disinfecting solutions.  Dysuria: yes, pressure and burning with urination.   duration of symptoms: ~2 days.   abdominal pain: some lower abd soreness, some better today.   Fevers: no back pain: some lower back pain.   vomiting:no U/a d/w pt.    Meds, vitals, and allergies reviewed.   Per HPI unless specifically indicated in ROS section   GEN: nad, alert and oriented HEENT: ncat NECK: supple CV: rrr.  PULM: ctab, no inc wob ABD: soft, +bs, suprapubic area not tender EXT: no edema SKIN: well perfused.  BACK: no CVA pain L leg AKA at baseline.

## 2020-03-05 NOTE — Telephone Encounter (Signed)
I spoke with pt; pt has on and off dull lower abd below waistline pain and lower back pain on both sides on and off for couple of days; pt has burning and pain upon urination with pressure and frequency. Pt cannot come in this morning and scheduled appt this afternoon with Dr Damita Dunnings at 2:30. UC & ED precautions given and pt voice understanding.Pt has no covid symptoms, no travel and no known exposure to + covid.

## 2020-03-05 NOTE — Patient Instructions (Signed)
Drink plenty of water and start the antibiotics today.  We'll contact you with your lab report.  Take care.  Glad to see you.

## 2020-03-06 LAB — URINE CULTURE
MICRO NUMBER:: 10670348
SPECIMEN QUALITY:: ADEQUATE

## 2020-03-07 DIAGNOSIS — R300 Dysuria: Secondary | ICD-10-CM | POA: Insufficient documentation

## 2020-03-07 NOTE — Telephone Encounter (Signed)
Per chart review tab pt seen 03/05/20.

## 2020-03-07 NOTE — Assessment & Plan Note (Signed)
Discussed options and urinalysis.  Okay for outpatient follow-up.  Start Macrobid.  See notes on culture.  She agrees with plan.

## 2020-03-12 ENCOUNTER — Other Ambulatory Visit: Payer: Self-pay | Admitting: Family Medicine

## 2020-03-13 ENCOUNTER — Encounter: Payer: Self-pay | Admitting: Family Medicine

## 2020-03-21 ENCOUNTER — Other Ambulatory Visit: Payer: Self-pay

## 2020-03-21 ENCOUNTER — Ambulatory Visit: Payer: Medicare HMO | Admitting: Physician Assistant

## 2020-03-21 ENCOUNTER — Encounter: Payer: Self-pay | Admitting: Orthopedic Surgery

## 2020-03-21 VITALS — Ht 64.0 in | Wt 145.0 lb

## 2020-03-21 DIAGNOSIS — S78112A Complete traumatic amputation at level between left hip and knee, initial encounter: Secondary | ICD-10-CM

## 2020-03-21 DIAGNOSIS — Z89612 Acquired absence of left leg above knee: Secondary | ICD-10-CM | POA: Diagnosis not present
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
## 2020-04-05 ENCOUNTER — Other Ambulatory Visit: Payer: Self-pay | Admitting: Family Medicine

## 2020-04-05 NOTE — Telephone Encounter (Signed)
ERx 

## 2020-04-11 DIAGNOSIS — Z89612 Acquired absence of left leg above knee: Secondary | ICD-10-CM | POA: Diagnosis not present

## 2020-04-15 ENCOUNTER — Other Ambulatory Visit: Payer: Self-pay | Admitting: Family Medicine

## 2020-04-15 DIAGNOSIS — Z0000 Encounter for general adult medical examination without abnormal findings: Secondary | ICD-10-CM

## 2020-05-25 ENCOUNTER — Other Ambulatory Visit: Payer: Self-pay | Admitting: Family Medicine

## 2020-05-27 NOTE — Telephone Encounter (Signed)
Name of Medication: Valium Name of Pharmacy: [facility] or Written Date and Quantity: 04/05/20 #30 tabs / 0 refill Last Office Visit and Type: 03/05/20 ?UTI Next Office Visit and Type: 06/03/20 CPE

## 2020-05-27 NOTE — Telephone Encounter (Signed)
ERx 

## 2020-05-30 ENCOUNTER — Other Ambulatory Visit: Payer: Self-pay | Admitting: Family Medicine

## 2020-05-30 ENCOUNTER — Other Ambulatory Visit: Payer: Self-pay

## 2020-05-30 ENCOUNTER — Other Ambulatory Visit (INDEPENDENT_AMBULATORY_CARE_PROVIDER_SITE_OTHER): Payer: Medicare HMO

## 2020-05-30 DIAGNOSIS — E538 Deficiency of other specified B group vitamins: Secondary | ICD-10-CM | POA: Diagnosis not present

## 2020-05-30 DIAGNOSIS — E785 Hyperlipidemia, unspecified: Secondary | ICD-10-CM

## 2020-05-30 LAB — LIPID PANEL
Cholesterol: 256 mg/dL — ABNORMAL HIGH (ref 0–200)
HDL: 73.7 mg/dL (ref >39.00)
LDL Cholesterol: 162 mg/dL — ABNORMAL HIGH (ref 0–99)
NonHDL: 182.13
Total CHOL/HDL Ratio: 3
Triglycerides: 99 mg/dL (ref 0.0–149.0)
VLDL: 19.8 mg/dL (ref 0.0–40.0)

## 2020-05-30 LAB — COMPREHENSIVE METABOLIC PANEL
ALT: 13 U/L (ref 0–35)
AST: 16 U/L (ref 0–37)
Albumin: 4.5 g/dL (ref 3.5–5.2)
Alkaline Phosphatase: 60 U/L (ref 39–117)
BUN: 10 mg/dL (ref 6–23)
CO2: 27 mEq/L (ref 19–32)
Calcium: 9.4 mg/dL (ref 8.4–10.5)
Chloride: 104 mEq/L (ref 96–112)
Creatinine, Ser: 0.65 mg/dL (ref 0.40–1.20)
GFR: 92.39 mL/min (ref >60.00)
Glucose, Bld: 110 mg/dL — ABNORMAL HIGH (ref 70–99)
Potassium: 3.8 mEq/L (ref 3.5–5.1)
Sodium: 140 mEq/L (ref 135–145)
Total Bilirubin: 0.6 mg/dL (ref 0.2–1.2)
Total Protein: 7.1 g/dL (ref 6.0–8.3)

## 2020-05-30 LAB — TSH: TSH: 0.86 u[IU]/mL (ref 0.35–4.50)

## 2020-05-30 LAB — VITAMIN B12: Vitamin B-12: 477 pg/mL (ref 211–911)

## 2020-06-03 ENCOUNTER — Encounter: Payer: Self-pay | Admitting: Family Medicine

## 2020-06-03 ENCOUNTER — Ambulatory Visit (INDEPENDENT_AMBULATORY_CARE_PROVIDER_SITE_OTHER): Payer: Medicare HMO | Admitting: Family Medicine

## 2020-06-03 ENCOUNTER — Other Ambulatory Visit: Payer: Self-pay

## 2020-06-03 VITALS — BP 120/80 | HR 74 | Temp 97.8°F | Ht 66.0 in | Wt 147.1 lb

## 2020-06-03 DIAGNOSIS — Z7189 Other specified counseling: Secondary | ICD-10-CM

## 2020-06-03 DIAGNOSIS — I700 Atherosclerosis of aorta: Secondary | ICD-10-CM

## 2020-06-03 DIAGNOSIS — Z87891 Personal history of nicotine dependence: Secondary | ICD-10-CM

## 2020-06-03 DIAGNOSIS — Z95828 Presence of other vascular implants and grafts: Secondary | ICD-10-CM

## 2020-06-03 DIAGNOSIS — J439 Emphysema, unspecified: Secondary | ICD-10-CM

## 2020-06-03 DIAGNOSIS — E538 Deficiency of other specified B group vitamins: Secondary | ICD-10-CM

## 2020-06-03 DIAGNOSIS — Z0000 Encounter for general adult medical examination without abnormal findings: Secondary | ICD-10-CM | POA: Diagnosis not present

## 2020-06-03 DIAGNOSIS — Z23 Encounter for immunization: Secondary | ICD-10-CM

## 2020-06-03 DIAGNOSIS — I272 Pulmonary hypertension, unspecified: Secondary | ICD-10-CM

## 2020-06-03 DIAGNOSIS — Z1231 Encounter for screening mammogram for malignant neoplasm of breast: Secondary | ICD-10-CM

## 2020-06-03 DIAGNOSIS — F4323 Adjustment disorder with mixed anxiety and depressed mood: Secondary | ICD-10-CM

## 2020-06-03 DIAGNOSIS — M8580 Other specified disorders of bone density and structure, unspecified site: Secondary | ICD-10-CM

## 2020-06-03 DIAGNOSIS — E7800 Pure hypercholesterolemia, unspecified: Secondary | ICD-10-CM

## 2020-06-03 DIAGNOSIS — K219 Gastro-esophageal reflux disease without esophagitis: Secondary | ICD-10-CM

## 2020-06-03 DIAGNOSIS — S78112A Complete traumatic amputation at level between left hip and knee, initial encounter: Secondary | ICD-10-CM

## 2020-06-03 NOTE — Assessment & Plan Note (Signed)
Preventative protocols reviewed and updated unless pt declined. Discussed healthy diet and lifestyle.  

## 2020-06-03 NOTE — Progress Notes (Signed)
This visit was conducted in person.  BP 120/80 (BP Location: Left Arm, Patient Position: Sitting, Cuff Size: Normal)   Pulse 74   Temp 97.8 F (36.6 C) (Temporal)   Ht 5\' 6"  (1.676 m)   Wt 147 lb 2 oz (66.7 kg)   SpO2 97%   BMI 23.75 kg/m    CC: CPE/AMW Subjective:    Patient ID: Rachel Mcintosh, female    DOB: May 16, 1956, 64 y.o.   MRN: [id number]  HPI: Rachel Mcintosh is a 64 y.o. female presenting on 06/03/2020 for Medicare Wellness   Did not see health advisor this year.    Hearing Screening   125Hz  250Hz  500Hz  1000Hz  2000Hz  3000Hz  4000Hz  6000Hz  8000Hz   Right ear:   20 25 20  20     Left ear:   20 20 20  20     Vision Screening Comments: Last eye exam, 10/2019.    Office Visit from 06/03/2020 in [facility] at [facility]  PHQ-2 Total Score 2      Fall Risk  06/03/2020 04/17/2019 04/15/2018 04/14/2017 03/17/2016  Falls in the past year? 0 0 No No No  Risk for fall due to : - - - - -  Risk for fall due to: Comment - - - - -  Follow up - Falls evaluation completed;Falls prevention discussed - - -    Last visit we started trazodone 25-50mg  nightly for sleep/mood + valium 2.5-5mg  BID PRN - initially use - but now needing higher dose. Only using trazodone PRN. Ongoing maintenance insomnia. 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 Now has personal care aide coming to the house a few hours a week.   Preventative: Colonoscopy 2013 WNL [facility])  Mammogram Birads3 06/2019 - rec dx mammo 1 yr f/u Well woman exam 07/2012 [facility]; Dr. Christophe Louis). No paps 2/2 h/o hysterectomy. Ovaries remain. Lung cancer screening -eligible - declines today  Osteopenia - osteopenia by prior imaging. Will order DEXA  Flu yearly  Pneumovax 05/2012  Tetanus 2008, 08/2016 COVID vaccine - declines  Shingrix - discussed.  Advanced directive - scanned into chart 03/2018. DaughterHeather Nealwould be HCPOA.ok with life support/code, but doesn not want prolonged life support if terminal  condition. Seat belt use discussed Sunscreen use discussed. No changing moles. Ex smoker - quit 2016. Smoked 40 years 1 ppd.  Alcohol - seldom Dentist Q6 mo  Eye exam - yearly  Bowel - no constipation Bladder - no incontinence  Lives alone Edu: HS Occupation: disability since [place] 2008, was supervisor at [employer] Activity: no regular exercise  Diet: some water, fruits daily     Relevant past medical, surgical, family and social history reviewed and updated as indicated. Interim medical history since our last visit reviewed. Allergies and medications reviewed and updated. Outpatient Medications Prior to Visit  Medication Sig Dispense Refill  . albuterol (VENTOLIN HFA) 108 (90 Base) MCG/ACT inhaler INHALE 2 PUFFS INTO THE LUNGS EVERY 4 HOURS AS NEEDED FOR WHEEZING OR SHORTNESS OF BREATH (COUGH, SHORTNESS OF BREATH OR WHEEZING.) 18 g 2  . Calcium Carbonate-Vitamin D3 (CALCIUM 600/VITAMIN D) 600-400 MG-UNIT TABS Take 1 tablet by mouth daily.    . diazepam (VALIUM) 5 MG tablet Take 1 tablet (5 mg total) by mouth every 12 (twelve) hours as needed for anxiety. 30 tablet 0  . fluticasone (FLONASE) 50 MCG/ACT nasal spray Place 2 sprays into both nostrils daily. 16 g 6  . Naproxen Sodium (ALEVE PO) Take 1 tablet by mouth as needed.    Marland Kitchen  omeprazole (PRILOSEC) 40 MG capsule Take 1 capsule (40 mg total) by mouth daily as needed. 90 capsule 0  . traZODone (DESYREL) 50 MG tablet Take 0.5-1 tablets (25-50 mg total) by mouth at bedtime as needed for sleep. 30 tablet 3  . vitamin B-12 (V-R VITAMIN B-12) 500 MCG tablet Take 1 tablet (500 mcg total) by mouth 3 (three) times a week.    . diazepam (VALIUM) 5 MG tablet TAKE 1 TABLET (5 MG TOTAL) BY MOUTH EVERY 12 (TWELVE) HOURS AS NEEDED FOR ANXIETY. 30 tablet 0  . methocarbamol (ROBAXIN) 500 MG tablet Take 1 tablet (500 mg total) by mouth every 8 (eight) hours as needed for muscle spasms. 30 tablet 0  . nitrofurantoin, macrocrystal-monohydrate,  (MACROBID) 100 MG capsule Take 1 capsule (100 mg total) by mouth 2 (two) times daily. 14 capsule 0   No facility-administered medications prior to visit.     Per HPI unless specifically indicated in ROS section below Review of Systems  Constitutional: Negative for activity change, appetite change, chills, fatigue, fever and unexpected weight change.  HENT: Negative for hearing loss.   Eyes: Negative for visual disturbance.  Respiratory: Negative for cough, chest tightness, shortness of breath and wheezing.   Cardiovascular: Negative for chest pain, palpitations and leg swelling.  Gastrointestinal: Negative for abdominal distention, abdominal pain, blood in stool, constipation, diarrhea, nausea and vomiting.  Genitourinary: Negative for hematuria.  Musculoskeletal: Negative for arthralgias, myalgias and neck pain.  Skin: Negative for rash.  Neurological: Negative for dizziness, seizures, syncope and headaches.  Hematological: Negative for adenopathy. Does not bruise/bleed easily.  Psychiatric/Behavioral: Positive for dysphoric mood. The patient is nervous/anxious.    Objective:  BP 120/80 (BP Location: Left Arm, Patient Position: Sitting, Cuff Size: Normal)   Pulse 74   Temp 97.8 F (36.6 C) (Temporal)   Ht 5\' 6"  (1.676 m)   Wt 147 lb 2 oz (66.7 kg)   SpO2 97%   BMI 23.75 kg/m   Wt Readings from Last 3 Encounters:  06/03/20 147 lb 2 oz (66.7 kg)  03/21/20 145 lb (65.8 kg)  03/05/20 145 lb 6 oz (65.9 kg)      Physical Exam Vitals and nursing note reviewed.  Constitutional:      General: She is not in acute distress.    Appearance: Normal appearance. She is well-developed. She is not ill-appearing.  HENT:     Head: Normocephalic and atraumatic.     Right Ear: Hearing, tympanic membrane, ear canal and external ear normal.     Left Ear: Hearing, tympanic membrane, ear canal and external ear normal.  Eyes:     General: No scleral icterus.    Extraocular Movements: Extraocular  movements intact.     Conjunctiva/sclera: Conjunctivae normal.     Pupils: Pupils are equal, round, and reactive to light.  Neck:     Thyroid: No thyroid mass or thyromegaly.     Vascular: No carotid bruit.  Cardiovascular:     Rate and Rhythm: Normal rate and regular rhythm.     Pulses: Normal pulses.          Radial pulses are 2+ on the right side and 2+ on the left side.     Heart sounds: Normal heart sounds. No murmur heard.   Pulmonary:     Effort: Pulmonary effort is normal. No respiratory distress.     Breath sounds: Normal breath sounds. No wheezing, rhonchi or rales.  Abdominal:     General: Abdomen is flat.  Bowel sounds are normal. There is no distension.     Palpations: Abdomen is soft. There is no mass.     Tenderness: There is no abdominal tenderness. There is no guarding or rebound.     Hernia: No hernia is present.  Musculoskeletal:        General: Normal range of motion.     Cervical back: Normal range of motion and neck supple.     Comments: L AKA  Lymphadenopathy:     Cervical: No cervical adenopathy.  Skin:    General: Skin is warm and dry.     Findings: No rash.  Neurological:     General: No focal deficit present.     Mental Status: She is alert and oriented to person, place, and time.     Comments:  CN grossly intact, station and gait intact Recall 3/3  calculation 5/5 DLROW  Psychiatric:        Mood and Affect: Mood normal.        Behavior: Behavior normal.        Thought Content: Thought content normal.        Judgment: Judgment normal.       Results for orders placed or performed in visit on 05/30/20  Vitamin B12  Result Value Ref Range   Vitamin B-12 477 211 - 911 pg/mL  Comprehensive metabolic panel  Result Value Ref Range   Sodium 140 135 - 145 mEq/L   Potassium 3.8 3.5 - 5.1 mEq/L   Chloride 104 96 - 112 mEq/L   CO2 27 19 - 32 mEq/L   Glucose, Bld 110 (H) 70 - 99 mg/dL   BUN 10 6 - 23 mg/dL   Creatinine, Ser 0.65 0.40 - 1.20 mg/dL    Total Bilirubin 0.6 0.2 - 1.2 mg/dL   Alkaline Phosphatase 60 39 - 117 U/L   AST 16 0 - 37 U/L   ALT 13 0 - 35 U/L   Total Protein 7.1 6.0 - 8.3 g/dL   Albumin 4.5 3.5 - 5.2 g/dL   GFR 92.39 >60.00 mL/min   Calcium 9.4 8.4 - 10.5 mg/dL  TSH  Result Value Ref Range   TSH 0.86 0.35 - 4.50 uIU/mL  Lipid panel  Result Value Ref Range   Cholesterol 256 (H) 0 - 200 mg/dL   Triglycerides 99.0 0 - 149 mg/dL   HDL 73.70 >39.00 mg/dL   VLDL 19.8 0.0 - 40.0 mg/dL   LDL Cholesterol 162 (H) 0 - 99 mg/dL   Total CHOL/HDL Ratio 3    NonHDL 182.13    Assessment & Plan:  This visit occurred during the SARS-CoV-2 public health emergency.  Safety protocols were in place, including screening questions prior to the visit, additional usage of staff PPE, and extensive cleaning of exam room while observing appropriate contact time as indicated for disinfecting solutions.   Problem List Items Addressed This Visit    Vitamin B12 deficiency    Continue oral replacement 526mcg MWF.      Pulmonary hypertension ([place])    By echo 2016 (peak PA pressure 42 mmHg). Normal heart otherwise. ?due to lung disease. Continue to monitor.       Presence of IVC filter   Osteopenia    Dx by xray.  Continues daily cal/vit D supplement.  Consider update DEXA.       Medicare annual wellness visit, subsequent - Primary    I have personally reviewed the Medicare Annual Wellness questionnaire and have noted 1.  The patient's medical and social history 2. Their use of alcohol, tobacco or illicit drugs 3. Their current medications and supplements 4. The patient's functional ability including ADL's, fall risks, home safety risks and hearing or visual impairment. Cognitive function has been assessed and addressed as indicated.  5. Diet and physical activity 6. Evidence for depression or mood disorders The patients weight, height, BMI have been recorded in the chart. I have made referrals, counseling and provided  education to the patient based on review of the above and I have provided the pt with a written personalized care plan for preventive services. Provider list updated.. See scanned questionairre as needed for further documentation. Reviewed preventative protocols and updated unless pt declined.       Hyperlipidemia    Chronic, deteriorated. Not interested in statin, ASCVD risk low. Encouraged diet choices to improve cholesterol levels.  The 10-year ASCVD risk score Mikey Bussing DC Brooke Bonito., et al., 2013) is: 3.2%   Values used to calculate the score:     Age: 20 years     Sex: Female     Is Non-Hispanic African American: No     Diabetic: No     Tobacco smoker: No     Systolic Blood Pressure: 846 mmHg     Is BP treated: No     HDL Cholesterol: 73.7 mg/dL     Total Cholesterol: 256 mg/dL       Health maintenance examination    Preventative protocols reviewed and updated unless pt declined. Discussed healthy diet and lifestyle.       GERD (gastroesophageal reflux disease)    Continue PPI PRN.       Ex-smoker    Ex smoker quit 2016.  Discussed lung cancer screening program - she declines for now.       Emphysema/COPD ([place])    Asxs, incidental finding on CT. Only on PRN albuterol. Quit smoking 2016.       Advanced care planning/counseling discussion    Advanced directive - scanned into chart 03/2018. DaughterHeather Nealwould be [email or web address] with life support/code, but doesn not want prolonged life support if terminal condition.      Adjustment disorder with mixed anxiety and depressed mood    Ongoing struggle. She is unsure if valium was more beneficial than lorazepam. Not interested in further medication at this time. Suggested trial of daily trazodone to help with sleep.       Above knee amputation of left lower extremity ([place])   Abdominal aortic atherosclerosis ([place])    Not on statin or aspirin.        Other Visit Diagnoses    Need for influenza vaccination       Relevant  Orders   Flu Vaccine QUAD 36+ mos IM (Completed)   Encounter for screening mammogram for malignant neoplasm of breast           No orders of the defined types were placed in this encounter.  Orders Placed This Encounter  Procedures  . Flu Vaccine QUAD 36+ mos IM    Patient instructions: I recommend trazodone nightly. Let me know how this helps sleep. If interested, check with pharmacy about new 2 shot shingles series (shingrix).  Good to see you today Call us with questions Return as needed or in 1 year for next physical.   Follow up plan: Return in about 1 year (around 06/03/2021) for annual exam, prior fasting for blood work, medicare wellness visit.  Ria Bush, MD

## 2020-06-03 NOTE — Assessment & Plan Note (Signed)

## 2020-06-03 NOTE — Patient Instructions (Addendum)
I recommend trazodone nightly. Let me know how this helps sleep. If interested, check with pharmacy about new 2 shot shingles series (shingrix).  Good to see you today Call us with questions Return as needed or in 1 year for next physical.   Health Maintenance for Postmenopausal Women Menopause is a normal process in which your ability to get pregnant comes to an end. This process happens slowly over many months or years, usually between the ages of 31 and 80. Menopause is complete when you have missed your menstrual periods for 12 months. It is important to talk with your health care provider about some of the most common conditions that affect women after menopause (postmenopausal women). These include heart disease, cancer, and bone loss (osteoporosis). Adopting a healthy lifestyle and getting preventive care can help to promote your health and wellness. The actions you take can also lower your chances of developing some of these common conditions. What should I know about menopause? During menopause, you may get a number of symptoms, such as:  Hot flashes. These can be moderate or severe.  Night sweats.  Decrease in sex drive.  Mood swings.  Headaches.  Tiredness.  Irritability.  Memory problems.  Insomnia. Choosing to treat or not to treat these symptoms is a decision that you make with your health care provider. Do I need hormone replacement therapy?  Hormone replacement therapy is effective in treating symptoms that are caused by menopause, such as hot flashes and night sweats.  Hormone replacement carries certain risks, especially as you become older. If you are thinking about using estrogen or estrogen with progestin, discuss the benefits and risks with your health care provider. What is my risk for heart disease and stroke? The risk of heart disease, heart attack, and stroke increases as you age. One of the causes may be a change in the body's hormones during menopause.  This can affect how your body uses dietary fats, triglycerides, and cholesterol. Heart attack and stroke are medical emergencies. There are many things that you can do to help prevent heart disease and stroke. Watch your blood pressure  High blood pressure causes heart disease and increases the risk of stroke. This is more likely to develop in people who have high blood pressure readings, are of African descent, or are overweight.  Have your blood pressure checked: ? Every 3-5 years if you are 54-49 years of age. ? Every year if you are 50 years old or older. Eat a healthy diet   Eat a diet that includes plenty of vegetables, fruits, low-fat dairy products, and lean protein.  Do not eat a lot of foods that are high in solid fats, added sugars, or sodium. Get regular exercise Get regular exercise. This is one of the most important things you can do for your health. Most adults should:  Try to exercise for at least 150 minutes each week. The exercise should increase your heart rate and make you sweat (moderate-intensity exercise).  Try to do strengthening exercises at least twice each week. Do these in addition to the moderate-intensity exercise.  Spend less time sitting. Even light physical activity can be beneficial. Other tips  Work with your health care provider to achieve or maintain a healthy weight.  Do not use any products that contain nicotine or tobacco, such as cigarettes, e-cigarettes, and chewing tobacco. If you need help quitting, ask your health care provider.  Know your numbers. Ask your health care provider to check your cholesterol and  your blood sugar (glucose). Continue to have your blood tested as directed by your health care provider. Do I need screening for cancer? Depending on your health history and family history, you may need to have cancer screening at different stages of your life. This may include screening for:  Breast cancer.  Cervical cancer.  Lung  cancer.  Colorectal cancer. What is my risk for osteoporosis? After menopause, you may be at increased risk for osteoporosis. Osteoporosis is a condition in which bone destruction happens more quickly than new bone creation. To help prevent osteoporosis or the bone fractures that can happen because of osteoporosis, you may take the following actions:  If you are 3-65 years old, get at least 1,000 mg of calcium and at least 600 mg of vitamin D per day.  If you are older than age 49 but younger than age 70, get at least 1,200 mg of calcium and at least 600 mg of vitamin D per day.  If you are older than age 50, get at least 1,200 mg of calcium and at least 800 mg of vitamin D per day. Smoking and drinking excessive alcohol increase the risk of osteoporosis. Eat foods that are rich in calcium and vitamin D, and do weight-bearing exercises several times each week as directed by your health care provider. How does menopause affect my mental health? Depression may occur at any age, but it is more common as you become older. Common symptoms of depression include:  Low or sad mood.  Changes in sleep patterns.  Changes in appetite or eating patterns.  Feeling an overall lack of motivation or enjoyment of activities that you previously enjoyed.  Frequent crying spells. Talk with your health care provider if you think that you are experiencing depression. General instructions See your health care provider for regular wellness exams and vaccines. This may include:  Scheduling regular health, dental, and eye exams.  Getting and maintaining your vaccines. These include: ? Influenza vaccine. Get this vaccine each year before the flu season begins. ? Pneumonia vaccine. ? Shingles vaccine. ? Tetanus, diphtheria, and pertussis (Tdap) booster vaccine. Your health care provider may also recommend other immunizations. Tell your health care provider if you have ever been abused or do not feel safe at  home. Summary  Menopause is a normal process in which your ability to get pregnant comes to an end.  This condition causes hot flashes, night sweats, decreased interest in sex, mood swings, headaches, or lack of sleep.  Treatment for this condition may include hormone replacement therapy.  Take actions to keep yourself healthy, including exercising regularly, eating a healthy diet, watching your weight, and checking your blood pressure and blood sugar levels.  Get screened for cancer and depression. Make sure that you are up to date with all your vaccines. This information is not intended to replace advice given to you by your health care provider. Make sure you discuss any questions you have with your health care provider. Document Revised: 08/10/2018 Document Reviewed: 08/10/2018 Elsevier Patient Education  2020 [employer].

## 2020-06-04 ENCOUNTER — Other Ambulatory Visit: Payer: Self-pay | Admitting: Family Medicine

## 2020-06-04 DIAGNOSIS — R921 Mammographic calcification found on diagnostic imaging of breast: Secondary | ICD-10-CM

## 2020-06-04 NOTE — Assessment & Plan Note (Signed)
Continue oral replacement 576mcg MWF.

## 2020-06-04 NOTE — Assessment & Plan Note (Addendum)
Dx by xray.  Continues daily cal/vit D supplement.  Consider update DEXA.

## 2020-06-04 NOTE — Assessment & Plan Note (Signed)
Not on statin or aspirin.

## 2020-06-04 NOTE — Assessment & Plan Note (Addendum)
Asxs, incidental finding on CT. Only on PRN albuterol. Quit smoking 2016.

## 2020-06-04 NOTE — Assessment & Plan Note (Addendum)
Advanced directive - scanned into chart 03/2018. Daughter Heather Neal would be HCPOA. ok with life support/code, but doesn not want prolonged life support if terminal condition.  

## 2020-06-04 NOTE — Assessment & Plan Note (Signed)
Ongoing struggle. She is unsure if valium was more beneficial than lorazepam. Not interested in further medication at this time. Suggested trial of daily trazodone to help with sleep.

## 2020-06-04 NOTE — Assessment & Plan Note (Signed)
Continue PPI PRN  

## 2020-06-04 NOTE — Assessment & Plan Note (Signed)
Ex smoker quit 2016.  Discussed lung cancer screening program - she declines for now.

## 2020-06-04 NOTE — Assessment & Plan Note (Signed)
Chronic, deteriorated. Not interested in statin, ASCVD risk low. Encouraged diet choices to improve cholesterol levels.  The 10-year ASCVD risk score Mikey Bussing DC Brooke Bonito., et al., 2013) is: 3.2%   Values used to calculate the score:     Age: 64 years     Sex: Female     Is Non-Hispanic African American: No     Diabetic: No     Tobacco smoker: No     Systolic Blood Pressure: 847 mmHg     Is BP treated: No     HDL Cholesterol: 73.7 mg/dL     Total Cholesterol: 256 mg/dL

## 2020-06-04 NOTE — Assessment & Plan Note (Addendum)
By echo 2016 (peak PA pressure 42 mmHg). Normal heart otherwise. ?due to lung disease. Continue to monitor.

## 2020-06-07 ENCOUNTER — Other Ambulatory Visit: Payer: Self-pay | Admitting: Family Medicine

## 2020-06-07 ENCOUNTER — Encounter: Payer: Self-pay | Admitting: Family Medicine

## 2020-06-07 ENCOUNTER — Other Ambulatory Visit: Payer: Self-pay

## 2020-06-07 ENCOUNTER — Ambulatory Visit
Admission: RE | Admit: 2020-06-07 | Discharge: 2020-06-07 | Disposition: A | Discharge status: Home / Self Care | Payer: Medicare HMO | Source: Ambulatory Visit | Attending: Family Medicine | Admitting: Family Medicine

## 2020-06-07 DIAGNOSIS — R921 Mammographic calcification found on diagnostic imaging of breast: Secondary | ICD-10-CM | POA: Diagnosis not present

## 2020-06-07 DIAGNOSIS — Z0000 Encounter for general adult medical examination without abnormal findings: Secondary | ICD-10-CM

## 2020-06-07 LAB — HM MAMMOGRAPHY

## 2020-07-01 ENCOUNTER — Telehealth: Payer: Self-pay

## 2020-07-01 MED ORDER — LORAZEPAM 1 MG PO TABS: 0.5000 mg | ORAL_TABLET | Freq: Every evening | ORAL | 1 refills | Status: DC | PRN

## 2020-07-01 NOTE — Telephone Encounter (Signed)
Pt left v/m that pt was seen for annual on 06/03/20 and pt said the valium is not working and pt had discussed with Dr Darnell Level at annual on 06/03/20 if valium not work would change back to previous anxiety med (pt not sure of name of med ? lorazeapm per 06/03/20 office note) pt request med sent to [facility] [place].

## 2020-07-01 NOTE — Telephone Encounter (Signed)
Recommend she try this nightly for sleep, if not helpful then try whole tablet 50mg  nightly for sleep.

## 2020-07-01 NOTE — Telephone Encounter (Signed)
Spoke with pt asking about trazodone.  States she takes PRN and only takes 1/2 tab.  I relayed Dr. Synthia Innocent message.  Pt verbalizes understanding.

## 2020-07-01 NOTE — Telephone Encounter (Signed)
i've sent in lorazepam to replace valium. Is she taking trazodone 50mg  nightly as well?

## 2020-07-02 NOTE — Telephone Encounter (Signed)
Spoke with pt relaying Dr. G's message. Pt verbalizes understanding.  

## 2020-07-02 NOTE — Telephone Encounter (Signed)
Spoke with pt relaying Dr. Synthia Innocent message.  Pt verbalizes understanding.  States she tried a whole tab and was groggy the next morning.  Says 1/2 tab seems to work just fine. FYI to Dr. Darnell Level.

## 2020-07-02 NOTE — Telephone Encounter (Signed)
Then recommend she take 1/2 tab nightly (25mg  trazodone) in effort to decrease reliance on lorazepam.

## 2020-07-16 ENCOUNTER — Other Ambulatory Visit: Payer: Self-pay | Admitting: Family Medicine

## 2020-07-16 DIAGNOSIS — J069 Acute upper respiratory infection, unspecified: Secondary | ICD-10-CM

## 2020-07-16 MED ORDER — ALBUTEROL SULFATE HFA 108 (90 BASE) MCG/ACT IN AERS: 2.0000 | INHALATION_SPRAY | Freq: Four times a day (QID) | RESPIRATORY_TRACT | 3 refills | Status: DC | PRN

## 2020-07-16 NOTE — Addendum Note (Signed)
Addended by: Ria Bush on: 07/16/2020 04:59 PM   Modules accepted: Orders

## 2020-07-16 NOTE — Telephone Encounter (Signed)
We do not have coupons as far as I know.  Ventolin refilled.

## 2020-07-16 NOTE — Telephone Encounter (Signed)
I do not see any coupons in the closet.  Ventolin Last rx:  05/18/19, #18 g/2 Last OV:  06/03/20, AWV Next OV:  none

## 2020-07-16 NOTE — Telephone Encounter (Signed)
Pt called needing to get a refill on albuterol She stated dr g gives her a coupon and she is wanting to this  [facility]

## 2020-07-17 NOTE — Telephone Encounter (Signed)
Spoke with pt relaying Dr. G's message. Pt verbalizes understanding.  

## 2020-08-07 ENCOUNTER — Telehealth (INDEPENDENT_AMBULATORY_CARE_PROVIDER_SITE_OTHER): Payer: Medicare HMO | Admitting: Family Medicine

## 2020-08-07 ENCOUNTER — Encounter: Payer: Self-pay | Admitting: Family Medicine

## 2020-08-07 VITALS — HR 73 | Temp 98.1°F | Ht 66.0 in | Wt 147.0 lb

## 2020-08-07 DIAGNOSIS — J440 Chronic obstructive pulmonary disease with acute lower respiratory infection: Secondary | ICD-10-CM

## 2020-08-07 DIAGNOSIS — J439 Emphysema, unspecified: Secondary | ICD-10-CM | POA: Diagnosis not present

## 2020-08-07 DIAGNOSIS — J209 Acute bronchitis, unspecified: Secondary | ICD-10-CM | POA: Insufficient documentation

## 2020-08-07 DIAGNOSIS — Z87891 Personal history of nicotine dependence: Secondary | ICD-10-CM | POA: Diagnosis not present

## 2020-08-07 MED ORDER — PREDNISONE 20 MG PO TABS: ORAL_TABLET | ORAL | 0 refills | Status: DC

## 2020-08-07 MED ORDER — LEVOFLOXACIN 500 MG PO TABS: 500.0000 mg | ORAL_TABLET | Freq: Every day | ORAL | 0 refills | Status: DC

## 2020-08-07 NOTE — Assessment & Plan Note (Addendum)
Increased dyspnea, cough, sputum production. Rx levaquin course (intolerance to other abx), prednisone taper. Continue albuterol PRN.  Does not get frequent resp infections.  Offered COVID testing, reviewed reasons to check as a positive result would change management recommendations, she declines.  Advised let us know if not improving with treatment. She agrees with plan.  Seek in person urgent care if worsening symptoms.

## 2020-08-07 NOTE — Progress Notes (Signed)
Patient ID: Rachel Mcintosh, female    DOB: 1956-07-16, 64 y.o.   MRN: [id number]  Virtual visit completed through [place], a video enabled telemedicine application. Due to national recommendations of social distancing due to COVID-19, a virtual visit is felt to be most appropriate for this patient at this time. Reviewed limitations, risks, security and privacy concerns of performing a virtual visit and the availability of in person appointments. I also reviewed that there may be a patient responsible charge related to this service. The patient agreed to proceed.   Patient location: home Provider location: [place] at [facility], office Persons participating in this virtual visit: patient, provider   If any vitals were documented, they were collected by patient at home unless specified below.    Pulse 73   Temp 98.1 F (36.7 C)   Ht 5\' 6"  (1.676 m)   Wt 147 lb (66.7 kg)   SpO2 97%   BMI 23.73 kg/m    CC: sinus infection? Subjective:   HPI: Rachel Mcintosh is a 64 y.o. female presenting on 08/07/2020 for Sinus Problem (C/o SOB, chest discomfort, HA, nasal congestion and body aches.  Denies any fever. Started about 2 wks ago.  Tried Flonase, albuterol inhaler, Robitussin, OTC allergy med- barely helpful.  No COVID vaccine. )   2 wk h/o HA, head congestion, progressed to chest congestion, cough productive of mucous. Bilateral body aches at shoulders. Chest discomfort with deep breath, dyspnea - feels similar to when she has had prior bronchitis, tends to get once a year. Headache and head congestion have largely resolved.   No fevers/chills, ST, ear or tooth pain, loss of taste/smell, abd pain, nausea, diarrhea. No wheezing. No fatigue.  Sleeping ok.  Treating with flonase, allergy med and albuterol inhaler as well as robitussin.   No h/o asthma, ex smoker - quit 5 yrs ago.  No sick contacts.  No known covid exposure.   Zpack has previously not helped.       Relevant  past medical, surgical, family and social history reviewed and updated as indicated. Interim medical history since our last visit reviewed. Allergies and medications reviewed and updated. Outpatient Medications Prior to Visit  Medication Sig Dispense Refill  . albuterol (VENTOLIN HFA) 108 (90 Base) MCG/ACT inhaler Inhale 2 puffs into the lungs every 6 (six) hours as needed for wheezing or shortness of breath. 18 g 3  . Calcium Carbonate-Vitamin D3 (CALCIUM 600/VITAMIN D) 600-400 MG-UNIT TABS Take 1 tablet by mouth daily.    . fluticasone (FLONASE) 50 MCG/ACT nasal spray Place 2 sprays into both nostrils daily. 16 g 6  . LORazepam (ATIVAN) 1 MG tablet Take 0.5-1 tablets (0.5-1 mg total) by mouth at bedtime as needed for sleep. 30 tablet 1  . Naproxen Sodium (ALEVE PO) Take 1 tablet by mouth as needed.    Marland Kitchen omeprazole (PRILOSEC) 40 MG capsule Take 1 capsule (40 mg total) by mouth daily as needed. 90 capsule 0  . traZODone (DESYREL) 50 MG tablet Take 0.5-1 tablets (25-50 mg total) by mouth at bedtime as needed for sleep. 30 tablet 3  . vitamin B-12 (V-R VITAMIN B-12) 500 MCG tablet Take 1 tablet (500 mcg total) by mouth 3 (three) times a week.     No facility-administered medications prior to visit.     Per HPI unless specifically indicated in ROS section below Review of Systems Objective:  Pulse 73   Temp 98.1 F (36.7 C)   Ht 5\' 6"  (  1.676 m)   Wt 147 lb (66.7 kg)   SpO2 97%   BMI 23.73 kg/m   Wt Readings from Last 3 Encounters:  08/07/20 147 lb (66.7 kg)  06/03/20 147 lb 2 oz (66.7 kg)  03/21/20 145 lb (65.8 kg)       Physical exam: Gen: alert, NAD, not ill appearing Pulm: speaks in complete sentences without increased work of breathing Psych: normal mood, normal thought content      Results for orders placed or performed in visit on 06/07/20  HM MAMMOGRAPHY  Result Value Ref Range   HM Mammogram 0-4 Bi-Rad 0-4 Bi-Rad, Self Reported Normal   Assessment & Plan:   Problem  List Items Addressed This Visit    Ex-smoker   Emphysema/COPD ([place])   Relevant Medications   predniSONE (DELTASONE) 20 MG tablet   Acute bronchitis with COPD ([place]) - Primary    Increased dyspnea, cough, sputum production. Rx levaquin course (intolerance to other abx), prednisone taper. Continue albuterol PRN.  Does not get frequent resp infections.  Offered COVID testing, reviewed reasons to check as a positive result would change management recommendations, she declines.  Advised let us know if not improving with treatment. She agrees with plan.  Seek in person urgent care if worsening symptoms.       Relevant Medications   predniSONE (DELTASONE) 20 MG tablet       Meds ordered this encounter  Medications  . levofloxacin (LEVAQUIN) 500 MG tablet    Sig: Take 1 tablet (500 mg total) by mouth daily.    Dispense:  5 tablet    Refill:  0  . predniSONE (DELTASONE) 20 MG tablet    Sig: Take two tablets daily for 3 days followed by one tablet daily for 4 days    Dispense:  10 tablet    Refill:  0   No orders of the defined types were placed in this encounter.   I discussed the assessment and treatment plan with the patient. The patient was provided an opportunity to ask questions and all were answered. The patient agreed with the plan and demonstrated an understanding of the instructions. The patient was advised to call back or seek an in-person evaluation if the symptoms worsen or if the condition fails to improve as anticipated.  Follow up plan: No follow-ups on file.  Ria Bush, MD

## 2020-08-14 ENCOUNTER — Encounter: Payer: Self-pay | Admitting: Family Medicine

## 2020-08-16 ENCOUNTER — Other Ambulatory Visit (INDEPENDENT_AMBULATORY_CARE_PROVIDER_SITE_OTHER): Payer: Medicare HMO

## 2020-08-16 ENCOUNTER — Other Ambulatory Visit: Payer: Self-pay | Admitting: Family Medicine

## 2020-08-16 ENCOUNTER — Other Ambulatory Visit: Payer: Medicare HMO

## 2020-08-16 DIAGNOSIS — J440 Chronic obstructive pulmonary disease with acute lower respiratory infection: Secondary | ICD-10-CM

## 2020-08-16 DIAGNOSIS — J209 Acute bronchitis, unspecified: Secondary | ICD-10-CM | POA: Diagnosis not present

## 2020-08-16 MED ORDER — PREDNISONE 20 MG PO TABS: ORAL_TABLET | ORAL | 0 refills | Status: DC

## 2020-08-16 NOTE — Telephone Encounter (Signed)
plz call and schedule COVID test for this afternoon.  I also have sent in another prednisone course.  Let us know Monday if no better and I will order chest xray.

## 2020-08-16 NOTE — Telephone Encounter (Signed)
Spoke with pt scheduling car-side COVID test at 3:45.  I relayed Dr. Synthia Innocent message.  Pt verbalizes understanding.

## 2020-08-16 NOTE — Telephone Encounter (Signed)
Pt called triage. I advised her I would let Dr Darnell Level know she has called back, again.

## 2020-08-16 NOTE — Addendum Note (Signed)
Addended by: Ria Bush on: 08/16/2020 03:20 PM   Modules accepted: Orders

## 2020-08-20 LAB — SPECIMEN STATUS REPORT

## 2020-08-20 LAB — NOVEL CORONAVIRUS, NAA

## 2020-08-26 MED ORDER — ALPRAZOLAM 1 MG PO TABS: 1.0000 mg | ORAL_TABLET | Freq: Two times a day (BID) | ORAL | 0 refills | Status: DC | PRN

## 2020-08-26 NOTE — Addendum Note (Signed)
Addended by: Eustaquio Boyden on: 08/26/2020 07:39 AM   Modules accepted: Orders

## 2020-09-04 ENCOUNTER — Encounter: Payer: Self-pay | Admitting: Family Medicine

## 2020-09-04 ENCOUNTER — Other Ambulatory Visit: Payer: Self-pay

## 2020-09-04 ENCOUNTER — Ambulatory Visit (INDEPENDENT_AMBULATORY_CARE_PROVIDER_SITE_OTHER): Payer: Medicare HMO | Admitting: Family Medicine

## 2020-09-04 VITALS — BP 130/72 | HR 98 | Temp 97.0°F | Ht 66.0 in | Wt 144.5 lb

## 2020-09-04 DIAGNOSIS — K219 Gastro-esophageal reflux disease without esophagitis: Secondary | ICD-10-CM

## 2020-09-04 MED ORDER — SUCRALFATE 1 GM/10ML PO SUSP: 1.0000 g | Freq: Three times a day (TID) | ORAL | 0 refills | Status: DC

## 2020-09-04 MED ORDER — ESOMEPRAZOLE MAGNESIUM 40 MG PO CPDR: 40.0000 mg | DELAYED_RELEASE_CAPSULE | Freq: Every day | ORAL | 3 refills | Status: DC

## 2020-09-04 NOTE — Assessment & Plan Note (Addendum)
Acute worsening after prednisone, levaquin, holiday diet. Anticipate worsening GERD with possible gastritis/esophagitis or recurrent esophageal ring. Change PPI to nexium 40mg  daily, add pepcid at night, add carafate with meals for 1 wk. Reviewed diet and lifestyle options to help control GERD. Update with treatment effect next week.  Will refer to GI for further eval for possible recurrent stricture/ring.  Not consistent with cardiac cause, lungs clear.

## 2020-09-04 NOTE — Patient Instructions (Addendum)
Change from omeprazole to nexium 40mg  daily Take carafate suspension with meals for the next week.  Stop anti inflammatories like aleve and ibuprofen.  Take tylenol for discomfort as needed.  Head of bed elevated. Avoidance of citrus, fatty foods, chocolate, peppermint, and excessive alcohol, along with sodas, orange juice (acidic drinks) At least a few hours between dinner and bed, minimize naps after eating.  We will refer you back to GI.

## 2020-09-04 NOTE — Progress Notes (Signed)
Patient ID: Rachel Mcintosh, female    DOB: 06/07/1956, 65 y.o.   MRN: [id number]  This visit was conducted in person.  BP 130/72 (BP Location: Right Arm, Patient Position: Sitting, Cuff Size: Normal)   Pulse 98   Temp (!) 97 F (36.1 C) (Temporal)   Ht 5\' 6"  (1.676 m)   Wt 144 lb 8 oz (65.5 kg)   SpO2 96%   BMI 23.32 kg/m    CC: discuss GERD Subjective:   HPI: Rachel RailDonna Cable Sabel is a 65 y.o. female presenting on 09/04/2020 for Gastroesophageal Reflux   3-4 wk h/o aggravating chest discomfort, food regurgitation, food feels stuck in mid chest for a few days after eating. Heartburn, water brash. Points to thoracic chest. No postprandial emesis. Increased burping but no bloating/indigestion, abd pain, diarrhea/constipation. No fevers, cough. Some epigastric discomfort.  Acutely worse with pepsi, coffee, solid foods, discomfort seems related to meals. Has had trouble swallowing large pills for a long time.  Acutely worse after 2 recent prednisone courses, levaquin antibiotic, and holiday diet.  No unexpected weight loss, dysphagia, early satiety, nausea/vomiting.   Currently on omeprazole 40mg  daily for the past month - without any improvement.   Just started aleve 2d ago to see uif this would help.  Alcohol - none Stopped coffee.  Drinks 1 pepsi/day.   EGD 2016 - 2cm HH, esophageal ring dilated Leone Payor([facility])      Relevant past medical, surgical, family and social history reviewed and updated as indicated. Interim medical history since our last visit reviewed. Allergies and medications reviewed and updated. Outpatient Medications Prior to Visit  Medication Sig Dispense Refill  . albuterol (VENTOLIN HFA) 108 (90 Base) MCG/ACT inhaler Inhale 2 puffs into the lungs every 6 (six) hours as needed for wheezing or shortness of breath. 18 g 3  . ALPRAZolam (XANAX) 1 MG tablet Take 1 tablet (1 mg total) by mouth 2 (two) times daily as needed for anxiety. 30 tablet 0  . Calcium  Carbonate-Vitamin D3 (CALCIUM 600/VITAMIN D) 600-400 MG-UNIT TABS Take 1 tablet by mouth daily.    . fluticasone (FLONASE) 50 MCG/ACT nasal spray Place 2 sprays into both nostrils daily. 16 g 6  . Naproxen Sodium (ALEVE PO) Take 1 tablet by mouth as needed.    . traZODone (DESYREL) 50 MG tablet Take 0.5-1 tablets (25-50 mg total) by mouth at bedtime as needed for sleep. 30 tablet 3  . vitamin B-12 (V-R VITAMIN B-12) 500 MCG tablet Take 1 tablet (500 mcg total) by mouth 3 (three) times a week.    Marland Kitchen. omeprazole (PRILOSEC) 40 MG capsule Take 1 capsule (40 mg total) by mouth daily as needed. 90 capsule 0  . LORazepam (ATIVAN) 1 MG tablet Take 0.5-1 tablets (0.5-1 mg total) by mouth at bedtime as needed for sleep. 30 tablet 1  . predniSONE (DELTASONE) 20 MG tablet Take two tablets daily for 4 days followed by one tablet daily for 4 days 12 tablet 0   No facility-administered medications prior to visit.     Per HPI unless specifically indicated in ROS section below Review of Systems Objective:  BP 130/72 (BP Location: Right Arm, Patient Position: Sitting, Cuff Size: Normal)   Pulse 98   Temp (!) 97 F (36.1 C) (Temporal)   Ht 5\' 6"  (1.676 m)   Wt 144 lb 8 oz (65.5 kg)   SpO2 96%   BMI 23.32 kg/m   Wt Readings from Last 3 Encounters:  09/04/20 144 lb  8 oz (65.5 kg)  08/07/20 147 lb (66.7 kg)  06/03/20 147 lb 2 oz (66.7 kg)      Physical Exam Vitals and nursing note reviewed.  Constitutional:      Appearance: Normal appearance. She is normal weight. She is not ill-appearing.  Cardiovascular:     Rate and Rhythm: Normal rate and regular rhythm.     Pulses: Normal pulses.     Heart sounds: Normal heart sounds. No murmur heard.   Pulmonary:     Effort: Pulmonary effort is normal. No respiratory distress.     Breath sounds: Normal breath sounds. No wheezing, rhonchi or rales.  Abdominal:     General: Abdomen is flat. Bowel sounds are normal. There is no distension.     Palpations:  Abdomen is soft. There is no mass.     Tenderness: There is no abdominal tenderness. There is no guarding or rebound. Negative signs include Murphy's sign.     Hernia: No hernia is present.  Musculoskeletal:        General: Normal range of motion.     Right lower leg: No edema.     Left lower leg: No edema.  Skin:    General: Skin is warm and dry.     Findings: No rash.  Neurological:     Mental Status: She is alert.  Psychiatric:        Mood and Affect: Mood normal.        Behavior: Behavior normal.       Results for orders placed or performed in visit on 08/16/20  Novel Coronavirus, NAA ([facility])   Specimen: Nasopharyngeal(NP) swabs in vial transport medium   Nasopharynge  Result Value Ref Range   SARS-CoV-2, NAA Comment Not Detected  Specimen status report  Result Value Ref Range   specimen status report Comment    Assessment & Plan:  This visit occurred during the SARS-CoV-2 public health emergency.  Safety protocols were in place, including screening questions prior to the visit, additional usage of staff PPE, and extensive cleaning of exam room while observing appropriate contact time as indicated for disinfecting solutions.   Problem List Items Addressed This Visit    GERD (gastroesophageal reflux disease) - Primary    Acute worsening after prednisone, levaquin, holiday diet. Anticipate worsening GERD with possible gastritis/esophagitis or recurrent esophageal ring. Change PPI to nexium 40mg  daily, add pepcid at night, add carafate with meals for 1 wk. Reviewed diet and lifestyle options to help control GERD. Update with treatment effect next week.  Will refer to GI for further eval for possible recurrent stricture/ring.  Not consistent with cardiac cause, lungs clear.       Relevant Medications   esomeprazole (NEXIUM) 40 MG capsule   sucralfate (CARAFATE) 1 GM/10ML suspension   Other Relevant Orders   Ambulatory referral to Gastroenterology       Meds ordered  this encounter  Medications  . esomeprazole (NEXIUM) 40 MG capsule    Sig: Take 1 capsule (40 mg total) by mouth daily.    Dispense:  30 capsule    Refill:  3    In place of omeprazole  . sucralfate (CARAFATE) 1 GM/10ML suspension    Sig: Take 10 mLs (1 g total) by mouth 3 (three) times daily before meals. As needed    Dispense:  420 mL    Refill:  0   Orders Placed This Encounter  Procedures  . Ambulatory referral to Gastroenterology    Referral Priority:  Routine    Referral Type:   Consultation    Referral Reason:   Specialty Services Required    Number of Visits Requested:   1    Patient Instructions  Change from omeprazole to nexium 40mg  daily Take carafate suspension with meals for the next week.  Stop anti inflammatories like aleve and ibuprofen.  Take tylenol for discomfort as needed.  Head of bed elevated. Avoidance of citrus, fatty foods, chocolate, peppermint, and excessive alcohol, along with sodas, orange juice (acidic drinks) At least a few hours between dinner and bed, minimize naps after eating.  We will refer you back to GI.    Follow up plan: Return if symptoms worsen or fail to improve.  , MD

## 2020-09-05 MED ORDER — PANTOPRAZOLE SODIUM 40 MG PO TBEC: DELAYED_RELEASE_TABLET | ORAL | 3 refills | Status: DC

## 2020-09-05 MED ORDER — SUCRALFATE 1 G PO TABS: 1.0000 g | ORAL_TABLET | Freq: Three times a day (TID) | ORAL | 0 refills | Status: DC

## 2020-10-03 ENCOUNTER — Other Ambulatory Visit: Payer: Self-pay | Admitting: Family Medicine

## 2020-10-06 ENCOUNTER — Other Ambulatory Visit: Payer: Self-pay | Admitting: Family Medicine

## 2020-10-07 ENCOUNTER — Encounter: Payer: Self-pay | Admitting: Family Medicine

## 2020-10-07 ENCOUNTER — Other Ambulatory Visit: Payer: Self-pay

## 2020-10-07 ENCOUNTER — Ambulatory Visit (INDEPENDENT_AMBULATORY_CARE_PROVIDER_SITE_OTHER): Payer: Medicare HMO | Admitting: Family Medicine

## 2020-10-07 VITALS — BP 124/64 | HR 73 | Temp 96.9°F | Ht 66.0 in | Wt 144.4 lb

## 2020-10-07 DIAGNOSIS — N3001 Acute cystitis with hematuria: Secondary | ICD-10-CM | POA: Diagnosis not present

## 2020-10-07 DIAGNOSIS — R319 Hematuria, unspecified: Secondary | ICD-10-CM

## 2020-10-07 LAB — POC URINALSYSI DIPSTICK (AUTOMATED)
Blood, UA: 200
Spec Grav, UA: 1.025 (ref 1.010–1.025)

## 2020-10-07 MED ORDER — CEPHALEXIN 500 MG PO CAPS: 500.0000 mg | ORAL_CAPSULE | Freq: Two times a day (BID) | ORAL | 0 refills | Status: DC

## 2020-10-07 NOTE — Telephone Encounter (Signed)
Pharmacy requests refill on: Alprazolam 1 mg   LAST REFILL: 08/26/2020 (Q-30, R-0) LAST OV: 09/04/2020 NEXT OV: Not Scheduled  PHARMACY: [facility] [place], [place]
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
## 2020-10-07 NOTE — Patient Instructions (Addendum)
Drink lots of water  Take the generic keflex as directed   If symptoms suddenly worsen or change let us know   We will contact you with a culture result    Urinary Tract Infection, Adult  A urinary tract infection (UTI) is an infection of any part of the urinary tract. The urinary tract includes the kidneys, ureters, bladder, and urethra. These organs make, store, and get rid of urine in the body. An upper UTI affects the ureters and kidneys. A lower UTI affects the bladder and urethra. What are the causes? Most urinary tract infections are caused by bacteria in your genital area around your urethra, where urine leaves your body. These bacteria grow and cause inflammation of your urinary tract. What increases the risk? You are more likely to develop this condition if:  You have a urinary catheter that stays in place.  You are not able to control when you urinate or have a bowel movement (incontinence).  You are female and you: ? Use a spermicide or diaphragm for birth control. ? Have low estrogen levels. ? Are pregnant.  You have certain genes that increase your risk.  You are sexually active.  You take antibiotic medicines.  You have a condition that causes your flow of urine to slow down, such as: ? An enlarged prostate, if you are female. ? Blockage in your urethra. ? A kidney stone. ? A nerve condition that affects your bladder control (neurogenic bladder). ? Not getting enough to drink, or not urinating often.  You have certain medical conditions, such as: ? Diabetes. ? A weak disease-fighting system (immunesystem). ? Sickle cell disease. ? Gout. ? Spinal cord injury. What are the signs or symptoms? Symptoms of this condition include:  Needing to urinate right away (urgency).  Frequent urination. This may include small amounts of urine each time you urinate.  Pain or burning with urination.  Blood in the urine.  Urine that smells bad or unusual.  Trouble  urinating.  Cloudy urine.  Vaginal discharge, if you are female.  Pain in the abdomen or the lower back. You may also have:  Vomiting or a decreased appetite.  Confusion.  Irritability or tiredness.  A fever or chills.  Diarrhea. The first symptom in older adults may be confusion. In some cases, they may not have any symptoms until the infection has worsened. How is this diagnosed? This condition is diagnosed based on your medical history and a physical exam. You may also have other tests, including:  Urine tests.  Blood tests.  Tests for STIs (sexually transmitted infections). If you have had more than one UTI, a cystoscopy or imaging studies may be done to determine the cause of the infections. How is this treated? Treatment for this condition includes:  Antibiotic medicine.  Over-the-counter medicines to treat discomfort.  Drinking enough water to stay hydrated. If you have frequent infections or have other conditions such as a kidney stone, you may need to see a health care provider who specializes in the urinary tract (urologist). In rare cases, urinary tract infections can cause sepsis. Sepsis is a life-threatening condition that occurs when the body responds to an infection. Sepsis is treated in the hospital with IV antibiotics, fluids, and other medicines. Follow these instructions at home: Medicines  Take over-the-counter and prescription medicines only as told by your health care provider.  If you were prescribed an antibiotic medicine, take it as told by your health care provider. Do not stop using the  antibiotic even if you start to feel better. General instructions  Make sure you: ? Empty your bladder often and completely. Do not hold urine for long periods of time. ? Empty your bladder after sex. ? Wipe from front to back after urinating or having a bowel movement if you are female. Use each tissue only one time when you wipe.  Drink enough fluid to  keep your urine pale yellow.  Keep all follow-up visits. This is important.   Contact a health care provider if:  Your symptoms do not get better after 1-2 days.  Your symptoms go away and then return. Get help right away if:  You have severe pain in your back or your lower abdomen.  You have a fever or chills.  You have nausea or vomiting. Summary  A urinary tract infection (UTI) is an infection of any part of the urinary tract, which includes the kidneys, ureters, bladder, and urethra.  Most urinary tract infections are caused by bacteria in your genital area.  Treatment for this condition often includes antibiotic medicines.  If you were prescribed an antibiotic medicine, take it as told by your health care provider. Do not stop using the antibiotic even if you start to feel better.  Keep all follow-up visits. This is important. This information is not intended to replace advice given to you by your health care provider. Make sure you discuss any questions you have with your health care provider. Document Revised: 03/29/2020 Document Reviewed: 03/29/2020 Elsevier Patient Education  [place].

## 2020-10-07 NOTE — Assessment & Plan Note (Signed)
Pt has h/o IC but today has pos ua with blood  Px keflex  Enc water intake  ua pos-sent for cx   Handout given  inst to call if symptoms worsen or change in the meantime

## 2020-10-08 LAB — URINE CULTURE
MICRO NUMBER:: 11502950
Result:: NO GROWTH
SPECIMEN QUALITY:: ADEQUATE

## 2020-10-09 NOTE — Telephone Encounter (Signed)
ERx plz notify patient 

## 2020-10-09 NOTE — Telephone Encounter (Signed)
Pt is out of medication and they told her that she needs a new prescription

## 2020-10-10 NOTE — Telephone Encounter (Signed)
Left message on vm per dpr notifying pt rx was sent to pharmacy.

## 2020-10-14 ENCOUNTER — Ambulatory Visit: Payer: Medicare HMO | Admitting: Internal Medicine

## 2020-10-17 ENCOUNTER — Ambulatory Visit: Payer: Medicare HMO | Admitting: Internal Medicine

## 2020-10-17 ENCOUNTER — Encounter: Payer: Self-pay | Admitting: Internal Medicine

## 2020-10-17 VITALS — BP 136/82 | HR 76 | Ht 66.0 in | Wt 144.0 lb

## 2020-10-17 DIAGNOSIS — R1310 Dysphagia, unspecified: Secondary | ICD-10-CM | POA: Diagnosis not present

## 2020-10-17 DIAGNOSIS — R0989 Other specified symptoms and signs involving the circulatory and respiratory systems: Secondary | ICD-10-CM

## 2020-10-17 DIAGNOSIS — R198 Other specified symptoms and signs involving the digestive system and abdomen: Secondary | ICD-10-CM

## 2020-10-17 DIAGNOSIS — Z636 Dependent relative needing care at home: Secondary | ICD-10-CM | POA: Diagnosis not present

## 2020-10-17 DIAGNOSIS — R69 Illness, unspecified: Secondary | ICD-10-CM | POA: Diagnosis not present
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
## 2020-10-17 NOTE — Patient Instructions (Signed)
You have been scheduled for an endoscopy. Please follow written instructions given to you at your visit today. ?If you use inhalers (even only as needed), please bring them with you on the day of your procedure. ? ?Due to recent changes in healthcare laws, you may see the results of your imaging and laboratory studies on MyChart before your provider has had a chance to review them.  We understand that in some cases there may be results that are confusing or concerning to you. Not all laboratory results come back in the same time frame and the provider may be waiting for multiple results in order to interpret others.  Please give us 48 hours in order for your provider to thoroughly review all the results before contacting the office for clarification of your results.  ? ?I appreciate the opportunity to care for you. ?Carl Gessner, MD, FACG ?

## 2020-10-21 DIAGNOSIS — H524 Presbyopia: Secondary | ICD-10-CM | POA: Diagnosis not present

## 2020-10-21 DIAGNOSIS — H0288A Meibomian gland dysfunction right eye, upper and lower eyelids: Secondary | ICD-10-CM | POA: Diagnosis not present

## 2020-10-21 DIAGNOSIS — H04123 Dry eye syndrome of bilateral lacrimal glands: Secondary | ICD-10-CM | POA: Diagnosis not present

## 2020-10-21 DIAGNOSIS — H52213 Irregular astigmatism, bilateral: Secondary | ICD-10-CM | POA: Diagnosis not present

## 2020-10-21 DIAGNOSIS — H5213 Myopia, bilateral: Secondary | ICD-10-CM | POA: Diagnosis not present

## 2020-10-21 DIAGNOSIS — H0288B Meibomian gland dysfunction left eye, upper and lower eyelids: Secondary | ICD-10-CM | POA: Diagnosis not present

## 2020-10-25 ENCOUNTER — Encounter: Payer: Self-pay | Admitting: Family Medicine

## 2020-10-25 ENCOUNTER — Ambulatory Visit (INDEPENDENT_AMBULATORY_CARE_PROVIDER_SITE_OTHER): Payer: Medicare HMO | Admitting: Family Medicine

## 2020-10-25 ENCOUNTER — Other Ambulatory Visit: Payer: Self-pay

## 2020-10-25 VITALS — BP 106/64 | HR 85 | Temp 97.0°F | Ht 66.0 in | Wt 143.1 lb

## 2020-10-25 DIAGNOSIS — R300 Dysuria: Secondary | ICD-10-CM

## 2020-10-25 DIAGNOSIS — N3020 Other chronic cystitis without hematuria: Secondary | ICD-10-CM

## 2020-10-25 DIAGNOSIS — R102 Pelvic and perineal pain: Secondary | ICD-10-CM | POA: Insufficient documentation

## 2020-10-25 DIAGNOSIS — N3001 Acute cystitis with hematuria: Secondary | ICD-10-CM

## 2020-10-25 LAB — POCT UA - MICROSCOPIC ONLY

## 2020-10-25 LAB — POC URINALSYSI DIPSTICK (AUTOMATED)
Spec Grav, UA: >=1.03 — AB (ref 1.010–1.025)
pH, UA: 6 (ref 5.0–8.0)

## 2020-10-25 LAB — POCT WET PREP (WET MOUNT): Trichomonas Wet Prep HPF POC: ABSENT

## 2020-10-25 MED ORDER — METRONIDAZOLE 0.75 % VA GEL: 1.0000 | Freq: Two times a day (BID) | VAGINAL | 0 refills | Status: DC

## 2020-10-25 MED ORDER — FLUCONAZOLE 150 MG PO TABS: 150.0000 mg | ORAL_TABLET | Freq: Once | ORAL | 0 refills | Status: AC

## 2020-10-25 NOTE — Patient Instructions (Addendum)
We will send urine for culture and call you with a result   I think you have a yeast vaginitis and bacterial vaginosis  Take the diflucan one time orally for yeast Use the metro gel vaginal as directed  Update if not starting to improve in a week or if worsening    Drink lots of water
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
## 2020-10-26 LAB — URINE CULTURE
MICRO NUMBER:: 11580355
SPECIMEN QUALITY:: ADEQUATE

## 2020-10-26 NOTE — Assessment & Plan Note (Signed)
Vaginal discomfort with dysuria  Urine cx pending  Yeast and clue cells on wet prep  tx with metro gel vaginal and diflucan Update if not starting to improve in a week or if worsening    Meds ordered this encounter  Medications  . fluconazole (DIFLUCAN) 150 MG tablet    Sig: Take 1 tablet (150 mg total) by mouth once for 1 dose.    Dispense:  1 tablet    Refill:  0  . metroNIDAZOLE (METROGEL VAGINAL) 0.75 % vaginal gel    Sig: Place 1 Applicatorful vaginally 2 (two) times daily.    Dispense:  70 g    Refill:  0

## 2020-10-26 NOTE — Assessment & Plan Note (Signed)
Last bout of symptoms improved with keflex but cx was neg  ? Of IC  ua micro has rbc and wbc today  Sent for cx  Unsure if contaminated with vag d/c

## 2020-10-26 NOTE — Assessment & Plan Note (Signed)
Unsure if due to uti, IC or vaginitis ucx sent (pos ua) tx for yeast and bv Pending urine cx Update if not starting to improve in a week or if worsening

## 2020-11-13 ENCOUNTER — Encounter: Payer: Self-pay | Admitting: Family Medicine

## 2020-11-13 ENCOUNTER — Other Ambulatory Visit: Payer: Self-pay

## 2020-11-13 ENCOUNTER — Ambulatory Visit (INDEPENDENT_AMBULATORY_CARE_PROVIDER_SITE_OTHER): Payer: Medicare HMO | Admitting: Family Medicine

## 2020-11-13 VITALS — BP 120/74 | HR 83 | Temp 97.1°F | Ht 66.0 in | Wt 143.5 lb

## 2020-11-13 DIAGNOSIS — R300 Dysuria: Secondary | ICD-10-CM | POA: Diagnosis not present

## 2020-11-13 DIAGNOSIS — R69 Illness, unspecified: Secondary | ICD-10-CM | POA: Diagnosis not present

## 2020-11-13 DIAGNOSIS — Z636 Dependent relative needing care at home: Secondary | ICD-10-CM | POA: Diagnosis not present

## 2020-11-13 DIAGNOSIS — N898 Other specified noninflammatory disorders of vagina: Secondary | ICD-10-CM | POA: Insufficient documentation

## 2020-11-13 DIAGNOSIS — N3020 Other chronic cystitis without hematuria: Secondary | ICD-10-CM | POA: Diagnosis not present

## 2020-11-13 LAB — POC URINALSYSI DIPSTICK (AUTOMATED)
Bilirubin, UA: NEGATIVE
Glucose, UA: NEGATIVE
Ketones, UA: NEGATIVE
Leukocytes, UA: NEGATIVE
Nitrite, UA: NEGATIVE
Protein, UA: NEGATIVE
Spec Grav, UA: 1.015 (ref 1.010–1.025)
Urobilinogen, UA: 0.2 E.U./dL
pH, UA: 7 (ref 5.0–8.0)

## 2020-11-13 MED ORDER — AMITRIPTYLINE HCL 25 MG PO TABS: 25.0000 mg | ORAL_TABLET | Freq: Every day | ORAL | 3 refills | Status: DC

## 2020-11-13 NOTE — Assessment & Plan Note (Signed)
Trazodone not helping.. stop and start amitriptyline.

## 2020-11-13 NOTE — Assessment & Plan Note (Signed)
Eval with send out wet prep to determine if BV and candida resolved.

## 2020-11-13 NOTE — Assessment & Plan Note (Signed)
Interstitial cystitis most likely.  Encouraged pt to decrease acidic foods,  Increase water.  Given she is tearful about needing a " quick fix" for symptoms given mother with dementia home a lone and additional stressors... will try trial of amitriptyline at bedtime. This may helps with cystitis, mood and sleep.  Referred back to Urology for further recommendations.

## 2020-11-13 NOTE — Assessment & Plan Note (Signed)
Normal UA and 2 previous clear urine cultures.

## 2020-11-13 NOTE — Progress Notes (Signed)
Patient ID: Rachel Mcintosh, female    DOB: 09-Jun-1956, 65 y.o.   MRN: [id number]  This visit was conducted in person.  BP 120/74   Pulse 83   Temp (!) 97.1 F (36.2 C) (Temporal)   Ht 5\' 6"  (1.676 m)   Wt 143 lb 8 oz (65.1 kg)   SpO2 97%   BMI 23.16 kg/m    CC:  Chief Complaint  Patient presents with  . Burning with Urination    Seen by Dr. Glori Bickers on 10/07/2020 and 10/25/2020 for same symptoms  . Bladder Pressure    Subjective:   HPI: Rachel Mcintosh is a 65 y.o. female presenting on 11/13/2020 for Burning with Urination (Seen by Dr. Glori Bickers on 10/07/2020 and 10/25/2020 for same symptoms) and Bladder Pressure   At recent OV in last month... has seen Dr. Glori Bickers 2 times for similar issue. Urine culture negative x 2 Treated for BV and yeast with fluconazole and metronidazole.   She has recurrent dysuria and  bladder pressure... started back in last few days. No further vaginal discharge currently.  No vaginal itching.    Has seen Dr. Chrystine Oiler [facility] in past  for chronic cystitis. Cysctoscopy was normal.   No change in diet, former smoker. Drinks lots of water.  Relevant past medical, surgical, family and social history reviewed and updated as indicated. Interim medical history since our last visit reviewed. Allergies and medications reviewed and updated. Outpatient Medications Prior to Visit  Medication Sig Dispense Refill  . albuterol (VENTOLIN HFA) 108 (90 Base) MCG/ACT inhaler Inhale 2 puffs into the lungs every 6 (six) hours as needed for wheezing or shortness of breath. 18 g 3  . ALPRAZolam (XANAX) 1 MG tablet TAKE 1 TABLET BY MOUTH 2 TIMES DAILY AS NEEDED FOR ANXIETY. 30 tablet 0  . Calcium Carbonate-Vitamin D3 (CALCIUM 600/VITAMIN D) 600-400 MG-UNIT TABS Take 1 tablet by mouth daily.    . famotidine (PEPCID) 20 MG tablet Take 20 mg by mouth daily as needed for heartburn or indigestion.    . fluticasone (FLONASE) 50 MCG/ACT nasal spray Place 2 sprays into both  nostrils daily. 16 g 6  . Naproxen Sodium (ALEVE PO) Take 1 tablet by mouth as needed.    . traZODone (DESYREL) 50 MG tablet Take 0.5-1 tablets (25-50 mg total) by mouth at bedtime as needed for sleep. 30 tablet 3  . vitamin B-12 (V-R VITAMIN B-12) 500 MCG tablet Take 1 tablet (500 mcg total) by mouth 3 (three) times a week.    . metroNIDAZOLE (METROGEL VAGINAL) 0.75 % vaginal gel Place 1 Applicatorful vaginally 2 (two) times daily. 70 g 0   No facility-administered medications prior to visit.     Per HPI unless specifically indicated in ROS section below Review of Systems  Constitutional: Negative for fatigue and fever.  HENT: Negative for ear pain.   Eyes: Negative for pain.  Respiratory: Negative for chest tightness and shortness of breath.   Cardiovascular: Negative for chest pain, palpitations and leg swelling.  Gastrointestinal: Negative for abdominal pain.  Genitourinary: Positive for dysuria.   Objective:  BP 120/74   Pulse 83   Temp (!) 97.1 F (36.2 C) (Temporal)   Ht 5\' 6"  (1.676 m)   Wt 143 lb 8 oz (65.1 kg)   SpO2 97%   BMI 23.16 kg/m   Wt Readings from Last 3 Encounters:  11/13/20 143 lb 8 oz (65.1 kg)  10/25/20 143 lb 2 oz (64.9 kg)  10/17/20 144 lb (65.3 kg)      Physical Exam Constitutional:      General: She is not in acute distress.    Appearance: Normal appearance. She is well-developed. She is not ill-appearing or toxic-appearing.  HENT:     Head: Normocephalic.     Right Ear: Hearing, tympanic membrane, ear canal and external ear normal. Tympanic membrane is not erythematous, retracted or bulging.     Left Ear: Hearing, tympanic membrane, ear canal and external ear normal. Tympanic membrane is not erythematous, retracted or bulging.     Nose: No mucosal edema or rhinorrhea.     Right Sinus: No maxillary sinus tenderness or frontal sinus tenderness.     Left Sinus: No maxillary sinus tenderness or frontal sinus tenderness.     Mouth/Throat:      Pharynx: Uvula midline.  Eyes:     General: Lids are normal. Lids are everted, no foreign bodies appreciated.     Conjunctiva/sclera: Conjunctivae normal.     Pupils: Pupils are equal, round, and reactive to light.  Neck:     Thyroid: No thyroid mass or thyromegaly.     Vascular: No carotid bruit.     Trachea: Trachea normal.  Cardiovascular:     Rate and Rhythm: Normal rate and regular rhythm.     Pulses: Normal pulses.     Heart sounds: Normal heart sounds, S1 normal and S2 normal. No murmur heard. No friction rub. No gallop.   Pulmonary:     Effort: Pulmonary effort is normal. No tachypnea or respiratory distress.     Breath sounds: Normal breath sounds. No decreased breath sounds, wheezing, rhonchi or rales.  Abdominal:     General: Bowel sounds are normal.     Palpations: Abdomen is soft.     Tenderness: There is no abdominal tenderness.  Musculoskeletal:     Cervical back: Normal range of motion and neck supple.  Skin:    General: Skin is warm and dry.     Findings: No rash.  Neurological:     Mental Status: She is alert.  Psychiatric:        Mood and Affect: Mood is not anxious or depressed.        Speech: Speech normal.        Behavior: Behavior normal. Behavior is cooperative.        Thought Content: Thought content normal.        Judgment: Judgment normal.       Results for orders placed or performed in visit on 11/13/20  POCT Urinalysis Dipstick (Automated)  Result Value Ref Range   Color, UA Yellow    Clarity, UA Clear    Glucose, UA Negative Negative   Bilirubin, UA Negative    Ketones, UA Negative    Spec Grav, UA 1.015 1.010 - 1.025   Blood, UA Trace    pH, UA 7.0 5.0 - 8.0   Protein, UA Negative Negative   Urobilinogen, UA 0.2 0.2 or 1.0 E.U./dL   Nitrite, UA Negative    Leukocytes, UA Negative Negative    This visit occurred during the SARS-CoV-2 public health emergency.  Safety protocols were in place, including screening questions prior to the  visit, additional usage of staff PPE, and extensive cleaning of exam room while observing appropriate contact time as indicated for disinfecting solutions.   COVID 19 screen:  No recent travel or known exposure to COVID19 The patient denies respiratory symptoms of COVID 19 at this time.  The importance of social distancing was discussed today.   Assessment and Plan Problem List Items Addressed This Visit    Burning with urination - Primary     Normal UA and 2 previous clear urine cultures.      Relevant Orders   POCT Urinalysis Dipstick (Automated) (Completed)   WET PREP BY MOLECULAR PROBE   Caregiver stress    Trazodone not helping.. stop and start amitriptyline.      Chronic cystitis    Interstitial cystitis most likely.  Encouraged pt to decrease acidic foods,  Increase water.  Given she is tearful about needing a " quick fix" for symptoms given mother with dementia home a lone and additional stressors... will try trial of amitriptyline at bedtime. This may helps with cystitis, mood and sleep.  Referred back to Urology for further recommendations.      Relevant Orders   Ambulatory referral to Urology   Vaginal discharge    Eval with send out wet prep to determine if BV and candida resolved.        Meds ordered this encounter  Medications  . amitriptyline (ELAVIL) 25 MG tablet    Sig: Take 1 tablet (25 mg total) by mouth at bedtime.    Dispense:  30 tablet    Refill:  3    Orders Placed This Encounter  Procedures  . WET PREP BY MOLECULAR PROBE  . Ambulatory referral to Urology    Referral Priority:   Routine    Referral Type:   Consultation    Referral Reason:   Specialty Services Required    Requested Specialty:   Urology    Number of Visits Requested:   1  . POCT Urinalysis Dipstick (Automated)       Eliezer Lofts, MD

## 2020-11-13 NOTE — Patient Instructions (Addendum)
Avoid bladder irritants as discussed.   We will set up referral to Urologist.  WE will call with vaginal swab send out results.  Start trial of amitriptyline at bedtime for bladder irritability and sleep.  Do not take with trazodone... stop as ineffective.

## 2020-11-14 LAB — WET PREP BY MOLECULAR PROBE
Candida species: NOT DETECTED
Gardnerella vaginalis: NOT DETECTED
MICRO NUMBER:: 11654653
SPECIMEN QUALITY:: ADEQUATE
Trichomonas vaginosis: NOT DETECTED

## 2020-12-02 ENCOUNTER — Telehealth: Payer: Self-pay | Admitting: Internal Medicine

## 2020-12-02 NOTE — Telephone Encounter (Signed)
I spoke with Rachel Mcintosh and told her there is no prep kit for an EGD and I also told her she doesn't have to go for her COVID testing. It is no longer required for the [facility].

## 2020-12-04 ENCOUNTER — Other Ambulatory Visit: Payer: Self-pay | Admitting: Family Medicine

## 2020-12-05 ENCOUNTER — Other Ambulatory Visit: Payer: Self-pay

## 2020-12-05 ENCOUNTER — Ambulatory Visit (AMBULATORY_SURGERY_CENTER): Payer: Medicare HMO | Admitting: Internal Medicine

## 2020-12-05 ENCOUNTER — Encounter: Payer: Self-pay | Admitting: Internal Medicine

## 2020-12-05 VITALS — BP 126/71 | HR 66 | Temp 96.8°F | Resp 17 | Ht 66.0 in | Wt 144.0 lb

## 2020-12-05 DIAGNOSIS — R69 Illness, unspecified: Secondary | ICD-10-CM | POA: Diagnosis not present

## 2020-12-05 DIAGNOSIS — K449 Diaphragmatic hernia without obstruction or gangrene: Secondary | ICD-10-CM | POA: Diagnosis not present

## 2020-12-05 DIAGNOSIS — R1310 Dysphagia, unspecified: Secondary | ICD-10-CM

## 2020-12-05 DIAGNOSIS — J449 Chronic obstructive pulmonary disease, unspecified: Secondary | ICD-10-CM | POA: Diagnosis not present

## 2020-12-05 DIAGNOSIS — K222 Esophageal obstruction: Secondary | ICD-10-CM

## 2020-12-05 DIAGNOSIS — K219 Gastro-esophageal reflux disease without esophagitis: Secondary | ICD-10-CM | POA: Diagnosis not present

## 2020-12-05 DIAGNOSIS — Q394 Esophageal web: Secondary | ICD-10-CM

## 2020-12-05 MED ORDER — SODIUM CHLORIDE 0.9 % IV SOLN: 500.0000 mL | Freq: Once | INTRAVENOUS | Status: DC

## 2020-12-05 NOTE — Patient Instructions (Addendum)
I found what we call a web at the top of the esophagus - a small area of scar tissue causing narrowing and swallowing problems. I dilated this and think it will help you. I also dilated the lower esophagus again.  Please send me a my Chart message in a week or two and let me know how you are doing.  I appreciate the opportunity to care for you. Gatha Mayer, MD, [facility] Clear liquids for one hours then soft diet For rest of today.  Post dilation diet given Continue current medications Message Dr Carlean Purl in 1-2 weeks YOU HAD AN ENDOSCOPIC PROCEDURE TODAY AT [place]:   Refer to the procedure report that was given to you for any specific questions about what was found during the examination.  If the procedure report does not answer your questions, please call your gastroenterologist to clarify.  If you requested that your care partner not be given the details of your procedure findings, then the procedure report has been included in a sealed envelope for you to review at your convenience later.  YOU SHOULD EXPECT: Some feelings of bloating in the abdomen. Passage of more gas than usual.  Walking can help get rid of the air that was put into your GI tract during the procedure and reduce the bloating. If you had a lower endoscopy (such as a colonoscopy or flexible sigmoidoscopy) you may notice spotting of blood in your stool or on the toilet paper. If you underwent a bowel prep for your procedure, you may not have a normal bowel movement for a few days.  Please Note:  You might notice some irritation and congestion in your nose or some drainage.  This is from the oxygen used during your procedure.  There is no need for concern and it should clear up in a day or so.  SYMPTOMS TO REPORT IMMEDIATELY:   Following upper endoscopy (EGD)  Vomiting of blood or coffee ground material  New chest pain or pain under the shoulder blades  Painful or persistently difficult swallowing  New  shortness of breath  Fever of 100F or higher  Black, tarry-looking stools  For urgent or emergent issues, a gastroenterologist can be reached at any hour by calling [phone]. Do not use MyChart messaging for urgent concerns.  DIET:  We do recommend a small meal at first, but then you may proceed to your regular diet.  Drink plenty of fluids but you should avoid alcoholic beverages for 24 hours.  ACTIVITY:  You should plan to take it easy for the rest of today and you should NOT DRIVE or use heavy machinery until tomorrow (because of the sedation medicines used during the test).    FOLLOW UP: Our staff will call the number listed on your records 48-72 hours following your procedure to check on you and address any questions or concerns that you may have regarding the information given to you following your procedure. If we do not reach you, we will leave a message.  We will attempt to reach you two times.  During this call, we will ask if you have developed any symptoms of COVID 19. If you develop any symptoms (ie: fever, flu-like symptoms, shortness of breath, cough etc.) before then, please call [phone].  If you test positive for Covid 19 in the 2 weeks post procedure, please call and report this information to [place].    If any biopsies were taken you will be contacted by phone  or by letter within the next 1-3 weeks.  Please call us at [phone] if you have not heard about the biopsies in 3 weeks.   SIGNATURES/CONFIDENTIALITY: You and/or your care partner have signed paperwork which will be entered into your electronic medical record.  These signatures attest to the fact that that the information above on your After Visit Summary has been reviewed and is understood.  Full responsibility of the confidentiality of this discharge information lies with you and/or your care-partner.

## 2020-12-05 NOTE — Op Note (Signed)
[facility] Patient Name: Rachel Mcintosh Procedure Date: 12/05/2020 10:32 AM MRN: [id number] Endoscopist: Gatha Mayer , MD Age: 65 Referring MD:  Date of Birth: [birth year] Gender: Female Account #: [id number] Procedure:                Upper GI endoscopy Indications:              Dysphagia, Globus sensation Medicines:                Propofol per Anesthesia, Monitored Anesthesia Care Procedure:                Pre-Anesthesia Assessment:                           - Prior to the procedure, a History and Physical                            was performed, and patient medications and                            allergies were reviewed. The patient's tolerance of                            previous anesthesia was also reviewed. The risks                            and benefits of the procedure and the sedation                            options and risks were discussed with the patient.                            All questions were answered, and informed consent                            was obtained. Prior Anticoagulants: The patient has                            taken no previous anticoagulant or antiplatelet                            agents. ASA Grade Assessment: III - A patient with                            severe systemic disease. After reviewing the risks                            and benefits, the patient was deemed in                            satisfactory condition to undergo the procedure.                           After obtaining informed consent, the endoscope was  passed under direct vision. Throughout the                            procedure, the patient's blood pressure, pulse, and                            oxygen saturations were monitored continuously. The                            Endoscope was introduced through the mouth, and                            advanced to the second part of duodenum. The upper                             GI endoscopy was accomplished without difficulty.                            The patient tolerated the procedure well. Scope In: Scope Out: Findings:                 A web was found in the proximal esophagus. The                            scope was withdrawn. Dilation was performed with a                            Maloney dilator with mild resistance at 75 Fr. The                            dilation site was examined and showed moderate                            mucosal disruption. Estimated blood loss was                            minimal.                           A mild Schatzki ring was found in the distal                            esophagus. The scope was withdrawn. Dilation was                            performed with a Maloney dilator with mild                            resistance at 64 Fr. The dilation site was examined                            and showed mild mucosal disruption. Estimated blood  loss was minimal.                           The gastroesophageal flap valve was visualized                            endoscopically and classified as Hill Grade II                            (fold present, opens with respiration).                           A 1 cm hiatal hernia was present.                           The exam was otherwise without abnormality.                           The cardia and gastric fundus were otherwise normal                            on retroflexion. Complications:            No immediate complications. Estimated Blood Loss:     Estimated blood loss was minimal. Impression:               - Web in the proximal esophagus. Dilated.                           - Mild Schatzki ring. Dilated.                           - Gastroesophageal flap valve classified as Hill                            Grade II (fold present, opens with respiration).                           - 1 cm hiatal hernia.                           - The examination  was otherwise normal.                           - No specimens collected. Recommendation:           - Patient has a contact number available for                            emergencies. The signs and symptoms of potential                            delayed complications were discussed with the                            patient. Return to normal activities tomorrow.  Written discharge instructions were provided to the                            patient.                           - Clear liquids x 1 hour then soft foods rest of                            day. Start prior diet tomorrow.                           - Continue present medications.                           - patient to message me in 1-2 weeks and update me                            Re: symptoms Gatha Mayer, MD 12/05/2020 10:56:48 AM This report has been signed electronically.

## 2020-12-05 NOTE — Progress Notes (Signed)
Called to room to assist during endoscopic procedure.  Patient ID and intended procedure confirmed with present staff. Received instructions for my participation in the procedure from the performing physician.  

## 2020-12-05 NOTE — Progress Notes (Signed)
Report to PACU, RN, vss, BBS= Clear.  

## 2020-12-05 NOTE — Progress Notes (Signed)
VS taken by Esperanza 

## 2020-12-06 ENCOUNTER — Other Ambulatory Visit: Payer: Self-pay | Admitting: Family Medicine

## 2020-12-06 NOTE — Telephone Encounter (Signed)
ERx 

## 2020-12-06 NOTE — Telephone Encounter (Signed)
Name of Medication: Alprazolam Name of Pharmacy: [facility] Last Fill or Written Date and Quantity: 10/09/20, #30 Last Office Visit and Type: 09/04/20, GERD Next Office Visit and Type: none Last Controlled Substance Agreement Date: 01/22/15 Last UDS: 01/22/15

## 2020-12-07 ENCOUNTER — Other Ambulatory Visit: Payer: Self-pay | Admitting: Family Medicine

## 2020-12-09 ENCOUNTER — Telehealth: Payer: Self-pay | Admitting: *Deleted

## 2020-12-09 ENCOUNTER — Telehealth: Payer: Self-pay

## 2020-12-09 NOTE — Telephone Encounter (Signed)
  Follow up Call-  Call back number 12/05/2020  Post procedure Call Back phone  # [phone]  Permission to leave phone message Yes  Some recent data might be hidden     Patient questions:  Do you have a fever, pain , or abdominal swelling? No. Pain Score  2   Have you tolerated food without any problems? Yes.    Have you been able to return to your normal activities? Yes.    Do you have any questions about your discharge instructions: Diet   No. Medications  No. Follow up visit  No.  Do you have questions or concerns about your Care? Yes.    Actions: * If pain score is 4 or above: No action needed, pain <4.  1. Have you developed a fever since your procedure? no  2.   Have you had an respiratory symptoms (SOB or cough) since your procedure? no  3.   Have you tested positive for COVID 19 since your procedure no  4.   Have you had any family members/close contacts diagnosed with the COVID 19 since your procedure?  no   If yes to any of these questions please route to Joylene John, RN and Joella Prince, RN

## 2020-12-09 NOTE — Telephone Encounter (Signed)
  Follow up Call-  Call back number 12/05/2020  Post procedure Call Back phone  # [phone]  Permission to leave phone message Yes  Some recent data might be hidden     Patient questions:  Do you have a fever, pain , or abdominal swelling? No. Pain Score  0 *  Have you tolerated food without any problems? Yes.    Have you been able to return to your normal activities? Yes.    Do you have any questions about your discharge instructions: Diet   No. Medications  No. Follow up visit  No.  Do you have questions or concerns about your Care? No.  Actions: * If pain score is 4 or above: No action needed, pain <4. 1. Have you developed a fever since your procedure? no  2.   Have you had an respiratory symptoms (SOB or cough) since your procedure? no  3.   Have you tested positive for COVID 19 since your procedure no  4.   Have you had any family members/close contacts diagnosed with the COVID 19 since your procedure?  no   If yes to any of these questions please route to Joylene John, RN and Joella Prince, RN

## 2021-01-13 ENCOUNTER — Other Ambulatory Visit: Payer: Self-pay | Admitting: Family Medicine

## 2021-01-13 NOTE — Telephone Encounter (Signed)
ERx 

## 2021-02-11 ENCOUNTER — Other Ambulatory Visit: Payer: Self-pay | Admitting: Family Medicine

## 2021-02-12 NOTE — Telephone Encounter (Signed)
Name of Medication: Alprazolam Name of Pharmacy: [facility] Last Fill or Written Date and Quantity: 01/13/21, #30 Last Office Visit and Type: 09/04/20, GERD Next Office Visit and Type: none Last Controlled Substance Agreement Date: 01/22/15 Last UDS: 01/22/15

## 2021-02-13 NOTE — Telephone Encounter (Signed)
ERx 

## 2021-03-07 ENCOUNTER — Encounter: Payer: Self-pay | Admitting: Family Medicine

## 2021-03-07 ENCOUNTER — Telehealth (INDEPENDENT_AMBULATORY_CARE_PROVIDER_SITE_OTHER): Payer: Medicare HMO | Admitting: Family Medicine

## 2021-03-07 ENCOUNTER — Other Ambulatory Visit: Payer: Self-pay | Admitting: Family Medicine

## 2021-03-07 DIAGNOSIS — J069 Acute upper respiratory infection, unspecified: Secondary | ICD-10-CM

## 2021-03-07 DIAGNOSIS — Z1231 Encounter for screening mammogram for malignant neoplasm of breast: Secondary | ICD-10-CM

## 2021-03-07 MED ORDER — AZITHROMYCIN 250 MG PO TABS: ORAL_TABLET | ORAL | 0 refills | Status: AC

## 2021-03-07 NOTE — Progress Notes (Signed)
Virtual visit completed through WebEx or similar program Patient location: home  Provider location: [place] at [facility], office  Participants: Patient and me (unless stated otherwise below)  Pandemic considerations d/w pt.   Limitations and rationale for visit method d/w patient.  Patient agreed to proceed.   CC: URI sx.    HPI: sx started about 1 week ago.  Frontal HA. Pressure in the face.  Gradually sx got worse.  She has a h/o jittery feeling in her chest and "I always get it when I get a sinus infection."  Not SOB.  No ear pain.  No ST.  Minimal cough, once or twice a day.  Baseline rare use SABA, no recent use.  She feels better today.  No rash.  No known COVID exposure, no recent test.  She is >5 days into sx.  No loss of taste or smell.  She is still using flonase, recently. No wheeze.    Meds and allergies reviewed.   ROS: Per HPI unless specifically indicated in ROS section   NAD Speech wnl  A/P:  URI sx.  D/w pt about option.  Nontoxic.  Routine cautions given to patient Hold abx for now.  Rest and fluids, continue flonase for now.  She can use SABA prn.

## 2021-03-09 DIAGNOSIS — J069 Acute upper respiratory infection, unspecified: Secondary | ICD-10-CM | POA: Insufficient documentation

## 2021-03-09 NOTE — Assessment & Plan Note (Signed)
URI sx.  D/w pt about option.  Nontoxic.  Routine cautions given to patient Hold abx for now.  Rest and fluids, continue flonase for now.  She can use SABA prn.

## 2021-03-13 ENCOUNTER — Other Ambulatory Visit: Payer: Self-pay | Admitting: Family Medicine

## 2021-03-14 NOTE — Telephone Encounter (Signed)
Last office visit 03/07/2021 (video) for URI with Dr. Damita Dunnings.  Last refilled 02/13/21 for #30 with no refills.  CPE scheduled for 06/09/21.

## 2021-03-17 NOTE — Telephone Encounter (Signed)
ERx 

## 2021-03-17 NOTE — Telephone Encounter (Signed)
Pt called to follow up on refill request, she is out of medication

## 2021-04-14 ENCOUNTER — Other Ambulatory Visit: Payer: Self-pay | Admitting: Family Medicine

## 2021-04-14 NOTE — Telephone Encounter (Signed)
Last office visit 03/07/2021 (video) for URI with Dr. Damita Dunnings.  Last refilled 07/18//22 for #30 with no refills.    CPE scheduled for 06/09/21.

## 2021-04-16 NOTE — Telephone Encounter (Signed)
ERx 

## 2021-05-28 ENCOUNTER — Telehealth: Payer: Self-pay

## 2021-05-28 ENCOUNTER — Telehealth (INDEPENDENT_AMBULATORY_CARE_PROVIDER_SITE_OTHER): Payer: Medicare HMO | Admitting: Nurse Practitioner

## 2021-05-28 ENCOUNTER — Other Ambulatory Visit: Payer: Self-pay

## 2021-05-28 DIAGNOSIS — J069 Acute upper respiratory infection, unspecified: Secondary | ICD-10-CM

## 2021-05-28 MED ORDER — ALBUTEROL SULFATE HFA 108 (90 BASE) MCG/ACT IN AERS: 2.0000 | INHALATION_SPRAY | Freq: Four times a day (QID) | RESPIRATORY_TRACT | 3 refills | Status: DC | PRN

## 2021-05-28 MED ORDER — LEVOCETIRIZINE DIHYDROCHLORIDE 5 MG PO TABS: 5.0000 mg | ORAL_TABLET | Freq: Every evening | ORAL | 0 refills | Status: DC

## 2021-05-28 NOTE — Assessment & Plan Note (Signed)
Discussed with patient's the importance of getting COVID tested.  She states that she lives with her mother who has dementia she is unable to leave her.  Did recommend at home testing.  Patient is not vaccinated against COVID-19.  Symptoms started yesterday.  Will treat symptomatically currently she is requesting a refill of her albuterol inhaler we will supply.  We will still recommend Sudafed but that she had chart diagnosis of pulmonary hypertension told her to avoid such medications.  Trial levocetirizine and adjunct with Flonase fluid and Tylenol.  Continue to monitor recommend COVID testing.  Offered at office COVID testing.  Patient declined

## 2021-05-28 NOTE — Telephone Encounter (Signed)
Pt already has video visit scheduled 05/28/21 at 9:40. Sending note to Romilda Garret NP.

## 2021-05-28 NOTE — Progress Notes (Signed)
Patient ID: Rachel Mcintosh, female    DOB: 17-Jul-1956, 65 y.o.   MRN: [id number]  Virtual visit completed through [place], a video enabled telemedicine application. Due to national recommendations of social distancing due to COVID-19, a virtual visit is felt to be most appropriate for this patient at this time. Reviewed limitations, risks, security and privacy concerns of performing a virtual visit and the availability of in person appointments. I also reviewed that there may be a patient responsible charge related to this service. The patient agreed to proceed.   Patient location: home Provider location: [place] at [facility], office Persons participating in this virtual visit: patient, provider   If any vitals were documented, they were collected by patient at home unless specified below.    There were no vitals taken for this visit.   CC: Headache, cough Subjective:   HPI: Rachel Mcintosh is a 65 y.o. female presenting on 05/28/2021 for Cough (Headache right between the eyes, dry cough, x 2 days, no other symptoms.)  Symptoms started yesterday. Has not been vaccinated against covid. Has not tested for covid recently No sick contacts. Has been taking tylenol. Has started taking flonase also.   Relevant past medical, surgical, family and social history reviewed and updated as indicated. Interim medical history since our last visit reviewed. Allergies and medications reviewed and updated. Outpatient Medications Prior to Visit  Medication Sig Dispense Refill   albuterol (VENTOLIN HFA) 108 (90 Base) MCG/ACT inhaler Inhale 2 puffs into the lungs every 6 (six) hours as needed for wheezing or shortness of breath. 18 g 3   ALPRAZolam (XANAX) 1 MG tablet TAKE 1 TABLET BY MOUTH TWICE A DAY AS NEEDED FOR ANXIETY 30 tablet 1   Calcium Carbonate-Vitamin D3 (CALCIUM 600/VITAMIN D) 600-400 MG-UNIT TABS Take 1 tablet by mouth daily.     famotidine (PEPCID) 20 MG tablet Take 20 mg by  mouth daily as needed for heartburn or indigestion.     fluticasone (FLONASE) 50 MCG/ACT nasal spray Place 2 sprays into both nostrils daily. 16 g 6   Naproxen Sodium (ALEVE PO) Take 1 tablet by mouth as needed.     vitamin B-12 (V-R VITAMIN B-12) 500 MCG tablet Take 1 tablet (500 mcg total) by mouth 3 (three) times a week.     No facility-administered medications prior to visit.     Per HPI unless specifically indicated in ROS section below Review of Systems  Constitutional:  Negative for chills, fatigue and fever.  HENT:  Positive for sinus pressure. Negative for rhinorrhea and sore throat.   Respiratory:  Positive for cough. Negative for shortness of breath.   Cardiovascular:  Negative for chest pain.  Gastrointestinal:  Negative for diarrhea, nausea and vomiting.  Neurological:  Positive for headaches.  Objective:  There were no vitals taken for this visit.  Wt Readings from Last 3 Encounters:  12/05/20 144 lb (65.3 kg)  11/13/20 143 lb 8 oz (65.1 kg)  10/25/20 143 lb 2 oz (64.9 kg)      Physical exam: Gen: alert, NAD, not ill appearing Pulm: speaks in complete sentences without increased work of breathing Psych: normal mood, normal thought content      Results for orders placed or performed in visit on 11/13/20  WET PREP BY MOLECULAR PROBE   Specimen: Vaginal Fluid  Result Value Ref Range   MICRO NUMBER: [id number]    SPECIMEN QUALITY: Adequate    SOURCE: NOT GIVEN    STATUS: FINAL  Trichomonas vaginosis Not Detected    Gardnerella vaginalis Not Detected    Candida species Not Detected   POCT Urinalysis Dipstick (Automated)  Result Value Ref Range   Color, UA Yellow    Clarity, UA Clear    Glucose, UA Negative Negative   Bilirubin, UA Negative    Ketones, UA Negative    Spec Grav, UA 1.015 1.010 - 1.025   Blood, UA Trace    pH, UA 7.0 5.0 - 8.0   Protein, UA Negative Negative   Urobilinogen, UA 0.2 0.2 or 1.0 E.U./dL   Nitrite, UA Negative    Leukocytes, UA  Negative Negative   Assessment & Plan:   Problem List Items Addressed This Visit       Respiratory   Viral URI    Discussed with patient's the importance of getting COVID tested.  She states that she lives with her mother who has dementia she is unable to leave her.  Did recommend at home testing.  Patient is not vaccinated against COVID-19.  Symptoms started yesterday.  Will treat symptomatically currently she is requesting a refill of her albuterol inhaler we will supply.  We will still recommend Sudafed but that she had chart diagnosis of pulmonary hypertension told her to avoid such medications.  Trial levocetirizine and adjunct with Flonase fluid and Tylenol.  Continue to monitor recommend COVID testing.  Offered at office COVID testing.  Patient declined      Relevant Medications   albuterol (VENTOLIN HFA) 108 (90 Base) MCG/ACT inhaler   levocetirizine (XYZAL) 5 MG tablet     No orders of the defined types were placed in this encounter.  No orders of the defined types were placed in this encounter.   I discussed the assessment and treatment plan with the patient. The patient was provided an opportunity to ask questions and all were answered. The patient agreed with the plan and demonstrated an understanding of the instructions. The patient was advised to call back or seek an in-person evaluation if the symptoms worsen or if the condition fails to improve as anticipated.  Follow up plan: No follow-ups on file.   Rachel Garret, NP

## 2021-05-28 NOTE — Telephone Encounter (Signed)
[facility] - Client TELEPHONE ADVICE RECORD [facility] Patient Name: Rachel Mcintosh Gender: Female DOB: [birth year] Age: 65 Y 10 M 21 D Return Phone Number: [phone] (Primary) Address: City/ State/ Zip: [place] [address] Client [facility] - Client Client Site [facility] - Day Physician Ria Bush - MD Contact Type Call Who Is Calling Patient / Member / Family / Caregiver Call Type Triage / Clinical Relationship To Patient Self Return Phone Number [phone] (Primary) Chief Complaint Headache Reason for Call Symptomatic / Request for Health Information Initial Comment Call sent from office. Caller was requesting a virtual appt. She states she has a headache between eyes since yesterday, sinus pressure, tired and a cough. No fever. Translation No Nurse Assessment Nurse: Jimmye Norman, RN, Whitney Date/Time (Eastern Time): 05/28/2021 8:39:04 AM Confirm and document reason for call. If symptomatic, describe symptoms. ---Call sent from office. Caller was requesting a virtual appointment. She states she has a headache between eyes since yesterday, sinus pressure, feeling tired and has a mild cough. Denies fever. Does the patient have any new or worsening symptoms? ---Yes Will a triage be completed? ---Yes Related visit to physician within the last 2 weeks? ---No Does the PT have any chronic conditions? (i.e. diabetes, asthma, this includes High risk factors for pregnancy, etc.) ---Yes List chronic conditions. ---COPD Is this a behavioral health or substance abuse call? ---No Guidelines Guideline Title Affirmed Question Affirmed Notes Nurse Date/Time ([place] Time) COVID-19 - Diagnosed or Suspected [1] HIGH RISK for severe COVID complications (e.g., weak immune system, age > 4 years, obesity with BMI 30 or higher, pregnant, chronic Jimmye Norman, RN, Whitney 05/28/2021  8:40:55 AM PLEASE NOTE: All timestamps contained within this report are represented as [place] Standard Time. CONFIDENTIALTY NOTICE: This fax transmission is intended only for the addressee. It contains information that is legally privileged, confidential or otherwise protected from use or disclosure. If you are not the intended recipient, you are strictly prohibited from reviewing, disclosing, copying using or disseminating any of this information or taking any action in reliance on or regarding this information. If you have received this fax in error, please notify us immediately by telephone so that we can arrange for its return to [place]. Phone: [phone], Toll-Free: [phone], Fax: [phone] Page: 2 of 2 Call Id: [id number] Guidelines Guideline Title Affirmed Question Affirmed Notes Nurse Date/Time Eilene Ghazi Time) lung disease or other chronic medical condition) AND [2] COVID symptoms (e.g., cough, fever) (Exceptions: Already seen by PCP and no new or worsening symptoms.) Disp. Time Eilene Ghazi Time) Disposition Final User 05/28/2021 8:48:22 AM Call PCP within 24 Hours Yes Jimmye Norman, RN, Loree Fee Caller Disagree/Comply Comply Caller Understands Yes PreDisposition InappropriateToAsk Care Advice Given Per Guideline ALTERNATE DISPOSITION - TELEMEDICINE WITHIN 24 HOURS: * Telemedicine may be your best choice for care during this COVID-19 outbreak. GENERAL CARE ADVICE FOR COVID-19 SYMPTOMS: * The symptoms are generally treated the same whether you have COVID-19, influenza or some other respiratory virus. * Cough: Use cough drops. * Feeling dehydrated: Drink extra liquids. If the air in your home is dry, use a humidifier. COUGH MEDICINES: * COUGH DROPS: Over-the-counter cough drops can help a lot, especially for mild coughs. They soothe an irritated throat and remove the tickle sensation in the back of the throat. Cough drops are easy to carry with you. * COUGH SYRUP WITH  DEXTROMETHORPHAN: An over-the-counter cough syrup can help your cough. The most common cough suppressant in over-the-counter cough medicines  is dextromethorphan. Referrals REFERRED TO PCP OFFICE

## 2021-06-01 ENCOUNTER — Other Ambulatory Visit: Payer: Self-pay | Admitting: Family Medicine

## 2021-06-01 DIAGNOSIS — E538 Deficiency of other specified B group vitamins: Secondary | ICD-10-CM

## 2021-06-01 DIAGNOSIS — E7800 Pure hypercholesterolemia, unspecified: Secondary | ICD-10-CM

## 2021-06-01 DIAGNOSIS — R0609 Other forms of dyspnea: Secondary | ICD-10-CM

## 2021-06-03 ENCOUNTER — Other Ambulatory Visit: Payer: Medicare HMO

## 2021-06-04 ENCOUNTER — Encounter: Payer: Self-pay | Admitting: Primary Care

## 2021-06-04 ENCOUNTER — Telehealth (INDEPENDENT_AMBULATORY_CARE_PROVIDER_SITE_OTHER): Payer: Medicare HMO | Admitting: Primary Care

## 2021-06-04 VITALS — Ht 66.0 in | Wt 144.0 lb

## 2021-06-04 DIAGNOSIS — R051 Acute cough: Secondary | ICD-10-CM | POA: Diagnosis not present

## 2021-06-04 MED ORDER — PREDNISONE 20 MG PO TABS: ORAL_TABLET | ORAL | 0 refills | Status: DC

## 2021-06-04 MED ORDER — AZITHROMYCIN 250 MG PO TABS: ORAL_TABLET | ORAL | 0 refills | Status: DC

## 2021-06-04 NOTE — Progress Notes (Signed)
Patient ID: Rachel Mcintosh, female    DOB: 1955-09-02, 65 y.o.   MRN: [id number]  Virtual visit completed through [place], a video enabled telemedicine application. Due to national recommendations of social distancing due to COVID-19, a virtual visit is felt to be most appropriate for this patient at this time. Reviewed limitations, risks, security and privacy concerns of performing a virtual visit and the availability of in person appointments. I also reviewed that there may be a patient responsible charge related to this service. The patient agreed to proceed.   Patient location: home Provider location: [place] at [facility], office Persons participating in this virtual visit: patient, provider   If any vitals were documented, they were collected by patient at home unless specified below.    Ht 5\' 6"  (1.676 m)   Wt 144 lb (65.3 kg)   BMI 23.24 kg/m    CC: Headache and Dry Cough Subjective:   HPI: Rachel Mcintosh is a 65 y.o. female patient of Dr. Danise Mina with a history of pulmonary hypertension, COPD, acute bronchitis, allergic rhinitis, tobacco abuse presenting on 06/04/2021 to discuss cough.   Evaluated by Romilda Garret, NP on 05/28/21 for a 2 day history of dry cough and headache. Diagnosed with viral URI, also encouraged patient get Covid-19 tested, patient is not vaccinated against Covid-19. Treated with Flonase, Xyzal, Tylenol, albuterol inhaler.  Since her last visit she continues to feel poorly. She endorses a decline in appetite, lower energy level, increased intermittent cough, increased sputum production (yellow last week, now clear), intermittent shortness of breath. She's been active and doing her typical chores which have been harder.   She is a prior smoker, quit in 2016. She had a temperature of 103.1 one week ago, no fevers since. She has not tested for Covid-19.   She denies loss of taste/smell, diarrhea.       Relevant past medical, surgical, family  and social history reviewed and updated as indicated. Interim medical history since our last visit reviewed. Allergies and medications reviewed and updated. Outpatient Medications Prior to Visit  Medication Sig Dispense Refill   albuterol (VENTOLIN HFA) 108 (90 Base) MCG/ACT inhaler Inhale 2 puffs into the lungs every 6 (six) hours as needed for wheezing or shortness of breath. 18 g 3   ALPRAZolam (XANAX) 1 MG tablet TAKE 1 TABLET BY MOUTH TWICE A DAY AS NEEDED FOR ANXIETY 30 tablet 1   Calcium Carbonate-Vitamin D3 (CALCIUM 600/VITAMIN D) 600-400 MG-UNIT TABS Take 1 tablet by mouth daily.     fluticasone (FLONASE) 50 MCG/ACT nasal spray Place 2 sprays into both nostrils daily. 16 g 6   Naproxen Sodium (ALEVE PO) Take 1 tablet by mouth as needed.     vitamin B-12 (V-R VITAMIN B-12) 500 MCG tablet Take 1 tablet (500 mcg total) by mouth 3 (three) times a week.     famotidine (PEPCID) 20 MG tablet Take 20 mg by mouth daily as needed for heartburn or indigestion. (Patient not taking: Reported on 06/04/2021)     levocetirizine (XYZAL) 5 MG tablet Take 1 tablet (5 mg total) by mouth every evening. (Patient not taking: Reported on 06/04/2021) 14 tablet 0   No facility-administered medications prior to visit.     Per HPI unless specifically indicated in ROS section below Review of Systems  Constitutional:  Positive for appetite change and fatigue. Negative for fever.  HENT:  Positive for congestion.   Respiratory:  Positive for cough and shortness of breath.  Gastrointestinal:  Negative for abdominal pain, diarrhea and nausea.  Objective:  Ht 5\' 6"  (1.676 m)   Wt 144 lb (65.3 kg)   BMI 23.24 kg/m   Wt Readings from Last 3 Encounters:  06/04/21 144 lb (65.3 kg)  12/05/20 144 lb (65.3 kg)  11/13/20 143 lb 8 oz (65.1 kg)       Physical exam: Gen: alert, NAD, appears tired Pulm: speaks in complete sentences without increased work of breathing, no cough during visit. Psych: normal mood, normal  thought content      Results for orders placed or performed in visit on 11/13/20  WET PREP BY MOLECULAR PROBE   Specimen: Vaginal Fluid  Result Value Ref Range   MICRO NUMBER: [id number]    SPECIMEN QUALITY: Adequate    SOURCE: NOT GIVEN    STATUS: FINAL    Trichomonas vaginosis Not Detected    Gardnerella vaginalis Not Detected    Candida species Not Detected   POCT Urinalysis Dipstick (Automated)  Result Value Ref Range   Color, UA Yellow    Clarity, UA Clear    Glucose, UA Negative Negative   Bilirubin, UA Negative    Ketones, UA Negative    Spec Grav, UA 1.015 1.010 - 1.025   Blood, UA Trace    pH, UA 7.0 5.0 - 8.0   Protein, UA Negative Negative   Urobilinogen, UA 0.2 0.2 or 1.0 E.U./dL   Nitrite, UA Negative    Leukocytes, UA Negative Negative   Assessment & Plan:   Problem List Items Addressed This Visit       Other   Acute cough - Primary    Symptoms appear to be improving, however she is not feeling better.  Symptoms could very well have been viral, but given her medical history (including smoking history) coupled with dyspnea/cough/congesiton, will treat for COPD exacerbation.  She has several allergies to antibiotics, does well with azithromycin. Rx for Zpack and prednisone burst sent to pharmacy. Continue PRN albuterol.   She will update in 2-3 days if no improvement. She is stable for outpatient treatment.       Relevant Medications   azithromycin (ZITHROMAX) 250 MG tablet   predniSONE (DELTASONE) 20 MG tablet     Meds ordered this encounter  Medications   azithromycin (ZITHROMAX) 250 MG tablet    Sig: Take 2 tablets by mouth today, then 1 tablet daily for 4 additional days.    Dispense:  6 tablet    Refill:  0    Order Specific Question:   Supervising Provider    Answer:   BEDSOLE, AMY E [[id number]]   predniSONE (DELTASONE) 20 MG tablet    Sig: Take 2 tablets by mouth once daily for 5 days.    Dispense:  10 tablet    Refill:  0    Order Specific  Question:   Supervising Provider    Answer:   BEDSOLE, AMY E [[id number]]   No orders of the defined types were placed in this encounter.   I discussed the assessment and treatment plan with the patient. The patient was provided an opportunity to ask questions and all were answered. The patient agreed with the plan and demonstrated an understanding of the instructions. The patient was advised to call back or seek an in-person evaluation if the symptoms worsen or if the condition fails to improve as anticipated.  Follow up plan:  Start Azithromycin antibiotics for infection. Take 2 tablets by mouth today, then 1 tablet  daily for 4 additional days.  Start prednisone 20 mg tablets. Take 2 tablets by mouth once daily for 5 days.  Please update me if no improvement within 2-3 days.  It was a pleasure to see you today!   Pleas Koch, NP

## 2021-06-04 NOTE — Patient Instructions (Signed)
Start Azithromycin antibiotics for infection. Take 2 tablets by mouth today, then 1 tablet daily for 4 additional days.  Start prednisone 20 mg tablets. Take 2 tablets by mouth once daily for 5 days.  Please update me if no improvement within 2-3 days.  It was a pleasure to see you today!

## 2021-06-04 NOTE — Assessment & Plan Note (Signed)
Symptoms appear to be improving, however she is not feeling better.  Symptoms could very well have been viral, but given her medical history (including smoking history) coupled with dyspnea/cough/congesiton, will treat for COPD exacerbation.  She has several allergies to antibiotics, does well with azithromycin. Rx for Zpack and prednisone burst sent to pharmacy. Continue PRN albuterol.   She will update in 2-3 days if no improvement. She is stable for outpatient treatment.

## 2021-06-09 ENCOUNTER — Ambulatory Visit
Admission: RE | Admit: 2021-06-09 | Discharge: 2021-06-09 | Disposition: A | Discharge status: Home / Self Care | Payer: Medicare HMO | Source: Ambulatory Visit | Attending: Family Medicine | Admitting: Family Medicine

## 2021-06-09 ENCOUNTER — Encounter: Payer: Self-pay | Admitting: Family Medicine

## 2021-06-09 ENCOUNTER — Ambulatory Visit (INDEPENDENT_AMBULATORY_CARE_PROVIDER_SITE_OTHER): Payer: Medicare HMO | Admitting: Family Medicine

## 2021-06-09 ENCOUNTER — Other Ambulatory Visit: Payer: Self-pay

## 2021-06-09 VITALS — BP 138/78 | HR 63 | Temp 97.8°F | Ht 66.0 in | Wt 149.2 lb

## 2021-06-09 DIAGNOSIS — S78112A Complete traumatic amputation at level between left hip and knee, initial encounter: Secondary | ICD-10-CM

## 2021-06-09 DIAGNOSIS — J439 Emphysema, unspecified: Secondary | ICD-10-CM

## 2021-06-09 DIAGNOSIS — Z0000 Encounter for general adult medical examination without abnormal findings: Secondary | ICD-10-CM

## 2021-06-09 DIAGNOSIS — E538 Deficiency of other specified B group vitamins: Secondary | ICD-10-CM | POA: Diagnosis not present

## 2021-06-09 DIAGNOSIS — R32 Unspecified urinary incontinence: Secondary | ICD-10-CM

## 2021-06-09 DIAGNOSIS — Z636 Dependent relative needing care at home: Secondary | ICD-10-CM

## 2021-06-09 DIAGNOSIS — M8580 Other specified disorders of bone density and structure, unspecified site: Secondary | ICD-10-CM

## 2021-06-09 DIAGNOSIS — E7800 Pure hypercholesterolemia, unspecified: Secondary | ICD-10-CM | POA: Diagnosis not present

## 2021-06-09 DIAGNOSIS — I700 Atherosclerosis of aorta: Secondary | ICD-10-CM

## 2021-06-09 DIAGNOSIS — L659 Nonscarring hair loss, unspecified: Secondary | ICD-10-CM | POA: Diagnosis not present

## 2021-06-09 DIAGNOSIS — Z23 Encounter for immunization: Secondary | ICD-10-CM | POA: Diagnosis not present

## 2021-06-09 DIAGNOSIS — Z87891 Personal history of nicotine dependence: Secondary | ICD-10-CM

## 2021-06-09 DIAGNOSIS — R0609 Other forms of dyspnea: Secondary | ICD-10-CM | POA: Diagnosis not present

## 2021-06-09 DIAGNOSIS — Z1231 Encounter for screening mammogram for malignant neoplasm of breast: Secondary | ICD-10-CM | POA: Diagnosis not present

## 2021-06-09 DIAGNOSIS — F4323 Adjustment disorder with mixed anxiety and depressed mood: Secondary | ICD-10-CM

## 2021-06-09 DIAGNOSIS — Z95828 Presence of other vascular implants and grafts: Secondary | ICD-10-CM

## 2021-06-09 LAB — CBC WITH DIFFERENTIAL/PLATELET
Basophils Absolute: 0 10*3/uL (ref 0.0–0.1)
Basophils Relative: 0.4 % (ref 0.0–3.0)
Eosinophils Absolute: 0 10*3/uL (ref 0.0–0.7)
Eosinophils Relative: 0.2 % (ref 0.0–5.0)
HCT: 40.7 % (ref 36.0–46.0)
Hemoglobin: 13.7 g/dL (ref 12.0–15.0)
Lymphocytes Relative: 27.2 % (ref 12.0–46.0)
Lymphs Abs: 2.1 10*3/uL (ref 0.7–4.0)
MCHC: 33.5 g/dL (ref 30.0–36.0)
MCV: 83.5 fl (ref 78.0–100.0)
Monocytes Absolute: 0.8 10*3/uL (ref 0.1–1.0)
Monocytes Relative: 10.4 % (ref 3.0–12.0)
Neutro Abs: 4.8 10*3/uL (ref 1.4–7.7)
Neutrophils Relative %: 61.8 % (ref 43.0–77.0)
Platelets: 398 10*3/uL (ref 150.0–400.0)
RBC: 4.88 Mil/uL (ref 3.87–5.11)
RDW: 13.2 % (ref 11.5–15.5)
WBC: 7.8 10*3/uL (ref 4.0–10.5)

## 2021-06-09 LAB — COMPREHENSIVE METABOLIC PANEL
ALT: 25 U/L (ref 0–35)
AST: 16 U/L (ref 0–37)
Albumin: 4.1 g/dL (ref 3.5–5.2)
Alkaline Phosphatase: 53 U/L (ref 39–117)
BUN: 15 mg/dL (ref 6–23)
CO2: 29 mEq/L (ref 19–32)
Calcium: 9.3 mg/dL (ref 8.4–10.5)
Chloride: 104 mEq/L (ref 96–112)
Creatinine, Ser: 0.62 mg/dL (ref 0.40–1.20)
GFR: 95.11 mL/min (ref >60.00)
Glucose, Bld: 87 mg/dL (ref 70–99)
Potassium: 3.7 mEq/L (ref 3.5–5.1)
Sodium: 141 mEq/L (ref 135–145)
Total Bilirubin: 0.6 mg/dL (ref 0.2–1.2)
Total Protein: 6.5 g/dL (ref 6.0–8.3)

## 2021-06-09 LAB — LIPID PANEL
Cholesterol: 199 mg/dL (ref 0–200)
HDL: 60 mg/dL (ref >39.00)
LDL Cholesterol: 104 mg/dL — ABNORMAL HIGH (ref 0–99)
NonHDL: 138.71
Total CHOL/HDL Ratio: 3
Triglycerides: 172 mg/dL — ABNORMAL HIGH (ref 0.0–149.0)
VLDL: 34.4 mg/dL (ref 0.0–40.0)

## 2021-06-09 LAB — VITAMIN B12: Vitamin B-12: 896 pg/mL (ref 211–911)

## 2021-06-09 LAB — TSH: TSH: 0.59 u[IU]/mL (ref 0.35–5.50)

## 2021-06-09 MED ORDER — ALPRAZOLAM 1 MG PO TABS: ORAL_TABLET | ORAL | 1 refills | Status: DC

## 2021-06-09 MED ORDER — ESCITALOPRAM OXALATE 5 MG PO TABS: 5.0000 mg | ORAL_TABLET | Freq: Every day | ORAL | 11 refills | Status: DC

## 2021-06-09 NOTE — Assessment & Plan Note (Addendum)
Incidental finding on previous imaging study Declines DEXA at this time. Consider next year.

## 2021-06-09 NOTE — Assessment & Plan Note (Signed)
Describes predominant stress incontinence - discussed kegel exercises.

## 2021-06-09 NOTE — Assessment & Plan Note (Signed)
Continue b12 548mcg MWF

## 2021-06-09 NOTE — Assessment & Plan Note (Signed)
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
## 2021-06-09 NOTE — Assessment & Plan Note (Signed)
Preventative protocols reviewed and updated unless pt declined. Discussed healthy diet and lifestyle.  

## 2021-06-09 NOTE — Assessment & Plan Note (Signed)
Situational depression/anxiety over caregiver stressors caring for mother with dementia. Continue PRN xanax, she is interested in daily medication trial - will also start lexapro 5mg  daily, update if any trouble with this regimen.
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
## 2021-06-09 NOTE — Assessment & Plan Note (Signed)
Anticipate telogen effluvium from recent stressors. discussed this. Will check TSH

## 2021-06-09 NOTE — Assessment & Plan Note (Signed)
Chronic, off medication. Update lipid levels. The 10-year ASCVD risk score (Arnett DK, et al., 2019) is: 4.7%   Values used to calculate the score:     Age: 65 years     Sex: Female     Is Non-Hispanic African American: No     Diabetic: No     Tobacco smoker: No     Systolic Blood Pressure: 169 mmHg     Is BP treated: No     HDL Cholesterol: 73.7 mg/dL     Total Cholesterol: 256 mg/dL

## 2021-06-09 NOTE — Assessment & Plan Note (Signed)
Ex smoker since 2016.  Eligible for lung cancer screening program, desires to continue deferring for now.

## 2021-06-09 NOTE — Assessment & Plan Note (Signed)
Significant, see above, on PRN xanax.  Trazodone, amitriptyline trial previousyl didn't help.  Will trial lexapro, continue xanax.

## 2021-06-09 NOTE — Assessment & Plan Note (Addendum)
Largely asymptomatic. Incidental finding on imaging.

## 2021-06-09 NOTE — Assessment & Plan Note (Signed)
Consider starting statin pending FLP.

## 2021-06-09 NOTE — Patient Instructions (Addendum)
Flu shot today Labs today  Let's try lexapro 5mg  daily. Keep me updated with how you do with this.  If interested, check with pharmacy about new 2 shot shingles series (shingrix).  Good to see you today, return as needed or in 3-4 months for follow up visit.   Health Maintenance for Postmenopausal Women Menopause is a normal process in which your ability to get pregnant comes to an end. This process happens slowly over many months or years, usually between the ages of 66 and 60. Menopause is complete when you have missed your menstrual periods for 12 months. It is important to talk with your health care provider about some of the most common conditions that affect women after menopause (postmenopausal women). These include heart disease, cancer, and bone loss (osteoporosis). Adopting a healthy lifestyle and getting preventive care can help to promote your health and wellness. The actions you take can also lower your chances of developing some of these common conditions. What should I know about menopause? During menopause, you may get a number of symptoms, such as: Hot flashes. These can be moderate or severe. Night sweats. Decrease in sex drive. Mood swings. Headaches. Tiredness. Irritability. Memory problems. Insomnia. Choosing to treat or not to treat these symptoms is a decision that you make with your health care provider. Do I need hormone replacement therapy? Hormone replacement therapy is effective in treating symptoms that are caused by menopause, such as hot flashes and night sweats. Hormone replacement carries certain risks, especially as you become older. If you are thinking about using estrogen or estrogen with progestin, discuss the benefits and risks with your health care provider. What is my risk for heart disease and stroke? The risk of heart disease, heart attack, and stroke increases as you age. One of the causes may be a change in the body's hormones during menopause.  This can affect how your body uses dietary fats, triglycerides, and cholesterol. Heart attack and stroke are medical emergencies. There are many things that you can do to help prevent heart disease and stroke. Watch your blood pressure High blood pressure causes heart disease and increases the risk of stroke. This is more likely to develop in people who have high blood pressure readings, are of African descent, or are overweight. Have your blood pressure checked: Every 3-5 years if you are 48-56 years of age. Every year if you are 60 years old or older. Eat a healthy diet  Eat a diet that includes plenty of vegetables, fruits, low-fat dairy products, and lean protein. Do not eat a lot of foods that are high in solid fats, added sugars, or sodium. Get regular exercise Get regular exercise. This is one of the most important things you can do for your health. Most adults should: Try to exercise for at least 150 minutes each week. The exercise should increase your heart rate and make you sweat (moderate-intensity exercise). Try to do strengthening exercises at least twice each week. Do these in addition to the moderate-intensity exercise. Spend less time sitting. Even light physical activity can be beneficial. Other tips Work with your health care provider to achieve or maintain a healthy weight. Do not use any products that contain nicotine or tobacco, such as cigarettes, e-cigarettes, and chewing tobacco. If you need help quitting, ask your health care provider. Know your numbers. Ask your health care provider to check your cholesterol and your blood sugar (glucose). Continue to have your blood tested as directed by your health care  provider. Do I need screening for cancer? Depending on your health history and family history, you may need to have cancer screening at different stages of your life. This may include screening for: Breast cancer. Cervical cancer. Lung cancer. Colorectal  cancer. What is my risk for osteoporosis? After menopause, you may be at increased risk for osteoporosis. Osteoporosis is a condition in which bone destruction happens more quickly than new bone creation. To help prevent osteoporosis or the bone fractures that can happen because of osteoporosis, you may take the following actions: If you are 66-42 years old, get at least 1,000 mg of calcium and at least 600 mg of vitamin D per day. If you are older than age 75 but younger than age 75, get at least 1,200 mg of calcium and at least 600 mg of vitamin D per day. If you are older than age 57, get at least 1,200 mg of calcium and at least 800 mg of vitamin D per day. Smoking and drinking excessive alcohol increase the risk of osteoporosis. Eat foods that are rich in calcium and vitamin D, and do weight-bearing exercises several times each week as directed by your health care provider. How does menopause affect my mental health? Depression may occur at any age, but it is more common as you become older. Common symptoms of depression include: Low or sad mood. Changes in sleep patterns. Changes in appetite or eating patterns. Feeling an overall lack of motivation or enjoyment of activities that you previously enjoyed. Frequent crying spells. Talk with your health care provider if you think that you are experiencing depression. General instructions See your health care provider for regular wellness exams and vaccines. This may include: Scheduling regular health, dental, and eye exams. Getting and maintaining your vaccines. These include: Influenza vaccine. Get this vaccine each year before the flu season begins. Pneumonia vaccine. Shingles vaccine. Tetanus, diphtheria, and pertussis (Tdap) booster vaccine. Your health care provider may also recommend other immunizations. Tell your health care provider if you have ever been abused or do not feel safe at home. Summary Menopause is a normal process in  which your ability to get pregnant comes to an end. This condition causes hot flashes, night sweats, decreased interest in sex, mood swings, headaches, or lack of sleep. Treatment for this condition may include hormone replacement therapy. Take actions to keep yourself healthy, including exercising regularly, eating a healthy diet, watching your weight, and checking your blood pressure and blood sugar levels. Get screened for cancer and depression. Make sure that you are up to date with all your vaccines. This information is not intended to replace advice given to you by your health care provider. Make sure you discuss any questions you have with your health care provider. Document Revised: 08/10/2018 Document Reviewed: 08/10/2018 Elsevier Patient Education  2022 [employer].

## 2021-07-01 ENCOUNTER — Other Ambulatory Visit: Payer: Self-pay | Admitting: Family Medicine

## 2021-07-06 ENCOUNTER — Encounter: Payer: Self-pay | Admitting: Family Medicine

## 2021-07-06 MED ORDER — SERTRALINE HCL 50 MG PO TABS: 50.0000 mg | ORAL_TABLET | Freq: Every day | ORAL | 3 refills | Status: DC

## 2021-07-23 ENCOUNTER — Other Ambulatory Visit: Payer: Self-pay | Admitting: Family Medicine

## 2021-07-29 ENCOUNTER — Other Ambulatory Visit: Payer: Self-pay | Admitting: Family Medicine

## 2021-08-08 ENCOUNTER — Other Ambulatory Visit: Payer: Self-pay | Admitting: Family Medicine

## 2021-08-11 NOTE — Telephone Encounter (Signed)
ERx 

## 2021-08-11 NOTE — Telephone Encounter (Signed)
Refill request Alprazolam Last office visit 06/09/21 Last refill 06/09/21 #30/1

## 2021-09-15 ENCOUNTER — Ambulatory Visit: Payer: Medicare HMO | Admitting: Family Medicine

## 2021-10-09 ENCOUNTER — Other Ambulatory Visit: Payer: Self-pay | Admitting: Family Medicine

## 2021-10-13 NOTE — Telephone Encounter (Signed)
Plz address in Dr. Synthia Innocent absence.  Name of Medication: Alprazolam Name of Pharmacy: [facility] Last Fill or Written Date and Quantity: 09/09/21, #30 Last Office Visit and Type: 06/09/21, AWV Next Office Visit and Type: none Last Controlled Substance Agreement Date: 10/24/14 Last UDS: 01/22/15

## 2021-10-13 NOTE — Telephone Encounter (Signed)
Pt called checking status of her medication. Please advise.

## 2021-10-14 NOTE — Telephone Encounter (Signed)
Sent. Thanks.   

## 2021-10-22 DIAGNOSIS — H52213 Irregular astigmatism, bilateral: Secondary | ICD-10-CM | POA: Diagnosis not present

## 2021-10-22 DIAGNOSIS — H524 Presbyopia: Secondary | ICD-10-CM | POA: Diagnosis not present

## 2021-10-22 DIAGNOSIS — H5213 Myopia, bilateral: Secondary | ICD-10-CM | POA: Diagnosis not present

## 2021-10-22 DIAGNOSIS — H04123 Dry eye syndrome of bilateral lacrimal glands: Secondary | ICD-10-CM | POA: Diagnosis not present

## 2021-10-22 DIAGNOSIS — H0288A Meibomian gland dysfunction right eye, upper and lower eyelids: Secondary | ICD-10-CM | POA: Diagnosis not present

## 2021-10-22 DIAGNOSIS — H0288B Meibomian gland dysfunction left eye, upper and lower eyelids: Secondary | ICD-10-CM | POA: Diagnosis not present

## 2021-10-26 ENCOUNTER — Other Ambulatory Visit: Payer: Self-pay | Admitting: Family Medicine

## 2021-10-27 ENCOUNTER — Encounter: Payer: Self-pay | Admitting: Internal Medicine

## 2021-11-06 ENCOUNTER — Other Ambulatory Visit: Payer: Self-pay | Admitting: Family Medicine

## 2021-11-07 NOTE — Telephone Encounter (Signed)
Refill request for ALPRAZOLAM 1 MG TABLET ? ?LOV - 06/09/21 ?Next OV - not scheduled ?Last refill - 10/14/21 #30/0 ? ?

## 2021-11-07 NOTE — Telephone Encounter (Signed)
ERx 

## 2021-11-24 DIAGNOSIS — H52209 Unspecified astigmatism, unspecified eye: Secondary | ICD-10-CM | POA: Diagnosis not present

## 2021-11-24 DIAGNOSIS — H5213 Myopia, bilateral: Secondary | ICD-10-CM | POA: Diagnosis not present

## 2021-11-29 HISTORY — PX: COLONOSCOPY: SHX174

## 2021-12-02 ENCOUNTER — Ambulatory Visit (AMBULATORY_SURGERY_CENTER): Payer: Medicare HMO | Admitting: *Deleted

## 2021-12-02 VITALS — Ht 66.0 in | Wt 146.0 lb

## 2021-12-02 DIAGNOSIS — Z1211 Encounter for screening for malignant neoplasm of colon: Secondary | ICD-10-CM

## 2021-12-02 NOTE — Progress Notes (Signed)

## 2021-12-08 ENCOUNTER — Other Ambulatory Visit: Payer: Self-pay | Admitting: Family Medicine

## 2021-12-08 NOTE — Telephone Encounter (Signed)
ERx 

## 2021-12-08 NOTE — Telephone Encounter (Signed)
Refill request Alprazolam ?Last refill 11/07/21 #30 ?Last office visit 06/09/21 ?

## 2021-12-12 ENCOUNTER — Encounter: Payer: Self-pay | Admitting: Internal Medicine

## 2021-12-23 ENCOUNTER — Encounter: Payer: Self-pay | Admitting: Internal Medicine

## 2021-12-23 ENCOUNTER — Ambulatory Visit (AMBULATORY_SURGERY_CENTER): Payer: Medicare HMO | Admitting: Internal Medicine

## 2021-12-23 VITALS — BP 109/61 | HR 63 | Temp 96.8°F | Resp 18 | Ht 66.0 in | Wt 146.0 lb

## 2021-12-23 DIAGNOSIS — Z1211 Encounter for screening for malignant neoplasm of colon: Secondary | ICD-10-CM

## 2021-12-23 MED ORDER — SODIUM CHLORIDE 0.9 % IV SOLN: 500.0000 mL | INTRAVENOUS | Status: DC
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
## 2021-12-23 NOTE — Progress Notes (Signed)
Pt non-responsive, VVS, Report to RN  °

## 2021-12-23 NOTE — Op Note (Signed)
[place] ?Patient Name: Rachel Mcintosh ?Procedure Date: 12/23/2021 11:43 AM ?MRN: [id number] ?Endoscopist: Gatha Mayer , MD ?Age: 66 ?Referring MD:  ?Date of Birth: November 20, 1955 ?Gender: Female ?Account #: [id number] ?Procedure:                Colonoscopy ?Indications:              Screening for colorectal malignant neoplasm ?Medicines:                Monitored Anesthesia Care ?Procedure:                Pre-Anesthesia Assessment: ?                          - Prior to the procedure, a History and Physical  ?                          was performed, and patient medications and  ?                          allergies were reviewed. The patient's tolerance of  ?                          previous anesthesia was also reviewed. The risks  ?                          and benefits of the procedure and the sedation  ?                          options and risks were discussed with the patient.  ?                          All questions were answered, and informed consent  ?                          was obtained. Prior Anticoagulants: The patient has  ?                          taken no previous anticoagulant or antiplatelet  ?                          agents. ASA Grade Assessment: II - A patient with  ?                          mild systemic disease. After reviewing the risks  ?                          and benefits, the patient was deemed in  ?                          satisfactory condition to undergo the procedure. ?                          After obtaining informed consent, the colonoscope  ?  was passed under direct vision. Throughout the  ?                          procedure, the patient's blood pressure, pulse, and  ?                          oxygen saturations were monitored continuously. The  ?                          Olympus PCF-[id number] (#[id number]) Colonoscope was  ?                          introduced through the anus and advanced to the the  ?                          cecum, identified  by appendiceal orifice and  ?                          ileocecal valve. The colonoscopy was performed  ?                          without difficulty. The patient tolerated the  ?                          procedure well. The quality of the bowel  ?                          preparation was excellent. The ileocecal valve,  ?                          appendiceal orifice, and rectum were photographed.  ?                          The bowel preparation used was Miralax via split  ?                          dose instruction. ?Scope In: 11:51:05 AM ?Scope Out: 12:02:35 PM ?Scope Withdrawal Time: 0 hours 8 minutes 57 seconds  ?Total Procedure Duration: 0 hours 11 minutes 30 seconds  ?Findings:                 The perianal and digital rectal examinations were  ?                          normal. ?                          Multiple diverticula were found in the sigmoid  ?                          colon. ?                          The exam was otherwise without abnormality on  ?  direct and retroflexion views. ?Complications:            No immediate complications. ?Estimated Blood Loss:     Estimated blood loss: none. ?Impression:               - Diverticulosis in the sigmoid colon. ?                          - The examination was otherwise normal on direct  ?                          and retroflexion views. ?                          - No specimens collected. ?Recommendation:           - Patient has a contact number available for  ?                          emergencies. The signs and symptoms of potential  ?                          delayed complications were discussed with the  ?                          patient. Return to normal activities tomorrow.  ?                          Written discharge instructions were provided to the  ?                          patient. ?                          - Resume previous diet. ?                          - Continue present medications. ?                          -  Repeat colonoscopy in 10 years for screening  ?                          purposes. ?Gatha Mayer, MD ?12/23/2021 12:10:24 PM ?This report has been signed electronically. ?

## 2021-12-23 NOTE — Patient Instructions (Addendum)
No polyps or cancer found. ?You do have diverticulosis - thickened muscle rings and pouches in the colon wall. Please read the handout about this condition. ? ?Next routine colonoscopy or other screening test in 10 years - 2033. ? ?I appreciate the opportunity to care for you. ?Gatha Mayer, MD, [facility] ? ?Handout on diverticulosis given. ? ? ?YOU HAD AN ENDOSCOPIC PROCEDURE TODAY AT [facility]:   Refer to the procedure report that was given to you for any specific questions about what was found during the examination.  If the procedure report does not answer your questions, please call your gastroenterologist to clarify.  If you requested that your care partner not be given the details of your procedure findings, then the procedure report has been included in a sealed envelope for you to review at your convenience later. ? ?YOU SHOULD EXPECT: Some feelings of bloating in the abdomen. Passage of more gas than usual.  Walking can help get rid of the air that was put into your GI tract during the procedure and reduce the bloating. If you had a lower endoscopy (such as a colonoscopy or flexible sigmoidoscopy) you may notice spotting of blood in your stool or on the toilet paper. If you underwent a bowel prep for your procedure, you may not have a normal bowel movement for a few days. ? ?Please Note:  You might notice some irritation and congestion in your nose or some drainage.  This is from the oxygen used during your procedure.  There is no need for concern and it should clear up in a day or so. ? ?SYMPTOMS TO REPORT IMMEDIATELY: ? ?Following lower endoscopy (colonoscopy or flexible sigmoidoscopy): ? Excessive amounts of blood in the stool ? Significant tenderness or worsening of abdominal pains ? Swelling of the abdomen that is new, acute ? Fever of 100?F or higher ? ?For urgent or emergent issues, a gastroenterologist can be reached at any hour by calling [phone]. ?Do not use MyChart  messaging for urgent concerns.  ? ? ?DIET:  We do recommend a small meal at first, but then you may proceed to your regular diet.  Drink plenty of fluids but you should avoid alcoholic beverages for 24 hours. ? ?ACTIVITY:  You should plan to take it easy for the rest of today and you should NOT DRIVE or use heavy machinery until tomorrow (because of the sedation medicines used during the test).   ? ?FOLLOW UP: ?Our staff will call the number listed on your records 48-72 hours following your procedure to check on you and address any questions or concerns that you may have regarding the information given to you following your procedure. If we do not reach you, we will leave a message.  We will attempt to reach you two times.  During this call, we will ask if you have developed any symptoms of COVID 19. If you develop any symptoms (ie: fever, flu-like symptoms, shortness of breath, cough etc.) before then, please call [phone].  If you test positive for Covid 19 in the 2 weeks post procedure, please call and report this information to [place].   ? ?If any biopsies were taken you will be contacted by phone or by letter within the next 1-3 weeks.  Please call us at [phone] if you have not heard about the biopsies in 3 weeks.  ? ? ?SIGNATURES/CONFIDENTIALITY: ?You and/or your care partner have signed paperwork which will be entered into your electronic medical  record.  These signatures attest to the fact that that the information above on your After Visit Summary has been reviewed and is understood.  Full responsibility of the confidentiality of this discharge information lies with you and/or your care-partner.  ? ?

## 2021-12-25 ENCOUNTER — Telehealth: Payer: Self-pay

## 2021-12-25 NOTE — Telephone Encounter (Signed)
?  Follow up Call- ? ? ?  12/23/2021  ? 10:52 AM 12/05/2020  ?  9:29 AM  ?Call back number  ?Post procedure Call Back phone  # [phone]  ?Permission to leave phone message Yes Yes  ?  ? ?Patient questions: ? ?Do you have a fever, pain , or abdominal swelling? No. ?Pain Score  0 * ? ?Have you tolerated food without any problems? Yes.   ? ?Have you been able to return to your normal activities? Yes.   ? ?Do you have any questions about your discharge instructions: ?Diet   No. ?Medications  No. ?Follow up visit  No. ? ?Do you have questions or concerns about your Care? No. ? ?Actions: ?* If pain score is 4 or above: ?No action needed, pain <4. ? ? ?

## 2022-01-15 ENCOUNTER — Ambulatory Visit
Admission: EM | Admit: 2022-01-15 | Discharge: 2022-01-15 | Disposition: A | Discharge status: Home / Self Care | Payer: Medicare HMO | Attending: Family Medicine | Admitting: Family Medicine

## 2022-01-15 ENCOUNTER — Ambulatory Visit (INDEPENDENT_AMBULATORY_CARE_PROVIDER_SITE_OTHER): Payer: Medicare HMO

## 2022-01-15 DIAGNOSIS — R918 Other nonspecific abnormal finding of lung field: Secondary | ICD-10-CM | POA: Diagnosis not present

## 2022-01-15 DIAGNOSIS — R0781 Pleurodynia: Secondary | ICD-10-CM

## 2022-01-15 DIAGNOSIS — J439 Emphysema, unspecified: Secondary | ICD-10-CM | POA: Diagnosis not present

## 2022-01-15 DIAGNOSIS — I700 Atherosclerosis of aorta: Secondary | ICD-10-CM | POA: Diagnosis not present

## 2022-01-15 MED ORDER — NAPROXEN 500 MG PO TABS: 500.0000 mg | ORAL_TABLET | Freq: Two times a day (BID) | ORAL | 0 refills | Status: DC | PRN

## 2022-01-15 MED ORDER — TIZANIDINE HCL 4 MG PO TABS: 2.0000 mg | ORAL_TABLET | Freq: Every day | ORAL | 0 refills | Status: DC | PRN

## 2022-01-15 MED ORDER — KETOROLAC TROMETHAMINE 30 MG/ML IJ SOLN
30.0000 mg | Freq: Once | INTRAMUSCULAR | Status: AC
  Administered 2022-01-15: 30 mg via INTRAMUSCULAR
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
## 2022-01-15 NOTE — Discharge Instructions (Signed)
You have been given a shot of Toradol 30 mg today.   Naproxen 500 mg--1 tab 2 times daily as needed for pain.  Tizanidine 4 mg--take 1/2 of a tablet at bedtime as needed for muscle pain/spasm. This can cause dizziness and sleepiness.  Heating pad to the sore area.

## 2022-01-15 NOTE — ED Triage Notes (Signed)
Pt present left side abdomen below rib cage pain. Symptoms started two days ago. Pt states unsure if she pulled a muscle or injured that area.

## 2022-01-23 ENCOUNTER — Telehealth: Payer: Self-pay | Admitting: Family Medicine

## 2022-01-23 NOTE — Telephone Encounter (Signed)
Lvm asking pt to call back.  Need to relay Dr. Synthia Innocent message.  Also, sending message via [place].

## 2022-01-23 NOTE — Telephone Encounter (Signed)
Please call-received notice from [facility] about a recall for Xanax that patient may have filled from October to April of this past year.  If she still has Xanax would have her hold medication until she contacts [facility] to see if her lots were affected.

## 2022-01-27 NOTE — Telephone Encounter (Signed)
Patient notified as instructed by telephone and verbalized understanding. Patient stated that she received the letter but threw it away. Patient stated that she has been getting [facility]. Patient stated that she called her local pharmacy and the guy that answered the phone was not aware of a recall. Patient was advised to contact [facility] and get more information from then and she stated that she will call them when she gets home.

## 2022-01-29 ENCOUNTER — Other Ambulatory Visit: Payer: Self-pay | Admitting: Family Medicine

## 2022-01-29 DIAGNOSIS — Z1231 Encounter for screening mammogram for malignant neoplasm of breast: Secondary | ICD-10-CM

## 2022-02-03 DIAGNOSIS — Z87891 Personal history of nicotine dependence: Secondary | ICD-10-CM | POA: Diagnosis not present

## 2022-02-03 DIAGNOSIS — K219 Gastro-esophageal reflux disease without esophagitis: Secondary | ICD-10-CM | POA: Diagnosis not present

## 2022-02-03 DIAGNOSIS — R030 Elevated blood-pressure reading, without diagnosis of hypertension: Secondary | ICD-10-CM | POA: Diagnosis not present

## 2022-02-03 DIAGNOSIS — Z8249 Family history of ischemic heart disease and other diseases of the circulatory system: Secondary | ICD-10-CM | POA: Diagnosis not present

## 2022-02-03 DIAGNOSIS — Z89612 Acquired absence of left leg above knee: Secondary | ICD-10-CM | POA: Diagnosis not present

## 2022-02-03 DIAGNOSIS — Z809 Family history of malignant neoplasm, unspecified: Secondary | ICD-10-CM | POA: Diagnosis not present

## 2022-02-03 DIAGNOSIS — J439 Emphysema, unspecified: Secondary | ICD-10-CM | POA: Diagnosis not present

## 2022-02-03 DIAGNOSIS — R32 Unspecified urinary incontinence: Secondary | ICD-10-CM | POA: Diagnosis not present

## 2022-02-03 DIAGNOSIS — R69 Illness, unspecified: Secondary | ICD-10-CM | POA: Diagnosis not present

## 2022-02-03 DIAGNOSIS — Z91041 Radiographic dye allergy status: Secondary | ICD-10-CM | POA: Diagnosis not present

## 2022-03-09 ENCOUNTER — Encounter: Payer: Self-pay | Admitting: Family Medicine

## 2022-03-09 ENCOUNTER — Telehealth: Payer: Self-pay | Admitting: Family Medicine

## 2022-03-09 ENCOUNTER — Telehealth (INDEPENDENT_AMBULATORY_CARE_PROVIDER_SITE_OTHER): Payer: Medicare HMO | Admitting: Family Medicine

## 2022-03-09 DIAGNOSIS — J209 Acute bronchitis, unspecified: Secondary | ICD-10-CM | POA: Diagnosis not present

## 2022-03-09 MED ORDER — PREDNISONE 10 MG PO TABS: ORAL_TABLET | ORAL | 0 refills | Status: DC

## 2022-03-09 NOTE — Telephone Encounter (Signed)
Rachel Mcintosh called and said that they only got part of a prescription sent in for predniSONE (DELTASONE) 10 MG tablet and asked if it can be given to them verbally, I put on park and [name] took call to give to them

## 2022-03-09 NOTE — Patient Instructions (Signed)
Take prednisone as directed for chest and head congestion /cough and wheezing  It may make you feel hyper and hungry  Keep up fluids  Continue tussin and albuterol as needed  If symptoms become severe please go to the ER  Update if not starting to improve in a week or if worsening

## 2022-03-09 NOTE — Assessment & Plan Note (Signed)
With uri/cough and sinus congestion  Suspect started as a cold 2 wk ago, did not do covid test however  In setting of copd/ continued cough with wheeze  No fever or purulent mucous to indicate bacterial infection but will watch for  Px prednisone 40 mg taper and discussed poss side eff Continue tussin, nasal spray and albuterol mdi Symptom control and ER precautions discussed Update if not starting to improve in a week or if worsening

## 2022-03-09 NOTE — Telephone Encounter (Signed)
Per Dr. Glori Bickers script was given verbally over the phone to the pharmacist.
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
## 2022-04-30 ENCOUNTER — Other Ambulatory Visit: Payer: Self-pay | Admitting: Family Medicine

## 2022-04-30 NOTE — Telephone Encounter (Signed)
Name of Medication: Alprazolam Name of Pharmacy: [facility] Last Fill or Written Date and Quantity: 12/08/21, #30 Last Office Visit and Type: 06/09/21, AWV Next Office Visit and Type: 06/15/22, CPE Last Controlled Substance Agreement Date: 12/23/14 Last UDS: 12/23/14

## 2022-05-01 NOTE — Telephone Encounter (Signed)
ERx 

## 2022-05-18 ENCOUNTER — Ambulatory Visit (INDEPENDENT_AMBULATORY_CARE_PROVIDER_SITE_OTHER): Payer: Medicare HMO | Admitting: Internal Medicine

## 2022-05-18 ENCOUNTER — Encounter: Payer: Self-pay | Admitting: Internal Medicine

## 2022-05-18 DIAGNOSIS — J209 Acute bronchitis, unspecified: Secondary | ICD-10-CM | POA: Diagnosis not present

## 2022-05-18 MED ORDER — PREDNISONE 20 MG PO TABS: 40.0000 mg | ORAL_TABLET | Freq: Every day | ORAL | 0 refills | Status: DC
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
## 2022-05-18 NOTE — Assessment & Plan Note (Signed)
Still seems viral Feels it in her chest and head---some SOB (but not tight)--smoked until 7 years ago Will treat with prednisone again-- 40 x 3, 20 x 3 If worsens in the next couple of days--will add a z-pak

## 2022-05-20 ENCOUNTER — Encounter: Payer: Self-pay | Admitting: Internal Medicine

## 2022-05-20 MED ORDER — AZITHROMYCIN 250 MG PO TABS
ORAL_TABLET | ORAL | 0 refills | Status: DC
Start: 2022-05-20 — End: 2022-06-01

## 2022-06-01 ENCOUNTER — Encounter: Payer: Self-pay | Admitting: Nurse Practitioner

## 2022-06-01 ENCOUNTER — Ambulatory Visit (INDEPENDENT_AMBULATORY_CARE_PROVIDER_SITE_OTHER): Payer: Medicare HMO | Admitting: Nurse Practitioner

## 2022-06-01 ENCOUNTER — Ambulatory Visit (INDEPENDENT_AMBULATORY_CARE_PROVIDER_SITE_OTHER)
Admission: RE | Admit: 2022-06-01 | Discharge: 2022-06-01 | Disposition: A | Discharge status: Home / Self Care | Payer: Medicare HMO | Source: Ambulatory Visit | Attending: Nurse Practitioner | Admitting: Nurse Practitioner

## 2022-06-01 VITALS — BP 126/78 | HR 67 | Temp 96.4°F | Resp 14 | Wt 148.2 lb

## 2022-06-01 DIAGNOSIS — R0602 Shortness of breath: Secondary | ICD-10-CM | POA: Diagnosis not present

## 2022-06-01 DIAGNOSIS — J439 Emphysema, unspecified: Secondary | ICD-10-CM | POA: Diagnosis not present

## 2022-06-01 DIAGNOSIS — H9313 Tinnitus, bilateral: Secondary | ICD-10-CM | POA: Diagnosis not present

## 2022-06-01 MED ORDER — PREDNISONE 10 MG PO TABS: ORAL_TABLET | ORAL | 0 refills | Status: AC

## 2022-06-01 MED ORDER — ALBUTEROL SULFATE HFA 108 (90 BASE) MCG/ACT IN AERS: 2.0000 | INHALATION_SPRAY | Freq: Four times a day (QID) | RESPIRATORY_TRACT | 0 refills | Status: DC | PRN

## 2022-06-01 NOTE — Assessment & Plan Note (Signed)
Been going on for some time per patient report.  Ears on exam within normal limits.  No use of aspirin or furosemide.  Did inform patient for further evaluation will need to see audiology and ENT.  Patient acknowledged

## 2022-06-01 NOTE — Progress Notes (Signed)
Established Patient Office Visit  Subjective   Patient ID: Rachel Mcintosh, female    DOB: February 21, 1956  Age: 66 y.o. MRN: [id number]  Chief Complaint  Patient presents with   Cough    Was seen on 05/18/22 for bronchitis,-improved but did not resolve- feels very winded and worn out with not much of exertion, cough at times, some days more stuffy/congested feeling. Finished Zpack and Prednisone.      Cough: States that she was seend for the same in July and written steroids and that helped. States that she was seen in 05/18/2022 written prednisone and azithromycin. States she is not sure the zpak did not do much. States she tohougt it was getting better but has not. States that she is tender on the chest. States with a deep breath she can feel it moving. States that she woke up this morning she could not get comforable because of the inabilty of catching a good breath. Since 2 and half weeks ago. Worse since Friday   Albuterol at home that she has used it and it does not feel like it has helped.  No history DVT, srugery or long travel  [place]is: patient states she has been dealing with this for serval months at least. States that she has not been evlautaed for it. No use of ASA or lasix. States does not always notice it but does in quite room    Review of Systems  Constitutional:  Negative for chills and fever.  HENT:  Positive for tinnitus.   Respiratory:  Positive for cough and shortness of breath. Negative for wheezing.   Cardiovascular:  Negative for chest pain and leg swelling.      Objective:     BP 126/78   Pulse 67   Temp (!) 96.4 F (35.8 C) (Temporal)   Resp 14   Wt 148 lb 4 oz (67.2 kg)   SpO2 97%   BMI 23.93 kg/m    Physical Exam Vitals and nursing note reviewed.  Constitutional:      Appearance: Normal appearance.  HENT:     Right Ear: Tympanic membrane, ear canal and external ear normal.     Left Ear: Tympanic membrane, ear canal and external ear  normal.  Cardiovascular:     Rate and Rhythm: Normal rate and regular rhythm.     Heart sounds: Normal heart sounds.  Pulmonary:     Effort: Pulmonary effort is normal.     Comments: Diminished  Musculoskeletal:     Comments: Left AKA  Neurological:     Mental Status: She is alert.      No results found for any visits on 06/01/22.    The 10-year ASCVD risk score (Arnett DK, et al., 2019) is: 4.2%    Assessment & Plan:   Problem List Items Addressed This Visit       Other   Shortness of breath - Primary    Ambiguous in nature.  Patient recently finished a course of azithromycin and prednisone without great relief.  States she was seen earlier in the year approximately in July given a course of prednisone that worked better.  It was a longer taper.  Will write patient longer taper prednisone.  Also obtain chest x-ray in office today.  Patient has a low suspicion likelihood of a PE.  Patient does have IVC filter in place.  Pending x-ray results      Relevant Medications   predniSONE (DELTASONE) 10 MG tablet   albuterol (  VENTOLIN HFA) 108 (90 Base) MCG/ACT inhaler   Other Relevant Orders   DG Chest 2 View (Completed)   Tinnitus of both ears    Been going on for some time per patient report.  Ears on exam within normal limits.  No use of aspirin or furosemide.  Did inform patient for further evaluation will need to see audiology and ENT.  Patient acknowledged       Return if symptoms worsen or fail to improve.    Romilda Garret, NP

## 2022-06-01 NOTE — Patient Instructions (Addendum)
Nice to see you today I will be in touch with the labs once I have the results Follow up if no improvement   I will do prednisone again but a longer slower taper

## 2022-06-01 NOTE — Assessment & Plan Note (Signed)
Ambiguous in nature.  Patient recently finished a course of azithromycin and prednisone without great relief.  States she was seen earlier in the year approximately in July given a course of prednisone that worked better.  It was a longer taper.  Will write patient longer taper prednisone.  Also obtain chest x-ray in office today.  Patient has a low suspicion likelihood of a PE.  Patient does have IVC filter in place.  Pending x-ray results

## 2022-06-04 ENCOUNTER — Other Ambulatory Visit: Payer: Self-pay | Admitting: Family Medicine

## 2022-06-04 DIAGNOSIS — E7800 Pure hypercholesterolemia, unspecified: Secondary | ICD-10-CM

## 2022-06-04 DIAGNOSIS — M8580 Other specified disorders of bone density and structure, unspecified site: Secondary | ICD-10-CM

## 2022-06-04 DIAGNOSIS — E538 Deficiency of other specified B group vitamins: Secondary | ICD-10-CM

## 2022-06-08 ENCOUNTER — Other Ambulatory Visit (INDEPENDENT_AMBULATORY_CARE_PROVIDER_SITE_OTHER): Payer: Medicare HMO

## 2022-06-08 DIAGNOSIS — E538 Deficiency of other specified B group vitamins: Secondary | ICD-10-CM | POA: Diagnosis not present

## 2022-06-08 DIAGNOSIS — M8580 Other specified disorders of bone density and structure, unspecified site: Secondary | ICD-10-CM | POA: Diagnosis not present

## 2022-06-08 DIAGNOSIS — E7800 Pure hypercholesterolemia, unspecified: Secondary | ICD-10-CM | POA: Diagnosis not present

## 2022-06-08 LAB — COMPREHENSIVE METABOLIC PANEL
ALT: 9 U/L (ref 0–35)
AST: 9 U/L (ref 0–37)
Albumin: 4.3 g/dL (ref 3.5–5.2)
Alkaline Phosphatase: 59 U/L (ref 39–117)
BUN: 16 mg/dL (ref 6–23)
CO2: 28 mEq/L (ref 19–32)
Calcium: 9.5 mg/dL (ref 8.4–10.5)
Chloride: 100 mEq/L (ref 96–112)
Creatinine, Ser: 0.67 mg/dL (ref 0.40–1.20)
GFR: 92.69 mL/min (ref >60.00)
Glucose, Bld: 87 mg/dL (ref 70–99)
Potassium: 3.8 mEq/L (ref 3.5–5.1)
Sodium: 137 mEq/L (ref 135–145)
Total Bilirubin: 0.5 mg/dL (ref 0.2–1.2)
Total Protein: 7 g/dL (ref 6.0–8.3)

## 2022-06-08 LAB — LIPID PANEL
Cholesterol: 261 mg/dL — ABNORMAL HIGH (ref 0–200)
HDL: 90 mg/dL (ref >39.00)
LDL Cholesterol: 145 mg/dL — ABNORMAL HIGH (ref 0–99)
NonHDL: 171.43
Total CHOL/HDL Ratio: 3
Triglycerides: 134 mg/dL (ref 0.0–149.0)
VLDL: 26.8 mg/dL (ref 0.0–40.0)

## 2022-06-08 LAB — VITAMIN B12: Vitamin B-12: 472 pg/mL (ref 211–911)

## 2022-06-08 LAB — VITAMIN D 25 HYDROXY (VIT D DEFICIENCY, FRACTURES): VITD: 32.48 ng/mL (ref 30.00–100.00)

## 2022-06-09 ENCOUNTER — Encounter: Payer: Medicare HMO | Admitting: Family Medicine

## 2022-06-10 ENCOUNTER — Ambulatory Visit: Payer: Medicare HMO

## 2022-06-15 ENCOUNTER — Telehealth: Payer: Self-pay | Admitting: Family Medicine

## 2022-06-15 ENCOUNTER — Ambulatory Visit (INDEPENDENT_AMBULATORY_CARE_PROVIDER_SITE_OTHER): Payer: Medicare HMO | Admitting: Family Medicine

## 2022-06-15 ENCOUNTER — Encounter: Payer: Self-pay | Admitting: Family Medicine

## 2022-06-15 VITALS — BP 134/84 | HR 67 | Temp 98.0°F | Ht 65.5 in | Wt 148.5 lb

## 2022-06-15 DIAGNOSIS — R0602 Shortness of breath: Secondary | ICD-10-CM

## 2022-06-15 DIAGNOSIS — Z95828 Presence of other vascular implants and grafts: Secondary | ICD-10-CM

## 2022-06-15 DIAGNOSIS — Z7189 Other specified counseling: Secondary | ICD-10-CM

## 2022-06-15 DIAGNOSIS — Z0000 Encounter for general adult medical examination without abnormal findings: Secondary | ICD-10-CM | POA: Diagnosis not present

## 2022-06-15 DIAGNOSIS — I700 Atherosclerosis of aorta: Secondary | ICD-10-CM

## 2022-06-15 DIAGNOSIS — J439 Emphysema, unspecified: Secondary | ICD-10-CM

## 2022-06-15 DIAGNOSIS — E538 Deficiency of other specified B group vitamins: Secondary | ICD-10-CM

## 2022-06-15 DIAGNOSIS — K219 Gastro-esophageal reflux disease without esophagitis: Secondary | ICD-10-CM

## 2022-06-15 DIAGNOSIS — E7800 Pure hypercholesterolemia, unspecified: Secondary | ICD-10-CM

## 2022-06-15 DIAGNOSIS — S78112A Complete traumatic amputation at level between left hip and knee, initial encounter: Secondary | ICD-10-CM

## 2022-06-15 DIAGNOSIS — Z87891 Personal history of nicotine dependence: Secondary | ICD-10-CM

## 2022-06-15 DIAGNOSIS — M8589 Other specified disorders of bone density and structure, multiple sites: Secondary | ICD-10-CM

## 2022-06-15 MED ORDER — DOXYCYCLINE HYCLATE 100 MG PO TABS: 100.0000 mg | ORAL_TABLET | Freq: Two times a day (BID) | ORAL | 0 refills | Status: DC

## 2022-06-15 MED ORDER — UMECLIDINIUM BROMIDE 62.5 MCG/ACT IN AEPB: 1.0000 | INHALATION_SPRAY | Freq: Every day | RESPIRATORY_TRACT | 11 refills | Status: DC

## 2022-06-15 NOTE — Assessment & Plan Note (Addendum)
?  COPD related - see above. Start Incruse Ellipta.  For ongoing symptoms after bronchitis treatment with CXR findings, WASP doxycycline provided and she will fill if no noted benefit with inhaler.

## 2022-06-15 NOTE — Assessment & Plan Note (Addendum)
Incidentally noted on prior imaging. Will order DEXA - # provided to call and try and schedule with upcoming mammogram.

## 2022-06-15 NOTE — Assessment & Plan Note (Signed)
Chronic, deteriorated control. Reviewed diet choices to improve LDL control. The 10-year ASCVD risk score (Arnett DK, et al., 2019) is: 4.5%   Values used to calculate the score:     Age: 66 years     Sex: Female     Is Non-Hispanic African American: No     Diabetic: No     Tobacco smoker: No     Systolic Blood Pressure: 546 mmHg     Is BP treated: No     HDL Cholesterol: 90 mg/dL     Total Cholesterol: 261 mg/dL

## 2022-06-15 NOTE — Assessment & Plan Note (Signed)
Ex smoker, quit 2016.  40 PY smoking history.  Refer to lung cancer screening program.

## 2022-06-15 NOTE — Addendum Note (Signed)
Addended by: Ria Bush on: 06/15/2022 04:42 PM   Modules accepted: Orders

## 2022-06-15 NOTE — Assessment & Plan Note (Signed)
Advanced directive - scanned into chart 03/2018. Daughter Charlann Noss would be HCPOA. ok with life support/code, but doesn not want prolonged life support if terminal condition.

## 2022-06-15 NOTE — Assessment & Plan Note (Signed)
Continue b12 526mg MWF dosing.

## 2022-06-15 NOTE — Assessment & Plan Note (Signed)

## 2022-06-15 NOTE — Patient Instructions (Addendum)
Will refer to lung cancer screening CT program.  Call to to schedule bone density scan on same day as mammogram if able: [facility] [phone] For lungs - try daily controller breathing medicine sent to pharmacy. Doxycycline prescription printed out to try if not improving with inhaler.  Good to see you today Return as needed or in 1 year for next physical.   Health Maintenance for Postmenopausal Women Menopause is a normal process in which your ability to get pregnant comes to an end. This process happens slowly over many months or years, usually between the ages of 46 and 22. Menopause is complete when you have missed your menstrual period for 12 months. It is important to talk with your health care provider about some of the most common conditions that affect women after menopause (postmenopausal women). These include heart disease, cancer, and bone loss (osteoporosis). Adopting a healthy lifestyle and getting preventive care can help to promote your health and wellness. The actions you take can also lower your chances of developing some of these common conditions. What are the signs and symptoms of menopause? During menopause, you may have the following symptoms: Hot flashes. These can be moderate or severe. Night sweats. Decrease in sex drive. Mood swings. Headaches. Tiredness (fatigue). Irritability. Memory problems. Problems falling asleep or staying asleep. Talk with your health care provider about treatment options for your symptoms. Do I need hormone replacement therapy? Hormone replacement therapy is effective in treating symptoms that are caused by menopause, such as hot flashes and night sweats. Hormone replacement carries certain risks, especially as you become older. If you are thinking about using estrogen or estrogen with progestin, discuss the benefits and risks with your health care provider. How can I reduce my risk for heart disease and stroke? The  risk of heart disease, heart attack, and stroke increases as you age. One of the causes may be a change in the body's hormones during menopause. This can affect how your body uses dietary fats, triglycerides, and cholesterol. Heart attack and stroke are medical emergencies. There are many things that you can do to help prevent heart disease and stroke. Watch your blood pressure High blood pressure causes heart disease and increases the risk of stroke. This is more likely to develop in people who have high blood pressure readings or are overweight. Have your blood pressure checked: Every 3-5 years if you are 87-53 years of age. Every year if you are 52 years old or older. Eat a healthy diet  Eat a diet that includes plenty of vegetables, fruits, low-fat dairy products, and lean protein. Do not eat a lot of foods that are high in solid fats, added sugars, or sodium. Get regular exercise Get regular exercise. This is one of the most important things you can do for your health. Most adults should: Try to exercise for at least 150 minutes each week. The exercise should increase your heart rate and make you sweat (moderate-intensity exercise). Try to do strengthening exercises at least twice each week. Do these in addition to the moderate-intensity exercise. Spend less time sitting. Even light physical activity can be beneficial. Other tips Work with your health care provider to achieve or maintain a healthy weight. Do not use any products that contain nicotine or tobacco. These products include cigarettes, chewing tobacco, and vaping devices, such as e-cigarettes. If you need help quitting, ask your health care provider. Know your numbers. Ask your health care provider to check your cholesterol  and your blood sugar (glucose). Continue to have your blood tested as directed by your health care provider. Do I need screening for cancer? Depending on your health history and family history, you may need to  have cancer screenings at different stages of your life. This may include screening for: Breast cancer. Cervical cancer. Lung cancer. Colorectal cancer. What is my risk for osteoporosis? After menopause, you may be at increased risk for osteoporosis. Osteoporosis is a condition in which bone destruction happens more quickly than new bone creation. To help prevent osteoporosis or the bone fractures that can happen because of osteoporosis, you may take the following actions: If you are 105-45 years old, get at least 1,000 mg of calcium and at least 600 international units (IU) of vitamin D per day. If you are older than age 58 but younger than age 40, get at least 1,200 mg of calcium and at least 600 international units (IU) of vitamin D per day. If you are older than age 30, get at least 1,200 mg of calcium and at least 800 international units (IU) of vitamin D per day. Smoking and drinking excessive alcohol increase the risk of osteoporosis. Eat foods that are rich in calcium and vitamin D, and do weight-bearing exercises several times each week as directed by your health care provider. How does menopause affect my mental health? Depression may occur at any age, but it is more common as you become older. Common symptoms of depression include: Feeling depressed. Changes in sleep patterns. Changes in appetite or eating patterns. Feeling an overall lack of motivation or enjoyment of activities that you previously enjoyed. Frequent crying spells. Talk with your health care provider if you think that you are experiencing any of these symptoms. General instructions See your health care provider for regular wellness exams and vaccines. This may include: Scheduling regular health, dental, and eye exams. Getting and maintaining your vaccines. These include: Influenza vaccine. Get this vaccine each year before the flu season begins. Pneumonia vaccine. Shingles vaccine. Tetanus, diphtheria, and  pertussis (Tdap) booster vaccine. Your health care provider may also recommend other immunizations. Tell your health care provider if you have ever been abused or do not feel safe at home. Summary Menopause is a normal process in which your ability to get pregnant comes to an end. This condition causes hot flashes, night sweats, decreased interest in sex, mood swings, headaches, or lack of sleep. Treatment for this condition may include hormone replacement therapy. Take actions to keep yourself healthy, including exercising regularly, eating a healthy diet, watching your weight, and checking your blood pressure and blood sugar levels. Get screened for cancer and depression. Make sure that you are up to date with all your vaccines. This information is not intended to replace advice given to you by your health care provider. Make sure you discuss any questions you have with your health care provider. Document Revised: 01/06/2021 Document Reviewed: 01/06/2021 Elsevier Patient Education  [place].

## 2022-06-15 NOTE — Assessment & Plan Note (Signed)
Noted on imaging, previously largely asymptomatic.  However she does get recurrent bronchitis flares, 2 in the past 3 months, and notes ongoing chest congestion and shortness of breath. Will benefit from daily controller inhaler.  Recommend start LAMA - umeclidinium (Incruse Ellipta) sent to pharmacy.  Would benefit from spirometry however none available at our office currently. Consider pulm referral.  She also continues PRN albuterol.

## 2022-06-15 NOTE — Progress Notes (Addendum)
Patient ID: Rachel Mcintosh, female    DOB: 1956-01-02, 66 y.o.   MRN: [id number]  This visit was conducted in person.  BP 134/84   Pulse 67   Temp 98 F (36.7 C) (Temporal)   Ht 5' 5.5" (1.664 m)   Wt 148 lb 8 oz (67.4 kg)   SpO2 97%   BMI 24.34 kg/m    CC: AMW/CPE Subjective:   HPI: Rachel Mcintosh is a 66 y.o. female presenting on 06/15/2022 for Medicare Wellness   Did not see health advisor.   Hearing Screening   '500Hz'$  '1000Hz'$  '2000Hz'$  '4000Hz'$   Right ear 25 40 20 40  Left ear '20 20 20 '$ 40  Vision Screening - Comments:: Last eye exam, 10/2021.  [place] Visit from 06/15/2022 in [facility] [place] [facility] [place]  PHQ-2 Total Score 1          06/15/2022   10:55 AM 06/09/2021   11:23 AM 09/04/2020   12:10 PM 06/03/2020    3:32 PM 04/17/2019   10:36 AM  Fall Risk   Falls in the past year? 0 0 0 0 0  Number falls in past yr:   0    Injury with Fall?   0    Follow up   Falls evaluation completed  Falls evaluation completed;Falls prevention discussed   Cares for mother with alzheimer's - mom is now in nursing home.   Seen 3 times the past 3 months with acute bronchitis, most recently treated with zpack and prednisone mid September, then prolonged prednisone taper early October. CXR at that time showed subtle RLL haziness. Notes ongoing chest congestion, something feels stuck mid chest. Stays short winded with PNdrainage, rhinorrhea, headache. No cough, fever, head congestion. She also uses flonase and inhaler and sinus medication. She uses albuterol inhaler with limited relief.   Doxy intolerance- worsened GERD. Would be willing to retry if needed, will take with food.   H/o emphysema on imaging studies - ex smoker.  H/o nasal trauma - fell and landed on her nose 2014. Wonders about ENT eval for chronic nasal congestion.   EGD 11/2020 - dilated esophageal web, mild schatzki ring also dilated, 1cm HH Carlean Purl)  Preventative: Colonoscopy 11/2021 -  diverticulosis, rpt 10 yrs [facility] Purl) [facility] - rescheduled for 07/2022.  Well woman exam 07/2012 [facility]; Dr. Christophe Louis). No paps 2/2 h/o hysterectomy. Ovaries remain. Normal pelvic US 2019.  Osteopenia - osteopenia by prior imaging. Will defer DEXA this year.  Lung cancer screening - eligible - will refer  Flu yearly  COVID vaccine - declines  Pneumovax 05/2012  Tetanus 2008, 08/2016 Shingrix - 12/2021, 02/2022 Advanced directive - scanned into chart 03/2018. Daughter Charlann Noss would be HCPOA. ok with life support/code, but doesn not want prolonged life support if terminal condition.  Seat belt use discussed Sunscreen use discussed. No changing moles. Ex smoker - quit 2016. Smoked 40 years 1 ppd.  Alcohol - seldom  Dentist Q6 mo  Eye exam - yearly  Bowel - no constipation Bladder - some stress incontinence - discussed kegels  Lives alone, caregiver of mother with dementia Edu: HS Occupation: disability since [place] 2008, was supervisor at [employer] Activity: no regular exercise  Diet: some water, fruits daily      Relevant past medical, surgical, family and social history reviewed and updated as indicated. Interim medical history since our last visit reviewed. Allergies and medications reviewed and updated. Outpatient Medications  Prior to Visit  Medication Sig Dispense Refill   albuterol (VENTOLIN HFA) 108 (90 Base) MCG/ACT inhaler Inhale 2 puffs into the lungs every 6 (six) hours as needed for wheezing or shortness of breath. 18 g 0   ALPRAZolam (XANAX) 1 MG tablet TAKE 1 TABLET BY MOUTH TWICE A DAY AS NEEDED FOR ANXIETY 30 tablet 0   Calcium Carbonate-Vitamin D3 (CALCIUM 600/VITAMIN D) 600-400 MG-UNIT TABS Take 1 tablet by mouth daily.     famotidine (PEPCID) 20 MG tablet Take 20 mg by mouth daily as needed for heartburn or indigestion.     fluticasone (FLONASE) 50 MCG/ACT nasal spray Place 2 sprays into both nostrils daily. 16 g 6    levocetirizine (XYZAL) 5 MG tablet Take 1 tablet (5 mg total) by mouth every evening. 14 tablet 0   vitamin B-12 (V-R VITAMIN B-12) 500 MCG tablet Take 1 tablet (500 mcg total) by mouth 3 (three) times a week.     No facility-administered medications prior to visit.     Per HPI unless specifically indicated in ROS section below Review of Systems  Constitutional:  Negative for activity change, appetite change, chills, fatigue, fever and unexpected weight change.  HENT:  Negative for hearing loss.   Eyes:  Negative for visual disturbance.  Respiratory:  Positive for shortness of breath. Negative for cough, chest tightness and wheezing.   Cardiovascular:  Positive for palpitations. Negative for chest pain and leg swelling.  Gastrointestinal:  Negative for abdominal distention, abdominal pain, blood in stool, constipation, diarrhea, nausea and vomiting.  Genitourinary:  Negative for difficulty urinating and hematuria.  Musculoskeletal:  Negative for arthralgias, myalgias and neck pain.  Skin:  Negative for rash.  Neurological:  Positive for headaches. Negative for dizziness, seizures and syncope.  Hematological:  Negative for adenopathy. Does not bruise/bleed easily.  Psychiatric/Behavioral:  Negative for dysphoric mood. The patient is not nervous/anxious.     Objective:  BP 134/84   Pulse 67   Temp 98 F (36.7 C) (Temporal)   Ht 5' 5.5" (1.664 m)   Wt 148 lb 8 oz (67.4 kg)   SpO2 97%   BMI 24.34 kg/m   Wt Readings from Last 3 Encounters:  06/15/22 148 lb 8 oz (67.4 kg)  06/01/22 148 lb 4 oz (67.2 kg)  05/18/22 148 lb (67.1 kg)      Physical Exam Vitals and nursing note reviewed.  Constitutional:      Appearance: Normal appearance. She is not ill-appearing.  HENT:     Head: Normocephalic and atraumatic.     Right Ear: Tympanic membrane, ear canal and external ear normal. There is no impacted cerumen.     Left Ear: Tympanic membrane, ear canal and external ear normal. There is  no impacted cerumen.     Nose: No congestion or rhinorrhea.     Mouth/Throat:     Mouth: Mucous membranes are moist.     Pharynx: Oropharynx is clear. No oropharyngeal exudate or posterior oropharyngeal erythema.  Eyes:     General:        Right eye: No discharge.        Left eye: No discharge.     Extraocular Movements: Extraocular movements intact.     Conjunctiva/sclera: Conjunctivae normal.     Pupils: Pupils are equal, round, and reactive to light.  Neck:     Thyroid: No thyroid mass or thyromegaly.     Vascular: No carotid bruit.  Cardiovascular:     Rate and Rhythm:  Normal rate and regular rhythm.     Pulses: Normal pulses.     Heart sounds: Normal heart sounds. No murmur heard. Pulmonary:     Effort: Pulmonary effort is normal. No respiratory distress.     Breath sounds: Normal breath sounds. No wheezing, rhonchi or rales.  Abdominal:     General: Bowel sounds are normal. There is no distension.     Palpations: Abdomen is soft. There is no mass.     Tenderness: There is no abdominal tenderness. There is no guarding or rebound.     Hernia: No hernia is present.  Musculoskeletal:     Cervical back: Normal range of motion and neck supple. No rigidity.     Right lower leg: No edema.     Left lower leg: No edema.     Comments: S/p L AKA with prosthetic in place  Lymphadenopathy:     Cervical: No cervical adenopathy.  Skin:    General: Skin is warm and dry.     Findings: No rash.  Neurological:     General: No focal deficit present.     Mental Status: She is alert. Mental status is at baseline.     Comments:  Recall 3/3 Calculation 5/5 DLROW  Psychiatric:        Mood and Affect: Mood normal.        Behavior: Behavior normal.       Results for orders placed or performed in visit on 06/08/22  VITAMIN D 25 Hydroxy (Vit-D Deficiency, Fractures)  Result Value Ref Range   VITD 32.48 30.00 - 100.00 ng/mL  Lipid panel  Result Value Ref Range   Cholesterol 261 (H) 0 -  200 mg/dL   Triglycerides 134.0 0.0 - 149.0 mg/dL   HDL 90.00 >39.00 mg/dL   VLDL 26.8 0.0 - 40.0 mg/dL   LDL Cholesterol 145 (H) 0 - 99 mg/dL   Total CHOL/HDL Ratio 3    NonHDL 171.43   Comprehensive metabolic panel  Result Value Ref Range   Sodium 137 135 - 145 mEq/L   Potassium 3.8 3.5 - 5.1 mEq/L   Chloride 100 96 - 112 mEq/L   CO2 28 19 - 32 mEq/L   Glucose, Bld 87 70 - 99 mg/dL   BUN 16 6 - 23 mg/dL   Creatinine, Ser 0.67 0.40 - 1.20 mg/dL   Total Bilirubin 0.5 0.2 - 1.2 mg/dL   Alkaline Phosphatase 59 39 - 117 U/L   AST 9 0 - 37 U/L   ALT 9 0 - 35 U/L   Total Protein 7.0 6.0 - 8.3 g/dL   Albumin 4.3 3.5 - 5.2 g/dL   GFR 92.69 >60.00 mL/min   Calcium 9.5 8.4 - 10.5 mg/dL  Vitamin B12  Result Value Ref Range   Vitamin B-12 472 211 - 911 pg/mL    Assessment & Plan:   Problem List Items Addressed This Visit     Medicare annual wellness visit, subsequent - Primary (Chronic)    I have personally reviewed the Medicare Annual Wellness questionnaire and have noted 1. The patient's medical and social history 2. Their use of alcohol, tobacco or illicit drugs 3. Their current medications and supplements 4. The patient's functional ability including ADL's, fall risks, home safety risks and hearing or visual impairment. Cognitive function has been assessed and addressed as indicated.  5. Diet and physical activity 6. Evidence for depression or mood disorders The patients weight, height, BMI have been recorded in the chart. I  have made referrals, counseling and provided education to the patient based on review of the above and I have provided the pt with a written personalized care plan for preventive services. Provider list updated.. See scanned questionairre as needed for further documentation. Reviewed preventative protocols and updated unless pt declined.       Advanced care planning/counseling discussion (Chronic)    Advanced directive - scanned into chart 03/2018.  Daughter Charlann Noss would be HCPOA. ok with life support/code, but doesn not want prolonged life support if terminal condition.       Health maintenance examination (Chronic)    Preventative protocols reviewed and updated unless pt declined. Discussed healthy diet and lifestyle.       Hyperlipidemia    Chronic, deteriorated control. Reviewed diet choices to improve LDL control. The 10-year ASCVD risk score (Arnett DK, et al., 2019) is: 4.5%   Values used to calculate the score:     Age: 69 years     Sex: Female     Is Non-Hispanic African American: No     Diabetic: No     Tobacco smoker: No     Systolic Blood Pressure: 097 mmHg     Is BP treated: No     HDL Cholesterol: 90 mg/dL     Total Cholesterol: 261 mg/dL       GERD (gastroesophageal reflux disease)    Chronic, stable on pepcid prn.       Above knee amputation of left lower extremity ([place])   Ex-smoker    Ex smoker, quit 2016.  40 PY smoking history.  Refer to lung cancer screening program.       Relevant Orders   Ambulatory Referral for Lung Cancer Scre   Emphysema/COPD [facility])    Noted on imaging, previously largely asymptomatic.  However she does get recurrent bronchitis flares, 2 in the past 3 months, and notes ongoing chest congestion and shortness of breath. Will benefit from daily controller inhaler.  Recommend start LAMA - umeclidinium (Incruse Ellipta) sent to pharmacy.  Would benefit from spirometry however none available at our office currently. Consider pulm referral.  She also continues PRN albuterol.       Relevant Medications   umeclidinium bromide (INCRUSE ELLIPTA) 62.5 MCG/ACT AEPB   Osteopenia    Incidentally noted on prior imaging. Will order DEXA - # provided to call and try and schedule with upcoming mammogram.       Relevant Orders   DG Bone Density   Presence of IVC filter   Abdominal aortic atherosclerosis ([place])    Consider statin, she is hesitant.       Vitamin B12 deficiency     Continue b12 575mg MWF dosing.       Shortness of breath    ?COPD related - see above. Start Incruse Ellipta.  For ongoing symptoms after bronchitis treatment with CXR findings, WASP doxycycline provided and she will fill if no noted benefit with inhaler.         Meds ordered this encounter  Medications   doxycycline (VIBRA-TABS) 100 MG tablet    Sig: Take 1 tablet (100 mg total) by mouth 2 (two) times daily.    Dispense:  20 tablet    Refill:  0   umeclidinium bromide (INCRUSE ELLIPTA) 62.5 MCG/ACT AEPB    Sig: Inhale 1 puff into the lungs daily.    Dispense:  30 each    Refill:  11   Orders Placed This Encounter  Procedures  DG Bone Density    Standing Status:   Future    Standing Expiration Date:   06/16/2023    Order Specific Question:   Reason for Exam (SYMPTOM  OR DIAGNOSIS REQUIRED)    Answer:   osteopenia eval    Order Specific Question:   Preferred imaging location?    Answer:   [facility]   Ambulatory Referral for Lung Cancer Scre    Referral Priority:   Routine    Referral Type:   Consultation    Referral Reason:   Specialty Services Required    Number of Visits Requested:   1    Patient instructions: Will refer to lung cancer screening CT program.  Call to to schedule bone density scan on same day as mammogram if able: [place] [phone] For lungs - try daily controller breathing medicine sent to pharmacy. Doxycycline prescription printed out to try if not improving with inhaler.  Good to see you today Return as needed or in 1 year for next physical.   Follow up plan: Return in about 1 year (around 06/16/2023) for annual exam, prior fasting for blood work, medicare wellness visit.  Ria Bush, MD

## 2022-06-15 NOTE — Assessment & Plan Note (Addendum)
Consider statin, she is hesitant.

## 2022-06-15 NOTE — Assessment & Plan Note (Signed)
Preventative protocols reviewed and updated unless pt declined. Discussed healthy diet and lifestyle.  

## 2022-06-15 NOTE — Telephone Encounter (Signed)
Pt called in stated when she call to schedule bone density scan on same day as mammogram she was told she needed an order from PCP for bone density  . Please advise [phone]

## 2022-06-15 NOTE — Telephone Encounter (Signed)
Spoke with Rachel Mcintosh relaying Dr. Synthia Innocent message.  Rachel Mcintosh says no worries and will call to schedule appts.

## 2022-06-15 NOTE — Assessment & Plan Note (Addendum)
Chronic, stable on pepcid prn.

## 2022-06-15 NOTE — Telephone Encounter (Signed)
I apologize I forgot to put order in. I have placed order she should be able to call and schedule now.

## 2022-06-23 ENCOUNTER — Other Ambulatory Visit: Payer: Self-pay | Admitting: *Deleted

## 2022-06-23 DIAGNOSIS — Z122 Encounter for screening for malignant neoplasm of respiratory organs: Secondary | ICD-10-CM

## 2022-06-23 DIAGNOSIS — Z87891 Personal history of nicotine dependence: Secondary | ICD-10-CM

## 2022-07-07 ENCOUNTER — Ambulatory Visit
Admission: RE | Admit: 2022-07-07 | Discharge: 2022-07-07 | Disposition: A | Discharge status: Home / Self Care | Payer: Medicare HMO | Source: Ambulatory Visit | Attending: Family Medicine | Admitting: Family Medicine

## 2022-07-07 DIAGNOSIS — Z1231 Encounter for screening mammogram for malignant neoplasm of breast: Secondary | ICD-10-CM

## 2022-07-29 ENCOUNTER — Ambulatory Visit (INDEPENDENT_AMBULATORY_CARE_PROVIDER_SITE_OTHER): Payer: Medicare HMO | Admitting: Pulmonary Disease

## 2022-07-29 ENCOUNTER — Encounter: Payer: Self-pay | Admitting: Pulmonary Disease

## 2022-07-29 DIAGNOSIS — Z122 Encounter for screening for malignant neoplasm of respiratory organs: Secondary | ICD-10-CM

## 2022-07-29 DIAGNOSIS — Z87891 Personal history of nicotine dependence: Secondary | ICD-10-CM | POA: Diagnosis not present

## 2022-07-29 NOTE — Progress Notes (Signed)
Shared Decision Making Visit Lung Cancer Screening Program [phone])   Eligibility: Age 66 y.o. Pack Years Smoking History Calculation 30 (# packs/per year x # years smoked) Recent History of coughing up blood  no Unexplained weight loss? no ( >Than 15 pounds within the last 6 months ) Prior History Lung / other cancer no (Diagnosis within the last 5 years already requiring surveillance chest CT Scans). Father had lung cancer.  Smoking Status Former Smoker Former Smokers: Years since quit: 7 years  Quit Date: 2016  Visit Components: Discussion included one or more decision making aids. yes Discussion included risk/benefits of screening. yes Discussion included potential follow up diagnostic testing for abnormal scans. yes Discussion included meaning and risk of over diagnosis. yes Discussion included meaning and risk of False Positives. yes Discussion included meaning of total radiation exposure. yes  Counseling Included: Importance of adherence to annual lung cancer LDCT screening. yes Impact of comorbidities on ability to participate in the program. yes Ability and willingness to under diagnostic treatment. yes  Smoking Cessation Counseling: Former Smokers:  Discussed the importance of maintaining cigarette abstinence. yes Diagnosis Code: Personal History of Nicotine Dependence. K56.256 Information about tobacco cessation classes and interventions provided to patient. Yes Patient provided with "ticket" for LDCT Scan. yes Written Order for Lung Cancer Screening with LDCT placed in Epic. Yes (CT Chest Lung Cancer Screening Low Dose W/O CM) [id number] Z12.2-Screening of respiratory organs Z87.891-Personal history of nicotine dependence   Lauraine Rinne, NP

## 2022-07-29 NOTE — Patient Instructions (Signed)
Thank you for participating in the [facility] Screening Program. It was our pleasure to meet you today. We will call you with the results of your scan within the next few days. Your scan will be assigned a Lung RADS category score by the physicians reading the scans.  This Lung RADS score determines follow up scanning.  See below for description of categories, and follow up screening recommendations. We will be in touch to schedule your follow up screening annually or based on recommendations of our providers. We will fax a copy of your scan results to your Primary Care Physician, or the physician who referred you to the program, to ensure they have the results. Please call the office if you have any questions or concerns regarding your scanning experience or results.  Our office number is [phone]. Please speak with Denise Phelps, RN. , or  Denise Buckner RN, They are  our Lung Cancer Screening RN.'s If They are unavailable when you call, Please leave a message on the voice mail. We will return your call at our earliest convenience.This voice mail is monitored several times a day.  Remember, if your scan is normal, we will scan you annually as long as you continue to meet the criteria for the program. (Age 55-77, Current smoker or smoker who has quit within the last 15 years). If you are a smoker, remember, quitting is the single most powerful action that you can take to decrease your risk of lung cancer and other pulmonary, breathing related problems. We know quitting is hard, and we are here to help.  Please let us know if there is anything we can do to help you meet your goal of quitting. If you are a former smoker, congratulations. We are proud of you! Remain smoke free! Remember you can refer friends or family members through the number above.  We will screen them to make sure they meet criteria for the program. Thank you for helping us take better care of you by  participating in Lung Screening.  You can receive free nicotine replacement therapy ( patches, gum or mints) by calling 1-800-QUIT NOW. Please call so we can get you on the path to becoming  a non-smoker. I know it is hard, but you can do this!  Lung RADS Categories:  Lung RADS 1: no nodules or definitely non-concerning nodules.  Recommendation is for a repeat annual scan in 12 months.  Lung RADS 2:  nodules that are non-concerning in appearance and behavior with a very low likelihood of becoming an active cancer. Recommendation is for a repeat annual scan in 12 months.  Lung RADS 3: nodules that are probably non-concerning , includes nodules with a low likelihood of becoming an active cancer.  Recommendation is for a 6-month repeat screening scan. Often noted after an upper respiratory illness. We will be in touch to make sure you have no questions, and to schedule your 6-month scan.  Lung RADS 4 A: nodules with concerning findings, recommendation is most often for a follow up scan in 3 months or additional testing based on our provider's assessment of the scan. We will be in touch to make sure you have no questions and to schedule the recommended 3 month follow up scan.  Lung RADS 4 B:  indicates findings that are concerning. We will be in touch with you to schedule additional diagnostic testing based on our provider's  assessment of the scan.  Other options for assistance in smoking cessation (   As covered by your insurance benefits)  Hypnosis for smoking cessation  [employer] [phone]  Acupuncture for smoking cessation  [facility] [phone]   

## 2022-07-31 ENCOUNTER — Ambulatory Visit
Admission: RE | Admit: 2022-07-31 | Discharge: 2022-07-31 | Disposition: A | Discharge status: Home / Self Care | Payer: Medicare HMO | Source: Ambulatory Visit | Attending: Acute Care | Admitting: Acute Care

## 2022-07-31 ENCOUNTER — Telehealth: Payer: Self-pay | Admitting: Pulmonary Disease

## 2022-07-31 DIAGNOSIS — Z87891 Personal history of nicotine dependence: Secondary | ICD-10-CM

## 2022-07-31 DIAGNOSIS — Z122 Encounter for screening for malignant neoplasm of respiratory organs: Secondary | ICD-10-CM

## 2022-08-04 NOTE — Telephone Encounter (Signed)
Call Report on [facility] CT completed on 07/31/22:   CLINICAL DATA:  Former smoker, quit 7 years ago, 30 pack-year history.   EXAM: CT CHEST WITHOUT CONTRAST LOW-DOSE FOR LUNG CANCER SCREENING   TECHNIQUE: Multidetector CT imaging of the chest was performed following the standard protocol without IV contrast.   RADIATION DOSE REDUCTION: This exam was performed according to the departmental dose-optimization program which includes automated exposure control, adjustment of the mA and/or kV according to patient size and/or use of iterative reconstruction technique.   COMPARISON:  07/26/2015.   FINDINGS: Cardiovascular: Atherosclerotic calcification of the aorta. Heart size normal. No pericardial effusion.   Mediastinum/Nodes: No pathologically enlarged mediastinal or axillary lymph nodes. Hilar regions are difficult to definitively evaluate without IV contrast. Esophagus is grossly unremarkable.   Lungs/Pleura: Biapical pleuroparenchymal scarring. Centrilobular and paraseptal emphysema. Scattered peribronchovascular nodularity. 8.5 mm inferior right lower lobe nodule (5/240). Scattered mucoid impaction. No additional worrisome pulmonary nodules. No pleural fluid. Airway is unremarkable.   Upper Abdomen: Visualized portions of the liver and gallbladder are unremarkable. 1.9 cm right adrenal nodule measures 8 Hounsfield units. No follow-up necessary. Visualized portions the left adrenal gland and right kidney are unremarkable. Hyperdense lesions in the left kidney measure up to 1.8 cm, incompletely visualized but also seen on 07/26/2015. No specific follow-up necessary. Visualized portions of the spleen, pancreas, stomach and bowel are grossly unremarkable. No upper abdominal adenopathy. Partially imaged IVC filter.   Musculoskeletal: Postoperative changes in the spine. No worrisome lytic or sclerotic lesions.   IMPRESSION: 1. 8.5 mm inferior right lower lobe nodule. Lung-RADS  4A, suspicious. Follow up low-dose chest CT without contrast in 3 months (please use the following order, "CT CHEST LCS NODULE FOLLOW-UP W/O CM") is recommended. Alternatively, PET may be considered when there is a solid component 64m or larger. These results will be called to the ordering clinician or representative by the Radiologist Assistant, and communication documented in the PACS or [employer] 2. Basilar peribronchovascular nodularity and scattered mucoid impaction, findings indicative of mycobacterium avium complex. 3. Right adrenal adenoma. 4.  Aortic atherosclerosis (ICD10-I70.0). 5.  Emphysema (ICD10-J43.9).  Sarah please advise

## 2022-08-05 ENCOUNTER — Telehealth: Payer: Self-pay | Admitting: Acute Care

## 2022-08-05 NOTE — Telephone Encounter (Signed)
I have called the patient with the results of her low-dose screening scan.  Her scan was read as a lung RADS 4A, suspicious.  She has a scattered peribronchovascular nodularity 8.5 mm in the inferior right lower lobe, and scattered mucoid impaction.  Plan is for 69-monthfollow-up low-dose screening CT to reevaluate this area.  I have reviewed the scan with Dr. GPatsey Bertholdwho agrees that patient has basilar peribronchovascular nodularities and scattered mucoid impaction that may be indicative of Mycobacterium Avium  complex.. We will reevaluate this finding on follow-up low-dose screening CT.  In 3 months. Denise please fax results to PCP and let them know plan is for a 328-monthollow-up low-dose screening CT to reevaluate the 8.5 mm right lower lobe nodule. Also let him know there is concern for Mycobacterium Avium complex.  This will be reevaluated on follow-up CT. Pulmonary consult may be indicated

## 2022-08-06 ENCOUNTER — Other Ambulatory Visit: Payer: Self-pay

## 2022-08-06 DIAGNOSIS — R911 Solitary pulmonary nodule: Secondary | ICD-10-CM

## 2022-08-06 DIAGNOSIS — Z87891 Personal history of nicotine dependence: Secondary | ICD-10-CM

## 2022-08-06 NOTE — Telephone Encounter (Signed)
Results/plan faxed to PCP and order placed for LDCT nodule follow up 3 months

## 2022-09-17 ENCOUNTER — Ambulatory Visit: Payer: Medicare HMO | Admitting: Orthopedic Surgery

## 2022-09-17 ENCOUNTER — Encounter: Payer: Self-pay | Admitting: Orthopedic Surgery

## 2022-09-17 ENCOUNTER — Encounter: Payer: Self-pay | Admitting: Internal Medicine

## 2022-09-17 ENCOUNTER — Ambulatory Visit (INDEPENDENT_AMBULATORY_CARE_PROVIDER_SITE_OTHER): Payer: Medicare HMO | Admitting: Internal Medicine

## 2022-09-17 VITALS — BP 110/68 | HR 95 | Temp 97.1°F | Ht 65.5 in | Wt 151.0 lb

## 2022-09-17 DIAGNOSIS — Z89612 Acquired absence of left leg above knee: Secondary | ICD-10-CM | POA: Diagnosis not present

## 2022-09-17 DIAGNOSIS — J329 Chronic sinusitis, unspecified: Secondary | ICD-10-CM | POA: Diagnosis not present

## 2022-09-17 MED ORDER — DOXYCYCLINE HYCLATE 100 MG PO TABS: 100.0000 mg | ORAL_TABLET | Freq: Two times a day (BID) | ORAL | 0 refills | Status: DC

## 2022-09-17 MED ORDER — BENZONATATE 200 MG PO CAPS: 200.0000 mg | ORAL_CAPSULE | Freq: Three times a day (TID) | ORAL | 0 refills | Status: DC | PRN
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
## 2022-09-17 NOTE — Assessment & Plan Note (Addendum)
Likely viral from duration, etc Discussed supportive care---analgesics Benzonatate prn for cough If worsens, fill doxy Rx
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
## 2022-09-29 DIAGNOSIS — Z8249 Family history of ischemic heart disease and other diseases of the circulatory system: Secondary | ICD-10-CM | POA: Diagnosis not present

## 2022-09-29 DIAGNOSIS — Z833 Family history of diabetes mellitus: Secondary | ICD-10-CM | POA: Diagnosis not present

## 2022-09-29 DIAGNOSIS — J449 Chronic obstructive pulmonary disease, unspecified: Secondary | ICD-10-CM | POA: Diagnosis not present

## 2022-09-29 DIAGNOSIS — Z809 Family history of malignant neoplasm, unspecified: Secondary | ICD-10-CM | POA: Diagnosis not present

## 2022-09-29 DIAGNOSIS — Z87891 Personal history of nicotine dependence: Secondary | ICD-10-CM | POA: Diagnosis not present

## 2022-09-29 DIAGNOSIS — Z89612 Acquired absence of left leg above knee: Secondary | ICD-10-CM | POA: Diagnosis not present

## 2022-09-29 DIAGNOSIS — R32 Unspecified urinary incontinence: Secondary | ICD-10-CM | POA: Diagnosis not present

## 2022-09-29 DIAGNOSIS — R030 Elevated blood-pressure reading, without diagnosis of hypertension: Secondary | ICD-10-CM | POA: Diagnosis not present

## 2022-09-29 DIAGNOSIS — Z008 Encounter for other general examination: Secondary | ICD-10-CM | POA: Diagnosis not present

## 2022-10-22 DIAGNOSIS — H5213 Myopia, bilateral: Secondary | ICD-10-CM | POA: Diagnosis not present

## 2022-10-22 DIAGNOSIS — H0288B Meibomian gland dysfunction left eye, upper and lower eyelids: Secondary | ICD-10-CM | POA: Diagnosis not present

## 2022-10-22 DIAGNOSIS — H04123 Dry eye syndrome of bilateral lacrimal glands: Secondary | ICD-10-CM | POA: Diagnosis not present

## 2022-10-22 DIAGNOSIS — H52213 Irregular astigmatism, bilateral: Secondary | ICD-10-CM | POA: Diagnosis not present

## 2022-10-22 DIAGNOSIS — H524 Presbyopia: Secondary | ICD-10-CM | POA: Diagnosis not present

## 2022-10-22 DIAGNOSIS — H0288A Meibomian gland dysfunction right eye, upper and lower eyelids: Secondary | ICD-10-CM | POA: Diagnosis not present

## 2022-10-28 ENCOUNTER — Encounter: Payer: Self-pay | Admitting: Family Medicine

## 2022-10-28 ENCOUNTER — Encounter: Payer: Self-pay | Admitting: Acute Care

## 2022-11-02 ENCOUNTER — Ambulatory Visit
Admission: RE | Admit: 2022-11-02 | Discharge: 2022-11-02 | Disposition: A | Discharge status: Home / Self Care | Payer: Medicare HMO | Source: Ambulatory Visit | Attending: Acute Care | Admitting: Acute Care

## 2022-11-02 DIAGNOSIS — R911 Solitary pulmonary nodule: Secondary | ICD-10-CM

## 2022-11-02 DIAGNOSIS — I700 Atherosclerosis of aorta: Secondary | ICD-10-CM | POA: Diagnosis not present

## 2022-11-02 DIAGNOSIS — Z87891 Personal history of nicotine dependence: Secondary | ICD-10-CM

## 2022-11-02 DIAGNOSIS — J439 Emphysema, unspecified: Secondary | ICD-10-CM | POA: Diagnosis not present

## 2022-11-03 ENCOUNTER — Telehealth: Payer: Self-pay | Admitting: Orthopedic Surgery

## 2022-11-03 ENCOUNTER — Telehealth: Payer: Self-pay | Admitting: Acute Care

## 2022-11-03 DIAGNOSIS — Z87891 Personal history of nicotine dependence: Secondary | ICD-10-CM

## 2022-11-03 DIAGNOSIS — R911 Solitary pulmonary nodule: Secondary | ICD-10-CM

## 2022-11-03 NOTE — Telephone Encounter (Signed)
Called and spoke to [facility] Rad regarding call report for LD CT chest. Impression below. Will send to Eric Form, NP, as Juluis Rainier. Thanks!   IMPRESSION: 1. Previously described solid nodule of the right lower lobe has resolved. However, there is a new subpleural solid nodule of the right lower lobe measuring 6.6 mm. Lung-RADS 4A, suspicious. Follow up low-dose chest CT without contrast in 3 months (please use the following order, CT CHEST LCS NODULE FOLLOW-UP W/O CM) is recommended. Alternatively, PET may be considered when there is a solid component 73m or larger. 2. Numerous clustered small solid nodules which are most pronounced in the lung bases, similar to prior and likely sequela of infection or aspiration. 3. Aortic Atherosclerosis (ICD10-I70.0) and Emphysema (ICD10-J43.9).   These results will be called to the ordering clinician or representative by the Radiologist Assistant, and communication documented in the PACS or [employer]     Electronically Signed   By: LYetta GlassmanM.D.   On: 11/03/2022 12:10

## 2022-11-03 NOTE — Telephone Encounter (Signed)
CALL REPORT

## 2022-11-03 NOTE — Telephone Encounter (Signed)
Pt called requesting [place] or [place] send to [employer] narrative and dignosis code for her wheelchair script Dr Sharol Given gave her. Pt states she took it to [employer] and they state Dr Sharol Given need to send those over so they can completer order for wheelchair for pt. Pt states she got an email that narrative and Diagnosis code can be sent. Please email to [email or web address]. Pt is asking for a call when they have been sent. Pt phone number is [phone]

## 2022-11-04 DIAGNOSIS — H524 Presbyopia: Secondary | ICD-10-CM | POA: Diagnosis not present

## 2022-11-04 NOTE — Telephone Encounter (Signed)
Information sent to the email listed below and asked that he reach out to me if there is any further documentation that the pt will need to process this request. The pt is a left above the knee amputation who currently is unstable with her prosthetic and requires the device to continue to complete her ADLS independently and safely.

## 2022-11-04 NOTE — Telephone Encounter (Signed)
Called and spoke with pt. Pt states that she had several days of sinus drainage and coughed up a small amount of clear phlegm but this went away after a few days. Pt denies having any difficulty swallowing or choking on food. I advised pt that we plan to repeat her CT in 3 months to follow up on new nodule that was seen. Results/ plan faxed to PCP order placed for 3 mth nodule f/u Ct.

## 2022-11-05 ENCOUNTER — Telehealth: Payer: Self-pay | Admitting: Orthopedic Surgery

## 2022-11-05 ENCOUNTER — Other Ambulatory Visit: Payer: Self-pay

## 2022-11-05 NOTE — Telephone Encounter (Signed)
Created letter in chart and also sent pt a mychart message with what was sent as well.

## 2022-11-05 NOTE — Telephone Encounter (Signed)
Pt called requesting diagnosis code and narrative be sent to her email. Pt states [employer] [facility] claims they have not received email with information. Please call pt about this matter at [phone].

## 2022-11-24 ENCOUNTER — Encounter: Payer: Self-pay | Admitting: Family Medicine

## 2022-11-24 ENCOUNTER — Ambulatory Visit (INDEPENDENT_AMBULATORY_CARE_PROVIDER_SITE_OTHER): Payer: Medicare HMO | Admitting: Family Medicine

## 2022-11-24 VITALS — BP 131/80 | HR 73 | Temp 97.6°F | Ht 65.5 in | Wt 152.2 lb

## 2022-11-24 DIAGNOSIS — J0110 Acute frontal sinusitis, unspecified: Secondary | ICD-10-CM

## 2022-11-24 DIAGNOSIS — J302 Other seasonal allergic rhinitis: Secondary | ICD-10-CM

## 2022-11-24 MED ORDER — PREDNISONE 20 MG PO TABS: 20.0000 mg | ORAL_TABLET | Freq: Every day | ORAL | 0 refills | Status: DC

## 2022-11-24 NOTE — Assessment & Plan Note (Signed)
Worse congestion since virus in January / was tx for bacterial sinusitis with doxycycline  Now L nostril is inflamed and sore (may be from flonase)  No colored mucous  Some sinus pressure/not pain  Suspect allergic or post viral   Adv: Continue zyrtec 10 mg daily  Hold flonase 3-4 wk Nasal saline prn  Vaporizer for bedroom  Tx with prednisone 20 mg daily for 7 d F/u with pcp in about 2 wk for re check  Disc s/s of bacterial sinusitis to watch for  ER precautions noted
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
## 2022-11-24 NOTE — Patient Instructions (Addendum)
Hold the flonase for at least 3-4 weeks  Use nasal saline if needed  Continue the generic zyrtec   Take prednisone 20 mg daily for 7 days  It will make you feel hyper and hungry    Watch for fever/ increased facial pain and pressure, colored nasal mucous  Follow up with Dr Darnell Level in about 2 weeks for a re check   Update if not starting to improve in a week or if worsening

## 2022-11-25 ENCOUNTER — Ambulatory Visit
Admission: RE | Admit: 2022-11-25 | Discharge: 2022-11-25 | Disposition: A | Discharge status: Home / Self Care | Payer: Medicare HMO | Source: Ambulatory Visit | Attending: Family Medicine | Admitting: Family Medicine

## 2022-11-25 DIAGNOSIS — M810 Age-related osteoporosis without current pathological fracture: Secondary | ICD-10-CM | POA: Diagnosis not present

## 2022-11-25 DIAGNOSIS — M8589 Other specified disorders of bone density and structure, multiple sites: Secondary | ICD-10-CM

## 2022-11-25 DIAGNOSIS — Z780 Asymptomatic menopausal state: Secondary | ICD-10-CM | POA: Diagnosis not present

## 2022-12-03 ENCOUNTER — Encounter: Payer: Self-pay | Admitting: Family Medicine

## 2022-12-23 ENCOUNTER — Encounter: Payer: Self-pay | Admitting: Family Medicine

## 2022-12-23 ENCOUNTER — Ambulatory Visit (INDEPENDENT_AMBULATORY_CARE_PROVIDER_SITE_OTHER): Payer: Medicare HMO | Admitting: Family Medicine

## 2022-12-23 VITALS — BP 128/86 | HR 75 | Temp 97.3°F | Ht 65.5 in | Wt 153.0 lb

## 2022-12-23 DIAGNOSIS — J439 Emphysema, unspecified: Secondary | ICD-10-CM | POA: Diagnosis not present

## 2022-12-23 DIAGNOSIS — J3089 Other allergic rhinitis: Secondary | ICD-10-CM | POA: Diagnosis not present

## 2022-12-23 DIAGNOSIS — M810 Age-related osteoporosis without current pathological fracture: Secondary | ICD-10-CM

## 2022-12-23 DIAGNOSIS — S78112A Complete traumatic amputation at level between left hip and knee, initial encounter: Secondary | ICD-10-CM

## 2022-12-23 DIAGNOSIS — J3489 Other specified disorders of nose and nasal sinuses: Secondary | ICD-10-CM | POA: Diagnosis not present

## 2022-12-23 DIAGNOSIS — R0981 Nasal congestion: Secondary | ICD-10-CM | POA: Diagnosis not present

## 2022-12-23 DIAGNOSIS — S78112D Complete traumatic amputation at level between left hip and knee, subsequent encounter: Secondary | ICD-10-CM | POA: Diagnosis not present

## 2022-12-23 NOTE — Progress Notes (Unsigned)
Ph: [phone]       Fax: [phone]   Patient ID: Rachel Mcintosh, female    DOB: 08/20/1956, 67 y.o.   MRN: [id number]  This visit was conducted in person.  BP 128/86   Pulse 75   Temp (!) 97.3 F (36.3 C) (Temporal)   Ht 5' 5.5" (1.664 m)   Wt 153 lb (69.4 kg)   SpO2 97%   BMI 25.07 kg/m    CC: f/u sinusitis  Subjective:   HPI: [name] is a 67 y.o. female presenting on 12/23/2022 for Sinusitis (Here for 2 wk f/u, per Dr. Milinda Antis. )   COPD/emphysema - 05/2022 started umeclidinium (Incruse Ellipta) - no noted benefit so she stopped.   Saw Dr Alphonsus Sias 08/2022 with sinusitis treated with supportive measures followed by doxycycline course with some benefit.   Saw Dr Milinda Antis 1 month ago with ongoing allergic rhinitis related congestion - rec zyrtec  daily, hold flonase and instead take prednisone  burst, continue nasal saline rinse.   Chronic nasal congestion intermittent for years - seems perennial.  Has intermittent bilateral sinus discomfort. Occ PNDrainage.  She notes left side of nose stays obstructed. L nasal passage remains raw.  She noted change in smell since 07/2022 - heater smells like a chemical. Can also smell strange smells randomly.  Nasal saline causes burning.   She is now living in mother's old house.  She continues zyrtec  daily.   No recent fevers/chills, ST.   She did have fall 2013 landed on her nose but doesn't remember breaking nose.   She had panoramic dental xray - dentist recommend ENT eval.  Requests [facility] eval.   OP - DEXA 10/2022 - T -4.9 R L total femur  Now taking calcium /vit D 400 IU daily.  Continues weight bearing exercise.  Not currently interested in bone strengthening medicine but would like literature to review.      Relevant past medical, surgical, family and social history reviewed and updated as indicated. Interim medical history since our last visit reviewed. Allergies and medications reviewed  and updated. Outpatient Medications Prior to Visit  Medication Sig Dispense Refill   albuterol (VENTOLIN HFA) 108 (90 Base) MCG/ACT inhaler Inhale 2 puffs into the lungs every 6 (six) hours as needed for wheezing or shortness of breath. 18 g 0   ALPRAZolam (XANAX) 1 MG tablet TAKE 1 TABLET BY MOUTH TWICE A DAY AS NEEDED FOR ANXIETY 30 tablet 0   Calcium Carbonate-Vitamin D3 (CALCIUM 600/VITAMIN D) 600-400 MG-UNIT TABS Take 1 tablet by mouth daily.     cetirizine (ZYRTEC) 10 MG tablet Take 10 mg by mouth daily.     famotidine (PEPCID) 20 MG tablet Take 20 mg by mouth daily as needed for heartburn or indigestion.     fluticasone (FLONASE) 50 MCG/ACT nasal spray Place 2 sprays into both nostrils daily. 16 g 6   vitamin B-12 (V-R VITAMIN B-12) 500 MCG tablet Take 1 tablet (500 mcg total) by mouth 3 (three) times a week.     predniSONE (DELTASONE) 20 MG tablet Take 1 tablet (20 mg total) by mouth daily with breakfast. 7 tablet 0   No facility-administered medications prior to visit.     Per HPI unless specifically indicated in ROS section below Review of Systems  Objective:  BP 128/86   Pulse 75   Temp (!) 97.3 F (36.3 C) (Temporal)   Ht 5' 5.5" (1.664 m)   Wt 153 lb (69.4  kg)   SpO2 97%   BMI 25.07 kg/m   Wt Readings from Last 3 Encounters:  12/23/22 153 lb (69.4 kg)  11/24/22 152 lb 4 oz (69.1 kg)  09/17/22 151 lb (68.5 kg)      Physical Exam Vitals and nursing note reviewed.  Constitutional:      Appearance: Normal appearance. She is not ill-appearing.  HENT:     Head: Normocephalic and atraumatic.     Right Ear: Tympanic membrane, ear canal and external ear normal. There is no impacted cerumen.     Left Ear: Tympanic membrane, ear canal and external ear normal. There is no impacted cerumen.     Nose: Congestion present. No mucosal edema or rhinorrhea.     Right Nostril: No occlusion.     Left Nostril: Occlusion present.     Right Turbinates: Swollen. Not pale.     Left  Turbinates: Swollen. Not pale.     Right Sinus: Maxillary sinus tenderness (mild) and frontal sinus tenderness (mild) present.     Left Sinus: Maxillary sinus tenderness (mild) and frontal sinus tenderness (mild) present.     Comments: Crowded left nasal passage with enlarged turbinates L>R    Mouth/Throat:     Mouth: Mucous membranes are moist.     Pharynx: Oropharynx is clear. No oropharyngeal exudate or posterior oropharyngeal erythema.  Eyes:     Extraocular Movements: Extraocular movements intact.     Conjunctiva/sclera: Conjunctivae normal.     Pupils: Pupils are equal, round, and reactive to light.  Cardiovascular:     Rate and Rhythm: Normal rate and regular rhythm.     Pulses: Normal pulses.     Heart sounds: Normal heart sounds. No murmur heard. Pulmonary:     Effort: Pulmonary effort is normal. No respiratory distress.     Breath sounds: Normal breath sounds. No wheezing, rhonchi or rales.  Musculoskeletal:     Comments: L AKA amputation  Lymphadenopathy:     Head:     Right side of head: No submental, submandibular, tonsillar, preauricular or posterior auricular adenopathy.     Left side of head: No submental, submandibular, tonsillar, preauricular or posterior auricular adenopathy.     Cervical: No cervical adenopathy.     Right cervical: No superficial cervical adenopathy.    Left cervical: No superficial cervical adenopathy.     Upper Body:     Right upper body: No supraclavicular adenopathy.     Left upper body: No supraclavicular adenopathy.  Skin:    Findings: No rash.  Neurological:     Mental Status: She is alert.  Psychiatric:        Mood and Affect: Mood normal.        Behavior: Behavior normal.       Lab Results  Component Value Date   CHOL 261 (H) 06/08/2022   HDL 90.00 06/08/2022   LDLCALC 145 (H) 06/08/2022   TRIG 134.0 06/08/2022   CHOLHDL 3 06/08/2022   Lab Results  Component Value Date   VD25OH 32.48 06/08/2022   Assessment & Plan:    Problem List Items Addressed This Visit   None    No orders of the defined types were placed in this encounter.   No orders of the defined types were placed in this encounter.   There are no Patient Instructions on file for this visit.  Follow up plan: No follow-ups on file.  Eustaquio Boyden, MD

## 2022-12-23 NOTE — Patient Instructions (Addendum)
We will refer you to ENT for further evaluation of chronic left sided nasal congestion/obstruction.  Look into osteoporosis treatment options - handouts provided today.  Good to see you today

## 2022-12-24 ENCOUNTER — Encounter: Payer: Self-pay | Admitting: *Deleted

## 2022-12-24 DIAGNOSIS — R0981 Nasal congestion: Secondary | ICD-10-CM | POA: Insufficient documentation

## 2022-12-24 DIAGNOSIS — Z89612 Acquired absence of left leg above knee: Secondary | ICD-10-CM | POA: Diagnosis not present

## 2022-12-24 NOTE — Assessment & Plan Note (Signed)
Noted on imaging - trial Incruse Ellipta (umeclidinium) without significant benefit - now off this.

## 2022-12-24 NOTE — Assessment & Plan Note (Signed)
Chronic issue - managed currently with zyrtec  daily and flonase daily. Notes burning discomfort with nasal saline irrigation. See below.
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
## 2022-12-24 NOTE — Assessment & Plan Note (Signed)
Reviewed recent DEXA results showing severe osteoporosis with T score -4.9 at L femur.  Encouraged regular weight bearing exercise, calcium vit D daily.  Discussed bone strengthening medication - she declines at this time but would like info - Fosamax and Prolia handouts provided today.

## 2022-12-31 ENCOUNTER — Telehealth: Payer: Self-pay | Admitting: Acute Care

## 2022-12-31 NOTE — Telephone Encounter (Signed)
I have called the patient and discussed that she wants to defer on the 3 month follow up of her 4A scan. Her insurance cost is $120, and she is on disability, and this is a lot of money for her. I told her I had Dr. Delton Coombes review the scan and he agrees with a 3 month follow up, prefers 3 months but said he did not want her to wait until 10/2023 when her next annual scan is due to check this area again.  We recommend 3 month follow up, but if she can save an get a 6 month follow up scan it is better than waiting a full year. She is in agreement with this plan. Angelique Blonder, please place an order for 6 months after the march scan . She understands this is not our recommendation, but is willing to do this instead of waiting a full year.

## 2022-12-31 NOTE — Telephone Encounter (Signed)
Updated CT order to be rescheduled for 05/2023. [facility] has been updated.

## 2023-01-01 DIAGNOSIS — R431 Parosmia: Secondary | ICD-10-CM | POA: Diagnosis not present

## 2023-01-26 DIAGNOSIS — R431 Parosmia: Secondary | ICD-10-CM | POA: Diagnosis not present

## 2023-02-02 ENCOUNTER — Other Ambulatory Visit: Payer: Self-pay | Admitting: Family Medicine

## 2023-02-02 DIAGNOSIS — Z1231 Encounter for screening mammogram for malignant neoplasm of breast: Secondary | ICD-10-CM

## 2023-02-03 ENCOUNTER — Other Ambulatory Visit: Payer: Medicare HMO

## 2023-02-08 ENCOUNTER — Other Ambulatory Visit: Payer: Medicare HMO

## 2023-05-06 ENCOUNTER — Ambulatory Visit
Admission: RE | Admit: 2023-05-06 | Discharge: 2023-05-06 | Disposition: A | Discharge status: Home / Self Care | Payer: Medicare HMO | Source: Ambulatory Visit | Attending: Acute Care | Admitting: Acute Care

## 2023-05-06 ENCOUNTER — Ambulatory Visit (INDEPENDENT_AMBULATORY_CARE_PROVIDER_SITE_OTHER): Payer: Medicare HMO | Admitting: Internal Medicine

## 2023-05-06 VITALS — BP 124/82 | HR 77 | Temp 97.2°F | Ht 65.5 in | Wt 157.0 lb

## 2023-05-06 DIAGNOSIS — Z87891 Personal history of nicotine dependence: Secondary | ICD-10-CM

## 2023-05-06 DIAGNOSIS — R911 Solitary pulmonary nodule: Secondary | ICD-10-CM

## 2023-05-06 DIAGNOSIS — J441 Chronic obstructive pulmonary disease with (acute) exacerbation: Secondary | ICD-10-CM | POA: Diagnosis not present

## 2023-05-06 MED ORDER — PREDNISONE 20 MG PO TABS
40.0000 mg | ORAL_TABLET | Freq: Every day | ORAL | 0 refills | Status: DC
Start: 2023-05-06 — End: 2023-06-18

## 2023-05-06 MED ORDER — DOXYCYCLINE HYCLATE 100 MG PO TABS
100.0000 mg | ORAL_TABLET | Freq: Two times a day (BID) | ORAL | 0 refills | Status: DC
Start: 2023-05-06 — End: 2023-06-18
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
## 2023-05-06 NOTE — Assessment & Plan Note (Signed)
Mild COPD since stopped smoking--mostly with respiratory infections (likely starts as viral) Will give the doxy 100 bid x 7 days Prednisone 40 x 3, 20 x 3

## 2023-05-10 ENCOUNTER — Ambulatory Visit: Payer: Medicare HMO | Admitting: Family Medicine

## 2023-06-06 ENCOUNTER — Other Ambulatory Visit: Payer: Self-pay | Admitting: Family Medicine

## 2023-06-06 DIAGNOSIS — E538 Deficiency of other specified B group vitamins: Secondary | ICD-10-CM

## 2023-06-06 DIAGNOSIS — M810 Age-related osteoporosis without current pathological fracture: Secondary | ICD-10-CM

## 2023-06-06 DIAGNOSIS — E7800 Pure hypercholesterolemia, unspecified: Secondary | ICD-10-CM

## 2023-06-11 ENCOUNTER — Other Ambulatory Visit (INDEPENDENT_AMBULATORY_CARE_PROVIDER_SITE_OTHER): Payer: Medicare HMO

## 2023-06-11 DIAGNOSIS — E538 Deficiency of other specified B group vitamins: Secondary | ICD-10-CM | POA: Diagnosis not present

## 2023-06-11 DIAGNOSIS — E7800 Pure hypercholesterolemia, unspecified: Secondary | ICD-10-CM

## 2023-06-11 DIAGNOSIS — M810 Age-related osteoporosis without current pathological fracture: Secondary | ICD-10-CM

## 2023-06-11 LAB — VITAMIN B12: Vitamin B-12: 510 pg/mL (ref 211–911)

## 2023-06-11 LAB — COMPREHENSIVE METABOLIC PANEL
ALT: 12 U/L (ref 0–35)
AST: 14 U/L (ref 0–37)
Albumin: 4.4 g/dL (ref 3.5–5.2)
Alkaline Phosphatase: 61 U/L (ref 39–117)
BUN: 9 mg/dL (ref 6–23)
CO2: 28 meq/L (ref 19–32)
Calcium: 9.7 mg/dL (ref 8.4–10.5)
Chloride: 104 meq/L (ref 96–112)
Creatinine, Ser: 0.65 mg/dL (ref 0.40–1.20)
GFR: 92.72 mL/min (ref >60.00)
Glucose, Bld: 111 mg/dL — ABNORMAL HIGH (ref 70–99)
Potassium: 3.8 meq/L (ref 3.5–5.1)
Sodium: 140 meq/L (ref 135–145)
Total Bilirubin: 0.8 mg/dL (ref 0.2–1.2)
Total Protein: 6.5 g/dL (ref 6.0–8.3)

## 2023-06-11 LAB — LIPID PANEL
Cholesterol: 258 mg/dL — ABNORMAL HIGH (ref 0–200)
HDL: 66.5 mg/dL (ref >39.00)
LDL Cholesterol: 167 mg/dL — ABNORMAL HIGH (ref 0–99)
NonHDL: 191.45
Total CHOL/HDL Ratio: 4
Triglycerides: 120 mg/dL (ref 0.0–149.0)
VLDL: 24 mg/dL (ref 0.0–40.0)

## 2023-06-11 LAB — VITAMIN D 25 HYDROXY (VIT D DEFICIENCY, FRACTURES): VITD: 27.43 ng/mL — ABNORMAL LOW (ref 30.00–100.00)

## 2023-06-18 ENCOUNTER — Ambulatory Visit: Payer: Medicare HMO | Attending: Family Medicine

## 2023-06-18 ENCOUNTER — Ambulatory Visit: Payer: Medicare HMO | Admitting: Family Medicine

## 2023-06-18 ENCOUNTER — Encounter: Payer: Self-pay | Admitting: Family Medicine

## 2023-06-18 VITALS — BP 120/76 | HR 75 | Temp 99.2°F | Ht 65.5 in | Wt 157.0 lb

## 2023-06-18 DIAGNOSIS — Z0000 Encounter for general adult medical examination without abnormal findings: Secondary | ICD-10-CM | POA: Diagnosis not present

## 2023-06-18 DIAGNOSIS — M810 Age-related osteoporosis without current pathological fracture: Secondary | ICD-10-CM

## 2023-06-18 DIAGNOSIS — S78112A Complete traumatic amputation at level between left hip and knee, initial encounter: Secondary | ICD-10-CM

## 2023-06-18 DIAGNOSIS — E7800 Pure hypercholesterolemia, unspecified: Secondary | ICD-10-CM

## 2023-06-18 DIAGNOSIS — K219 Gastro-esophageal reflux disease without esophagitis: Secondary | ICD-10-CM | POA: Diagnosis not present

## 2023-06-18 DIAGNOSIS — R002 Palpitations: Secondary | ICD-10-CM | POA: Diagnosis not present

## 2023-06-18 DIAGNOSIS — N393 Stress incontinence (female) (male): Secondary | ICD-10-CM | POA: Diagnosis not present

## 2023-06-18 DIAGNOSIS — I700 Atherosclerosis of aorta: Secondary | ICD-10-CM

## 2023-06-18 DIAGNOSIS — J439 Emphysema, unspecified: Secondary | ICD-10-CM | POA: Diagnosis not present

## 2023-06-18 DIAGNOSIS — R0981 Nasal congestion: Secondary | ICD-10-CM

## 2023-06-18 DIAGNOSIS — Z95828 Presence of other vascular implants and grafts: Secondary | ICD-10-CM

## 2023-06-18 DIAGNOSIS — B001 Herpesviral vesicular dermatitis: Secondary | ICD-10-CM | POA: Diagnosis not present

## 2023-06-18 DIAGNOSIS — E538 Deficiency of other specified B group vitamins: Secondary | ICD-10-CM

## 2023-06-18 DIAGNOSIS — Z7189 Other specified counseling: Secondary | ICD-10-CM

## 2023-06-18 MED ORDER — ALENDRONATE SODIUM 70 MG PO TABS: 70.0000 mg | ORAL_TABLET | ORAL | 11 refills | Status: DC

## 2023-06-18 MED ORDER — VALACYCLOVIR HCL 1 G PO TABS: 2000.0000 mg | ORAL_TABLET | Freq: Two times a day (BID) | ORAL | 0 refills | Status: DC

## 2023-06-18 MED ORDER — VITAMIN D3 25 MCG (1000 UT) PO CAPS: 1.0000 | ORAL_CAPSULE | Freq: Every day | ORAL | Status: AC

## 2023-06-18 NOTE — Assessment & Plan Note (Signed)
Chronic, stable on as needed Pepcid.

## 2023-06-18 NOTE — Assessment & Plan Note (Signed)
Previously discussed.

## 2023-06-18 NOTE — Assessment & Plan Note (Signed)
Describes intermittent tachycardia palpitation episodes not associated with exertion, without related syncope, can happen about once a week lasting minutes at a time. Will order 2-week event monitor-Zio patch for patient to wear.

## 2023-06-18 NOTE — Assessment & Plan Note (Signed)
Status post reassuring ENT evaluation including maxillofacial CT scan

## 2023-06-18 NOTE — Assessment & Plan Note (Signed)
Chronic, noted on imaging. On controller medication-did not feel Incruse Ellipta was beneficial. Managed with as needed albuterol rescue inhaler, infrequent use.

## 2023-06-18 NOTE — Assessment & Plan Note (Signed)
Continue oral B12 replacement 3 times a week 500 mcg tablets.

## 2023-06-18 NOTE — Assessment & Plan Note (Signed)
She is not on aspirin or statin.

## 2023-06-18 NOTE — Assessment & Plan Note (Addendum)
Again reviewed recent DEXA earlier this year with T-score of -4.9 to left total femur. Previously discussed treatment options including Fosamax and Prolia, as well as daily recommended calcium and vitamin D intake. She is interested in trying Fosamax.  Discussed risk of atypical hip fractures, to let dentist know of bisphosphonate use. Start Fosamax tablets once weekly, discussing optimal administration technique.  Discussed monitoring for GI symptoms and history of previous dilated Schatzki ring and 1 cm hiatal hernia (2022)

## 2023-06-18 NOTE — Assessment & Plan Note (Addendum)
Chronic, deteriorated control off medication.  Reviewed diet choices to improve cholesterol levels.  No family history of CAD/CVA. The 10-year ASCVD risk score (Arnett DK, et al., 2019) is: 4.7%   Values used to calculate the score:     Age: 67 years     Sex: Female     Is Non-Hispanic African American: No     Diabetic: No     Tobacco smoker: No     Systolic Blood Pressure: 120 mmHg     Is BP treated: No     HDL Cholesterol: 66.5 mg/dL     Total Cholesterol: 258 mg/dL

## 2023-06-18 NOTE — Assessment & Plan Note (Signed)
Mild, aware of Kegel exercises.

## 2023-06-18 NOTE — Assessment & Plan Note (Signed)

## 2023-06-18 NOTE — Assessment & Plan Note (Signed)
Preventative protocols reviewed and updated unless pt declined. Discussed healthy diet and lifestyle.  

## 2023-06-18 NOTE — Assessment & Plan Note (Signed)
Currently with acute flare, discussed treatment options. Will treat with Valtrex 2 g twice daily x 1 day per flare.
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
## 2023-06-18 NOTE — Patient Instructions (Addendum)
Start fosamax tablet once weekly - in am on empty stomach with large glass of water. Same day every week. Let dentist know you've started fosamax.  Start vit D 1000 units daily in addition to calcium supplement.  We will order Zio patch heart monitor.  May use valtrex as needed for fever blister.  Good to see you today Return as needed or in 1 year for next wellness visit/physical.

## 2023-06-23 ENCOUNTER — Telehealth: Payer: Self-pay | Admitting: Acute Care

## 2023-06-23 DIAGNOSIS — Z87891 Personal history of nicotine dependence: Secondary | ICD-10-CM

## 2023-06-23 DIAGNOSIS — R911 Solitary pulmonary nodule: Secondary | ICD-10-CM

## 2023-06-23 NOTE — Telephone Encounter (Signed)
Patient had LDCT on 05/06/2023 that resulted as a Lung-RADS 3 with the radiologist recommending a 6 month follow up scan. Patient had a previous scan in 10/2022. There is a new nodule in RLL at 4.48mm in size in addition to several small clustered nodules, stable. After review by Kandice Robinsons NP she agrees to a 6 month follow up scan to re-assess.

## 2023-06-23 NOTE — Telephone Encounter (Signed)
Spoke with pt and advised of CT results and plan to repeat LDCT in 6 months. Pt verbalized understanding. Nodule f/u CT order placed for 6 months.

## 2023-06-24 DIAGNOSIS — R002 Palpitations: Secondary | ICD-10-CM

## 2023-07-12 ENCOUNTER — Ambulatory Visit
Admission: RE | Admit: 2023-07-12 | Discharge: 2023-07-12 | Disposition: A | Discharge status: Home / Self Care | Payer: Medicare HMO | Source: Ambulatory Visit | Attending: Family Medicine | Admitting: Family Medicine

## 2023-07-12 DIAGNOSIS — Z1231 Encounter for screening mammogram for malignant neoplasm of breast: Secondary | ICD-10-CM | POA: Diagnosis not present

## 2023-07-13 DIAGNOSIS — R002 Palpitations: Secondary | ICD-10-CM | POA: Diagnosis not present

## 2023-08-26 ENCOUNTER — Other Ambulatory Visit: Payer: Self-pay | Admitting: Family Medicine

## 2023-08-26 DIAGNOSIS — B001 Herpesviral vesicular dermatitis: Secondary | ICD-10-CM

## 2023-08-26 NOTE — Telephone Encounter (Signed)
Valtrex Last filled:  06/18/23, #20 Last OV:  06/18/23, AWV Next OV:  06/20/24, CPE

## 2023-08-29 NOTE — Telephone Encounter (Signed)
ERx 

## 2023-10-25 ENCOUNTER — Ambulatory Visit (INDEPENDENT_AMBULATORY_CARE_PROVIDER_SITE_OTHER): Payer: Medicare HMO | Admitting: Internal Medicine

## 2023-10-25 ENCOUNTER — Encounter: Payer: Self-pay | Admitting: Internal Medicine

## 2023-10-25 VITALS — BP 114/78 | HR 72 | Temp 98.3°F | Ht 65.5 in | Wt 156.0 lb

## 2023-10-25 DIAGNOSIS — H524 Presbyopia: Secondary | ICD-10-CM | POA: Diagnosis not present

## 2023-10-25 DIAGNOSIS — H5213 Myopia, bilateral: Secondary | ICD-10-CM | POA: Diagnosis not present

## 2023-10-25 DIAGNOSIS — I889 Nonspecific lymphadenitis, unspecified: Secondary | ICD-10-CM | POA: Insufficient documentation

## 2023-10-25 DIAGNOSIS — H0288B Meibomian gland dysfunction left eye, upper and lower eyelids: Secondary | ICD-10-CM | POA: Diagnosis not present

## 2023-10-25 DIAGNOSIS — H0288A Meibomian gland dysfunction right eye, upper and lower eyelids: Secondary | ICD-10-CM | POA: Diagnosis not present

## 2023-10-25 DIAGNOSIS — H04123 Dry eye syndrome of bilateral lacrimal glands: Secondary | ICD-10-CM | POA: Diagnosis not present

## 2023-10-25 DIAGNOSIS — H52213 Irregular astigmatism, bilateral: Secondary | ICD-10-CM | POA: Diagnosis not present

## 2023-10-25 MED ORDER — CEPHALEXIN 500 MG PO CAPS: 500.0000 mg | ORAL_CAPSULE | Freq: Three times a day (TID) | ORAL | 0 refills | Status: DC
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
## 2023-10-25 NOTE — Assessment & Plan Note (Signed)
 With dysphagia Goes back some weeks Will try empiric Rx with cephalexin (didn't tolerate augmentin) 500 tid x 7 days  If ongoing symptoms, will set up with ENT for direct laryngoscopy

## 2023-11-05 ENCOUNTER — Ambulatory Visit: Payer: Self-pay | Admitting: Family Medicine

## 2023-11-05 ENCOUNTER — Ambulatory Visit
Admission: RE | Admit: 2023-11-05 | Discharge: 2023-11-05 | Disposition: A | Discharge status: Home / Self Care | Source: Ambulatory Visit | Attending: Nurse Practitioner | Admitting: Nurse Practitioner

## 2023-11-05 ENCOUNTER — Other Ambulatory Visit: Payer: Self-pay

## 2023-11-05 VITALS — BP 160/84 | HR 100 | Temp 98.3°F | Resp 16

## 2023-11-05 DIAGNOSIS — R300 Dysuria: Secondary | ICD-10-CM

## 2023-11-05 DIAGNOSIS — R82998 Other abnormal findings in urine: Secondary | ICD-10-CM

## 2023-11-05 LAB — POCT URINALYSIS DIP (MANUAL ENTRY)
Bilirubin, UA: NEGATIVE
Glucose, UA: NEGATIVE mg/dL
Ketones, POC UA: NEGATIVE mg/dL
Nitrite, UA: POSITIVE — AB
Protein Ur, POC: NEGATIVE mg/dL
Spec Grav, UA: 1.015
Urobilinogen, UA: 0.2 U/dL
pH, UA: 6.5

## 2023-11-05 NOTE — Discharge Instructions (Addendum)
 Your urinalysis is positive for trace leukocytes and positive nitrates.The nitrates are from the AZO. A Urine culture pending this confirms if there is growth and what type for best treatment.  Encourage increasing hydration with water.continue AZO or Add cranberry juice 100% 8 -16 ozs daily until symptoms improve.

## 2023-11-05 NOTE — Telephone Encounter (Signed)
 Copied From CRM [id number]. Reason for Triage: Patient thinks she has UTI, experiencing pressure in her lower abdomen, having pain while urinating.   Attempted to call patient- message: Call can not be completed as dialed x3  Unable to leave call back message

## 2023-11-05 NOTE — ED Triage Notes (Addendum)
 Pt reports dysuria and increased frequency that started this morning. Burning sensation. Denies hematuria, N/V, and foul odor. Pt has already taken AZO for symptoms with some relief. Pt has past recurrent hx of interstitial cystitis.   While in triage, pt reports fluttering sensation in chest. States she was on a cardiac monitor due to palpitations but was removed in Nov 2024. BP elevated in triage. Denies dizziness and SOB.
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
## 2023-11-05 NOTE — Telephone Encounter (Signed)
 Second attempt to call patient- same message on answer- unable to leave call back message

## 2023-11-05 NOTE — Telephone Encounter (Signed)
 Chief Complaint: Burning with urination, pelvic pain Symptoms: see above Frequency: today Pertinent Negatives: Patient denies fever, blood in urine Disposition: [] ED /[x] Urgent Care (no appt availability in office) / [x] Appointment(In office/virtual)/ []  [facility]/ [] Home Care/ [] Refused Recommended Disposition /[] [facility]/ []  Follow-up with PCP Additional Notes: Patient requesting appt for today to be evaluated for UTI, stating she is having 8/10 pain with urination and pelvic pain. No appt availability with [facility] PCP office's today. Referred patient to UC for today.    Reason for Disposition  Side (flank) or lower back pain present    Burning with urination  Answer Assessment - Initial Assessment Questions 1. SYMPTOM: "What's the main symptom you're concerned about?" (e.g., frequency, incontinence)     Pelvic pressure, pain when urinating 2. ONSET: "When did the  symptoms  start?"     This morning 3. PAIN: "Is there any pain?" If Yes, ask: "How bad is it?" (Scale: 1-10; mild, moderate, severe)     8-9 4. CAUSE: "What do you think is causing the symptoms?"     Iterstitial cystitis or UTI 5. OTHER SYMPTOMS: "Do you have any other symptoms?" (e.g., blood in urine, fever, flank pain, pain with urination)     No  Protocols used: Urinary Symptoms-A-AH

## 2023-11-08 ENCOUNTER — Encounter: Payer: Self-pay | Admitting: Family Medicine

## 2023-11-08 ENCOUNTER — Ambulatory Visit (INDEPENDENT_AMBULATORY_CARE_PROVIDER_SITE_OTHER): Admitting: Family Medicine

## 2023-11-08 VITALS — BP 144/78 | HR 81 | Temp 97.8°F | Ht 65.5 in | Wt 158.2 lb

## 2023-11-08 DIAGNOSIS — R300 Dysuria: Secondary | ICD-10-CM | POA: Diagnosis not present

## 2023-11-08 DIAGNOSIS — R1033 Periumbilical pain: Secondary | ICD-10-CM | POA: Diagnosis not present

## 2023-11-08 DIAGNOSIS — I10 Essential (primary) hypertension: Secondary | ICD-10-CM | POA: Insufficient documentation

## 2023-11-08 DIAGNOSIS — R030 Elevated blood-pressure reading, without diagnosis of hypertension: Secondary | ICD-10-CM | POA: Insufficient documentation

## 2023-11-08 DIAGNOSIS — R002 Palpitations: Secondary | ICD-10-CM

## 2023-11-08 DIAGNOSIS — S78112A Complete traumatic amputation at level between left hip and knee, initial encounter: Secondary | ICD-10-CM

## 2023-11-08 LAB — POC URINALSYSI DIPSTICK (AUTOMATED)
Glucose, UA: NEGATIVE
Nitrite, UA: POSITIVE
Protein, UA: POSITIVE — AB
Spec Grav, UA: >=1.03 — AB (ref 1.010–1.025)
Urobilinogen, UA: >=8 U/dL — AB
pH, UA: 5 (ref 5.0–8.0)

## 2023-11-08 MED ORDER — CEFDINIR 300 MG PO CAPS
300.0000 mg | ORAL_CAPSULE | Freq: Two times a day (BID) | ORAL | 0 refills | Status: DC
Start: 2023-11-08 — End: 2023-11-29

## 2023-11-08 NOTE — Assessment & Plan Note (Addendum)
 Ongoing symptoms including dysuria, urgency, frequency, suprapubic discomfort, with abnormal UA.  Recently completed keflex course for cervical lymphadenitis.  Start omnicef 300mg  BID x 7d course.  UCx sent.

## 2023-11-08 NOTE — Assessment & Plan Note (Signed)
 Elevated readings this past week, in setting of malaise from presumed UTI.  Did not start medication today.  BP log sheet provided with instructions to check a few times a week to daily, update [place] if persistently >140/90.  Discussed limiting salt/sodium in diet, continue good water intake.

## 2023-11-08 NOTE — Patient Instructions (Addendum)
 Urine culture sent today. Start omnicef antibiotic twice daily for 1 week. Let us know if ongoing symptoms after completing treatment.   BP was too high today. Start monitoring BP at home.  Your goal blood pressure is <140/90.  Work on low salt/sodium diet - goal <2 grams (2,000mg ) per day. Eat a diet high in fruits/vegetables and whole grains.  Look into mediterranean and DASH diet. Goal activity is 159min/wk of moderate intensity exercise.  This can be split into 30 minute chunks.  If you are not at this level, you can start with smaller 10-15 min increments and slowly build up activity. Look at www.heart.org for more resources.
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
## 2023-11-09 ENCOUNTER — Ambulatory Visit
Admission: RE | Admit: 2023-11-09 | Discharge: 2023-11-09 | Disposition: A | Discharge status: Home / Self Care | Payer: Medicare HMO | Source: Ambulatory Visit | Attending: Acute Care | Admitting: Acute Care

## 2023-11-09 DIAGNOSIS — R911 Solitary pulmonary nodule: Secondary | ICD-10-CM

## 2023-11-09 DIAGNOSIS — Z87891 Personal history of nicotine dependence: Secondary | ICD-10-CM

## 2023-11-09 DIAGNOSIS — J439 Emphysema, unspecified: Secondary | ICD-10-CM | POA: Diagnosis not present

## 2023-11-09 DIAGNOSIS — I700 Atherosclerosis of aorta: Secondary | ICD-10-CM | POA: Diagnosis not present
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
## 2023-11-09 NOTE — Assessment & Plan Note (Addendum)
 Noted upper to periumbilical discomfort with palpation - ?ventral hernia. No obvious hernia on exam. Will monitor. Low threshold to check imaging if ongoing, worsening.  H/o aortic ath.  No pulsatile mass/bruit.

## 2023-11-09 NOTE — Assessment & Plan Note (Addendum)
 Prior heart monitor showed nonsustained SVT, occ PVCs.  Consider BB medication.

## 2023-11-10 LAB — URINE CULTURE
MICRO NUMBER:: 16180632
SPECIMEN QUALITY:: ADEQUATE

## 2023-11-11 ENCOUNTER — Encounter: Payer: Self-pay | Admitting: Family Medicine

## 2023-11-12 ENCOUNTER — Encounter: Payer: Self-pay | Admitting: Family Medicine

## 2023-11-12 DIAGNOSIS — R0602 Shortness of breath: Secondary | ICD-10-CM

## 2023-11-12 MED ORDER — ALBUTEROL SULFATE HFA 108 (90 BASE) MCG/ACT IN AERS: 2.0000 | INHALATION_SPRAY | Freq: Four times a day (QID) | RESPIRATORY_TRACT | 3 refills | Status: DC | PRN

## 2023-11-12 MED ORDER — PREDNISONE 20 MG PO TABS: 40.0000 mg | ORAL_TABLET | Freq: Every day | ORAL | 0 refills | Status: DC

## 2023-11-12 NOTE — Telephone Encounter (Signed)
 Spoke with patient.  Not wheezing.  Coughing, overall dry.  H/o COPD.  Will Rx prednisone burst.  She will finish omnicef course.  Update if not improving with this.

## 2023-11-22 ENCOUNTER — Other Ambulatory Visit: Payer: Self-pay | Admitting: Family Medicine

## 2023-11-22 ENCOUNTER — Encounter: Payer: Self-pay | Admitting: Family Medicine

## 2023-11-22 NOTE — Telephone Encounter (Signed)
 Name of Medication: Alprazolam Name of Pharmacy: [facility] Last Fill or Written Date and Quantity: 05/01/22, #30 Last Office Visit and Type: 11/08/23, Acute 06/18/23 AWV Next Office Visit and Type: 06/20/24 AWV Last Controlled Substance Agreement Date: 12/23/14 Last UDS: 12/23/14

## 2023-11-24 MED ORDER — ALPRAZOLAM 1 MG PO TABS: ORAL_TABLET | ORAL | 0 refills | Status: DC

## 2023-11-24 NOTE — Telephone Encounter (Signed)
 ERx

## 2023-11-29 ENCOUNTER — Encounter: Payer: Self-pay | Admitting: Internal Medicine

## 2023-11-29 ENCOUNTER — Ambulatory Visit (INDEPENDENT_AMBULATORY_CARE_PROVIDER_SITE_OTHER): Admitting: Internal Medicine

## 2023-11-29 VITALS — BP 132/80 | HR 65 | Temp 98.5°F | Ht 65.5 in | Wt 156.0 lb

## 2023-11-29 DIAGNOSIS — K2100 Gastro-esophageal reflux disease with esophagitis, without bleeding: Secondary | ICD-10-CM | POA: Insufficient documentation

## 2023-11-29 MED ORDER — OMEPRAZOLE 20 MG PO CPDR
20.0000 mg | DELAYED_RELEASE_CAPSULE | Freq: Two times a day (BID) | ORAL | 3 refills | Status: DC
Start: 2023-11-29 — End: 2024-02-18
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
## 2023-11-29 NOTE — Assessment & Plan Note (Addendum)
 Classic esophageal symptoms Will Rx omeprazole 20mg  for bid use for now Can cut back to once a day after symptoms better (at least two weeks for the twice a day)---but continue at least one a day for 4-6 weeks at minimum

## 2023-12-01 DIAGNOSIS — Z0100 Encounter for examination of eyes and vision without abnormal findings: Secondary | ICD-10-CM | POA: Diagnosis not present

## 2023-12-03 ENCOUNTER — Ambulatory Visit

## 2023-12-03 VITALS — Ht 65.5 in | Wt 156.0 lb

## 2023-12-03 DIAGNOSIS — Z0000 Encounter for general adult medical examination without abnormal findings: Secondary | ICD-10-CM
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
## 2023-12-03 NOTE — Patient Instructions (Signed)
 Rachel Mcintosh , Thank you for taking time to come for your Medicare Wellness Visit. I appreciate your ongoing commitment to your health goals. Please review the following plan we discussed and let me know if I can assist you in the future.   Referrals/Orders/Follow-Ups/Clinician Recommendations: none  This is a list of the screening recommended for you and due dates:  Health Maintenance  Topic Date Due   Pneumonia Vaccine (2 of 2 - PCV) 05/12/2013   Flu Shot  03/31/2024   Screening for Lung Cancer  05/05/2024   Mammogram  07/11/2024   Medicare Annual Wellness Visit  12/02/2024   Colon Cancer Screening  12/24/2031   DEXA scan (bone density measurement)  Completed   Hepatitis C Screening  Completed   HIV Screening  Completed   Zoster (Shingles) Vaccine  Completed   HPV Vaccine  Aged Out   DTaP/Tdap/Td vaccine  Discontinued   COVID-19 Vaccine  Discontinued    Advanced directives: (Copy Requested) Please bring a copy of your health care power of attorney and living will to the office to be added to your chart at your convenience. You can mail to [facility] [address] [place], [place] [address] or email to [email or web address]  Next Medicare Annual Wellness Visit scheduled for next year: Yes 12/06/23 @ 1pm televisit

## 2023-12-13 ENCOUNTER — Telehealth: Payer: Self-pay | Admitting: Acute Care

## 2023-12-13 DIAGNOSIS — F17210 Nicotine dependence, cigarettes, uncomplicated: Secondary | ICD-10-CM

## 2023-12-13 DIAGNOSIS — Z87891 Personal history of nicotine dependence: Secondary | ICD-10-CM

## 2023-12-13 DIAGNOSIS — Z122 Encounter for screening for malignant neoplasm of respiratory organs: Secondary | ICD-10-CM

## 2023-12-13 NOTE — Telephone Encounter (Signed)
 Patient had a 6 month follow up scan on 11/09/2023 that resulted as an LR2 with mention of MAC. Patient has been seen in the past for MAC and was followed by Dr. Waymond Hailey in 2020. Dara Ear NP has reviewed pt's results and chart and has suggested we offer pt a pulm consult with Dr. Waymond Hailey or any other pulmonologist as it has been 5 years since last seen. Otherwise, patient will need annual lung screening scan in 10/2024.     IMPRESSION: 1. Lung-RADS 2, benign appearance or behavior. Continue annual screening with low-dose chest CT without contrast in 12 months. 2. Probable chronic mycobacterium avium complex. 3. Right adrenal adenoma. 4.  Aortic atherosclerosis (ICD10-I70.0). 5.  Emphysema (ICD10-J43.9).     Electronically Signed   By: Shearon Denis M.D.   On: 12/06/2023 11:02

## 2023-12-13 NOTE — Telephone Encounter (Signed)
 Called and spoke with pt. Informed her of the results and the suggested a pulmonary follow up regarding the MAC finding. Patient agreeable to pulmonary follow up. Scheduled appt with Dr. Diania Fortes on 01/26/24. Patient aware to continue with annual LCS, due 10/2024. Results sent to PCP.

## 2023-12-16 NOTE — Telephone Encounter (Signed)
 Spoke with patient.

## 2023-12-17 ENCOUNTER — Encounter (INDEPENDENT_AMBULATORY_CARE_PROVIDER_SITE_OTHER): Payer: Self-pay | Admitting: Family Medicine

## 2023-12-17 DIAGNOSIS — I10 Essential (primary) hypertension: Secondary | ICD-10-CM

## 2023-12-20 DIAGNOSIS — I10 Essential (primary) hypertension: Secondary | ICD-10-CM

## 2023-12-20 MED ORDER — AMLODIPINE BESYLATE 2.5 MG PO TABS: 2.5000 mg | ORAL_TABLET | Freq: Every day | ORAL | 11 refills | Status: DC

## 2023-12-20 NOTE — Telephone Encounter (Signed)
 BP log reviewed - wide fluctuations in BP ranging from 120-166/70-90s, HR 60-80s.   Please see the MyChart message reply(ies) for my assessment and plan.  The patient gave consent for this Medical Advice Message and is aware that it may result in a bill to their insurance company as well as the possibility that this may result in a co-payment or deductible. They are an established patient, but are not seeking medical advice exclusively about a problem treated during an in person or video visit in the last 7 days. I did not recommend an in person or video visit within 7 days of my reply.  I spent a total of 7 minutes cumulative time within 7 days through MyChart messaging Claire Crick, MD

## 2024-01-26 ENCOUNTER — Encounter: Payer: Self-pay | Admitting: Pulmonary Disease

## 2024-01-26 ENCOUNTER — Ambulatory Visit: Admitting: Pulmonary Disease

## 2024-01-26 VITALS — BP 120/74 | HR 78 | Ht 66.0 in | Wt 159.6 lb

## 2024-01-26 DIAGNOSIS — K219 Gastro-esophageal reflux disease without esophagitis: Secondary | ICD-10-CM | POA: Diagnosis not present

## 2024-01-26 DIAGNOSIS — J432 Centrilobular emphysema: Secondary | ICD-10-CM

## 2024-01-26 DIAGNOSIS — Z87891 Personal history of nicotine dependence: Secondary | ICD-10-CM

## 2024-01-26 DIAGNOSIS — R918 Other nonspecific abnormal finding of lung field: Secondary | ICD-10-CM | POA: Diagnosis not present
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
## 2024-01-26 NOTE — Patient Instructions (Addendum)
 Continue albuterol  inhaler as needed  We will schedule you for pulmonary function tests at the front desk  We will continue to monitor your CT Chest scans for the nodules which are likely mucous congestion  Follow up in April 2026 to review your CT Chest scan  Call sooner if you have any flares in your breathing

## 2024-02-18 ENCOUNTER — Encounter: Payer: Self-pay | Admitting: Family Medicine

## 2024-02-18 ENCOUNTER — Ambulatory Visit (INDEPENDENT_AMBULATORY_CARE_PROVIDER_SITE_OTHER): Admitting: Family Medicine

## 2024-02-18 VITALS — BP 116/80 | HR 67 | Temp 97.7°F | Ht 66.0 in | Wt 157.0 lb

## 2024-02-18 DIAGNOSIS — I480 Paroxysmal atrial fibrillation: Secondary | ICD-10-CM | POA: Insufficient documentation

## 2024-02-18 DIAGNOSIS — I4891 Unspecified atrial fibrillation: Secondary | ICD-10-CM | POA: Insufficient documentation

## 2024-02-18 DIAGNOSIS — J020 Streptococcal pharyngitis: Secondary | ICD-10-CM | POA: Insufficient documentation

## 2024-02-18 DIAGNOSIS — J029 Acute pharyngitis, unspecified: Secondary | ICD-10-CM | POA: Diagnosis not present

## 2024-02-18 DIAGNOSIS — I499 Cardiac arrhythmia, unspecified: Secondary | ICD-10-CM | POA: Diagnosis not present

## 2024-02-18 DIAGNOSIS — R002 Palpitations: Secondary | ICD-10-CM | POA: Diagnosis not present

## 2024-02-18 LAB — POCT RAPID STREP A (OFFICE): Rapid Strep A Screen: POSITIVE — AB

## 2024-02-18 MED ORDER — AMOXICILLIN 500 MG PO CAPS: 500.0000 mg | ORAL_CAPSULE | Freq: Two times a day (BID) | ORAL | 0 refills | Status: AC

## 2024-02-18 MED ORDER — METOPROLOL SUCCINATE ER 25 MG PO TB24: 25.0000 mg | ORAL_TABLET | Freq: Every day | ORAL | 1 refills | Status: DC

## 2024-02-18 MED ORDER — APIXABAN 5 MG PO TABS: 5.0000 mg | ORAL_TABLET | Freq: Two times a day (BID) | ORAL | 3 refills | Status: DC

## 2024-02-18 NOTE — Assessment & Plan Note (Addendum)
 Of 1 wk duration.  Exam with L AC LAD and pharyngeal erythema.  RST faintly positive.  Rx amoxicillin  500mg  bid x 10 days. Tylenol  PRN discomfort.

## 2024-02-18 NOTE — Progress Notes (Signed)
 Ph: [phone] Fax: [phone]   Patient ID: Rachel Mcintosh, female    DOB: 12-09-1955, 67 y.o.   MRN: [id number]  This visit was conducted in person.  BP 116/80   Pulse 67   Temp 97.7 F (36.5 C) (Oral)   Ht 5' 6 (1.676 m)   Wt 157 lb (71.2 kg)   SpO2 99%   BMI 25.34 kg/m   BP Readings from Last 3 Encounters:  02/18/24 116/80  01/26/24 120/74  11/29/23 132/80   CC: ST  Subjective:   HPI: Rachel Mcintosh is a 68 y.o. female presenting on 02/18/2024 for Sore Throat (C/o pain when swallowing. Denies any other sxs. Sxs started about 1 wk ago. )   1 wk h/o L sided throat discomfort. This lingers and is now progressively worsening during the day. She has noted mild cough present as well as rhinorrhea.    No fevers/chills, ear pain, congestion, abd pain, nausea, vomiting, diarrhea.  Did take a lot of tylenol  for this.  No sick contacts at home.   In process of getting crown - left upper tooth. Currently has temporary tooth in place.   Seen recently with cervical lymphadenitis treated with keflex  abx TID 7d course 10/2023.  Again seen for GERD exacerbation with omeprazole  20mg  bid after cefdinir  + prednisone  course - now down to PPI once daily.   Notes very itchy eye to right upper eye lid with dry stop.   H/o palpitations,  CHADSVASC2 score = 3 (age, HTN, female)     Relevant past medical, surgical, family and social history reviewed and updated as indicated. Interim medical history since our last visit reviewed. Allergies and medications reviewed and updated. Outpatient Medications Prior to Visit  Medication Sig Dispense Refill   albuterol  (VENTOLIN  HFA) 108 (90 Base) MCG/ACT inhaler Inhale 2 puffs into the lungs every 6 (six) hours as needed for wheezing or shortness of breath. 8 g 3   alendronate  (FOSAMAX ) 70 MG tablet Take 1 tablet (70 mg total) by mouth every 7 (seven) days. Take with a full glass of water on an empty stomach. 4 tablet 11   ALPRAZolam   (XANAX ) 1 MG tablet TAKE 1 TABLET BY MOUTH TWICE A DAY AS NEEDED FOR ANXIETY 30 tablet 0   Calcium  Carbonate-Vitamin D3 (CALCIUM  600/VITAMIN D ) 600-400 MG-UNIT TABS Take 1 tablet by mouth daily.     Cholecalciferol (VITAMIN D3) 25 MCG (1000 UT) CAPS Take 1 capsule (1,000 Units total) by mouth daily.     valACYclovir  (VALTREX ) 1000 MG tablet TAKE 2 TABLETS BY MOUTH TWICE DAILY FOR  1  DAY  PER  FLARE 20 tablet 3   vitamin B-12 (V-R VITAMIN B-12) 500 MCG tablet Take 1 tablet (500 mcg total) by mouth 3 (three) times a week.     amLODipine  (NORVASC ) 2.5 MG tablet Take 1 tablet (2.5 mg total) by mouth daily. 30 tablet 11   omeprazole  (PRILOSEC) 20 MG capsule Take 1 capsule (20 mg total) by mouth in the morning and at bedtime. (Patient taking differently: Take 20 mg by mouth daily.) 60 capsule 3   omeprazole  (PRILOSEC) 20 MG capsule Take 1 capsule (20 mg total) by mouth daily.     No facility-administered medications prior to visit.     Per HPI unless specifically indicated in ROS section below Review of Systems  Objective:  BP 116/80   Pulse 67   Temp 97.7 F (36.5 C) (Oral)   Ht 5' 6 (1.676 m)  Wt 157 lb (71.2 kg)   SpO2 99%   BMI 25.34 kg/m   Wt Readings from Last 3 Encounters:  02/18/24 157 lb (71.2 kg)  01/26/24 159 lb 9.6 oz (72.4 kg)  12/03/23 156 lb (70.8 kg)      Physical Exam Vitals and nursing note reviewed.  Constitutional:      Appearance: Normal appearance. She is not ill-appearing.  HENT:     Head: Normocephalic and atraumatic.     Right Ear: Tympanic membrane, ear canal and external ear normal. There is no impacted cerumen.     Left Ear: Tympanic membrane, ear canal and external ear normal. There is no impacted cerumen.     Nose: Nose normal. No congestion or rhinorrhea.     Mouth/Throat:     Mouth: Mucous membranes are moist.     Pharynx: Oropharynx is clear. Posterior oropharyngeal erythema (to left posterior oropharynx) present. No oropharyngeal exudate.    Eyes:     Extraocular Movements: Extraocular movements intact.     Conjunctiva/sclera: Conjunctivae normal.     Pupils: Pupils are equal, round, and reactive to light.    Cardiovascular:     Rate and Rhythm: Tachycardia present. Rhythm irregularly irregular.     Pulses: Normal pulses.     Heart sounds: Normal heart sounds. No murmur heard. Pulmonary:     Effort: Pulmonary effort is normal. No respiratory distress.     Breath sounds: Normal breath sounds. No wheezing, rhonchi or rales.   Musculoskeletal:     Right lower leg: No edema.     Comments:  S/p L BKA  Lymphadenopathy:     Head:     Right side of head: No submental, submandibular, tonsillar, preauricular or posterior auricular adenopathy.     Left side of head: No submental, submandibular, tonsillar, preauricular or posterior auricular adenopathy.     Cervical: Cervical adenopathy present.     Right cervical: No superficial, deep or posterior cervical adenopathy.    Left cervical: Superficial cervical adenopathy present. No deep or posterior cervical adenopathy.     Upper Body:     Right upper body: No supraclavicular adenopathy.     Left upper body: No supraclavicular adenopathy.   Skin:    General: Skin is warm and dry.     Findings: No rash.   Neurological:     Mental Status: She is alert.   Psychiatric:        Mood and Affect: Mood normal.        Behavior: Behavior normal.       Results for orders placed or performed in visit on 02/18/24  POCT rapid strep A   Collection Time: 02/18/24  2:36 PM  Result Value Ref Range   Rapid Strep A Screen Positive (A) Negative  Basic metabolic panel with GFR   Collection Time: 02/18/24  3:31 PM  Result Value Ref Range   Glucose, Bld 83 65 - 99 mg/dL   BUN 11 7 - 25 mg/dL   Creat 9.00 9.49 - 8.94 mg/dL   eGFR 63 > OR = 60 fO/fpw/8.26f7   BUN/Creatinine Ratio SEE NOTE: 6 - 22 (calc)   Sodium 139 135 - 146 mmol/L   Potassium 4.3 3.5 - 5.3 mmol/L   Chloride 101 98  - 110 mmol/L   CO2 22 20 - 32 mmol/L   Calcium  9.9 8.6 - 10.4 mg/dL  CBC with Differential/Platelet   Collection Time: 02/18/24  3:31 PM  Result Value Ref Range  WBC 6.7 3.8 - 10.8 Thousand/uL   RBC 5.09 3.80 - 5.10 Million/uL   Hemoglobin 14.5 11.7 - 15.5 g/dL   HCT 55.5 64.9 - 54.9 %   MCV 87.2 80.0 - 100.0 fL   MCH 28.5 27.0 - 33.0 pg   MCHC 32.7 32.0 - 36.0 g/dL   RDW 87.5 88.9 - 84.9 %   Platelets 365 140 - 400 Thousand/uL   MPV 10.0 7.5 - 12.5 fL   Neutro Abs 3,745 1,500 - 7,800 cells/uL   Absolute Lymphocytes 1,977 850 - 3,900 cells/uL   Absolute Monocytes 637 200 - 950 cells/uL   Eosinophils Absolute 241 15 - 500 cells/uL   Basophils Absolute 101 0 - 200 cells/uL   Neutrophils Relative % 55.9 %   Total Lymphocyte 29.5 %   Monocytes Relative 9.5 %   Eosinophils Relative 3.6 %   Basophils Relative 1.5 %  TSH   Collection Time: 02/18/24  3:31 PM  Result Value Ref Range   TSH 0.67 0.40 - 4.50 mIU/L   EKG - atrial fibrillation with RVR, normal axis   Assessment & Plan:   Problem List Items Addressed This Visit     Palpitations   H/o this, prior 2 wk Zio heart monitor 07/2023 showing nonsustained SVT, longest run 14 beats, without afib or sustained arrhythmia.       RESOLVED: Irregular heart beat   Relevant Orders   EKG 12-Lead   Strep pharyngitis   Of 1 wk duration.  Exam with L AC LAD and pharyngeal erythema.  RST faintly positive.  Rx amoxicillin  500mg  bid x 10 days. Tylenol  PRN discomfort.       Atrial fibrillation with RVR (HCC) - Primary   Tachycardia with irregular heart rate on cardiac auscultation today - pt largely asymptomatic.  EKG suggestive of atrial fibrillation with RVR. CHA2DS2VASc score = 3  Reviewed increased stroke risk.  Will check TSH, CBC Will stop low dose amlodipine , start toprol  XL 25mg  daily with option to decrease to 1/2  tab if bradycardia or hypotension develops. Will start eliquis  5mg  bid - reviewing increased bleeding risk.  Pt hesitant for longterm anticoagulation - will refer to cardiology for further evaluation.       Relevant Medications   metoprolol  succinate (TOPROL -XL) 25 MG 24 hr tablet   apixaban  (ELIQUIS ) 5 MG TABS tablet   Other Relevant Orders   Ambulatory referral to Cardiology   Basic metabolic panel with GFR (Completed)   CBC with Differential/Platelet (Completed)   TSH (Completed)   Other Visit Diagnoses       Sore throat       Relevant Orders   POCT rapid strep A (Completed)        Meds ordered this encounter  Medications   amoxicillin  (AMOXIL ) 500 MG capsule    Sig: Take 1 capsule (500 mg total) by mouth 2 (two) times daily for 10 days.    Dispense:  20 capsule    Refill:  0   metoprolol  succinate (TOPROL -XL) 25 MG 24 hr tablet    Sig: Take 1 tablet (25 mg total) by mouth daily.    Dispense:  90 tablet    Refill:  1    To replace amlodipine    apixaban  (ELIQUIS ) 5 MG TABS tablet    Sig: Take 1 tablet (5 mg total) by mouth 2 (two) times daily.    Dispense:  60 tablet    Refill:  3    Orders Placed This Encounter  Procedures  Basic metabolic panel with GFR   CBC with Differential/Platelet   TSH   Ambulatory referral to Cardiology    Referral Priority:   Routine    Referral Type:   Consultation    Referral Reason:   Specialty Services Required    Number of Visits Requested:   1   POCT rapid strep A   EKG 12-Lead    Patient Instructions  EKG today for irregular heart beat - you are in atrial fibrillation.  Labs today Stop amlodipine . Start Toprol  XL 25mg  daily.  Start eliquis  5mg  twice daily.  I will refer you to cardiology for further evaluation.   You have strep pharyngitis. This is likely contributing to swollen glands on left neck. Let us  know if persists despite antibiotic treatment. Push fluids and plenty of rest. May use ibuprofen  as needed for throat pain.  Salt water gargles. Suck on cold things like popsicles or warm things like herbal teas (whichever  soothes the throat better). Return if fever >101.5, worsening pain, or trouble opening/closing mouth, or hoarse voice. Good to see you today, call clinic with questions.   Follow up plan: Return if symptoms worsen or fail to improve.  Anton Blas, MD

## 2024-02-18 NOTE — Patient Instructions (Addendum)
 EKG today for irregular heart beat - you are in atrial fibrillation.  Labs today Stop amlodipine . Start Toprol XL 25mg  daily.  Start eliquis 5mg  twice daily.  I will refer you to cardiology for further evaluation.   You have strep pharyngitis. This is likely contributing to swollen glands on left neck. Let us  know if persists despite antibiotic treatment. Push fluids and plenty of rest. May use ibuprofen  as needed for throat pain.  Salt water gargles. Suck on cold things like popsicles or warm things like herbal teas (whichever soothes the throat better). Return if fever >101.5, worsening pain, or trouble opening/closing mouth, or hoarse voice. Good to see you today, call clinic with questions.

## 2024-02-19 ENCOUNTER — Ambulatory Visit: Payer: Self-pay | Admitting: Family Medicine

## 2024-02-19 LAB — CBC WITH DIFFERENTIAL/PLATELET
Absolute Lymphocytes: 1977 {cells}/uL (ref 850–3900)
Absolute Monocytes: 637 {cells}/uL (ref 200–950)
Basophils Absolute: 101 {cells}/uL (ref 0–200)
Basophils Relative: 1.5 %
Eosinophils Absolute: 241 {cells}/uL (ref 15–500)
Eosinophils Relative: 3.6 %
HCT: 44.4 % (ref 35.0–45.0)
Hemoglobin: 14.5 g/dL (ref 11.7–15.5)
MCH: 28.5 pg (ref 27.0–33.0)
MCHC: 32.7 g/dL (ref 32.0–36.0)
MCV: 87.2 fL (ref 80.0–100.0)
MPV: 10 fL (ref 7.5–12.5)
Monocytes Relative: 9.5 %
Neutro Abs: 3745 {cells}/uL (ref 1500–7800)
Neutrophils Relative %: 55.9 %
Platelets: 365 10*3/uL (ref 140–400)
RBC: 5.09 10*6/uL (ref 3.80–5.10)
RDW: 12.4 % (ref 11.0–15.0)
Total Lymphocyte: 29.5 %
WBC: 6.7 10*3/uL (ref 3.8–10.8)

## 2024-02-19 LAB — BASIC METABOLIC PANEL WITH GFR
BUN: 11 mg/dL (ref 7–25)
CO2: 22 mmol/L (ref 20–32)
Calcium: 9.9 mg/dL (ref 8.6–10.4)
Chloride: 101 mmol/L (ref 98–110)
Creat: 0.99 mg/dL (ref 0.50–1.05)
Glucose, Bld: 83 mg/dL (ref 65–99)
Potassium: 4.3 mmol/L (ref 3.5–5.3)
Sodium: 139 mmol/L (ref 135–146)
eGFR: 63 mL/min/{1.73_m2} (ref >=60)

## 2024-02-19 LAB — TSH: TSH: 0.67 m[IU]/L (ref 0.40–4.50)

## 2024-02-19 NOTE — Assessment & Plan Note (Addendum)
 Tachycardia with irregular heart rate on cardiac auscultation today - pt largely asymptomatic.  EKG suggestive of atrial fibrillation with RVR. CHA2DS2VASc score = 3  Reviewed increased stroke risk.  Will check TSH, CBC Will stop low dose amlodipine , start toprol  XL 25mg  daily with option to decrease to 1/2  tab if bradycardia or hypotension develops. Will start eliquis  5mg  bid - reviewing increased bleeding risk. Pt hesitant for longterm anticoagulation - will refer to cardiology for further evaluation.

## 2024-02-19 NOTE — Assessment & Plan Note (Signed)
 H/o this, prior 2 wk Zio heart monitor 07/2023 showing nonsustained SVT, longest run 14 beats, without afib or sustained arrhythmia.

## 2024-02-21 ENCOUNTER — Encounter: Payer: Self-pay | Admitting: Family Medicine

## 2024-03-13 ENCOUNTER — Ambulatory Visit: Attending: Family Medicine

## 2024-03-13 ENCOUNTER — Encounter: Payer: Self-pay | Admitting: Family Medicine

## 2024-03-13 ENCOUNTER — Ambulatory Visit: Admitting: Pulmonary Disease

## 2024-03-13 ENCOUNTER — Ambulatory Visit (INDEPENDENT_AMBULATORY_CARE_PROVIDER_SITE_OTHER): Admitting: Family Medicine

## 2024-03-13 VITALS — BP 118/62 | HR 83 | Temp 98.6°F | Ht 66.0 in | Wt 158.0 lb

## 2024-03-13 DIAGNOSIS — J441 Chronic obstructive pulmonary disease with (acute) exacerbation: Secondary | ICD-10-CM

## 2024-03-13 DIAGNOSIS — J432 Centrilobular emphysema: Secondary | ICD-10-CM

## 2024-03-13 DIAGNOSIS — J439 Emphysema, unspecified: Secondary | ICD-10-CM | POA: Diagnosis not present

## 2024-03-13 DIAGNOSIS — I4891 Unspecified atrial fibrillation: Secondary | ICD-10-CM

## 2024-03-13 LAB — PULMONARY FUNCTION TEST
DL/VA % pred: 72 %
DL/VA: 3 ml/min/mmHg/L
DLCO cor % pred: 59 %
DLCO cor: 12.64 ml/min/mmHg
DLCO unc % pred: 61 %
DLCO unc: 13.04 ml/min/mmHg
FEF 25-75 Post: 1 L/s
FEF 25-75 Pre: 1 L/s
FEF2575-%Change-Post: 0 %
FEF2575-%Pred-Post: 44 %
FEF2575-%Pred-Pre: 44 %
FEV1-%Change-Post: 0 %
FEV1-%Pred-Post: 66 %
FEV1-%Pred-Pre: 65 %
FEV1-Post: 1.73 L
FEV1-Pre: 1.72 L
FEV1FVC-%Change-Post: -2 %
FEV1FVC-%Pred-Pre: 87 %
FEV6-%Change-Post: 1 %
FEV6-%Pred-Post: 79 %
FEV6-%Pred-Pre: 77 %
FEV6-Post: 2.59 L
FEV6-Pre: 2.55 L
FEV6FVC-%Change-Post: -1 %
FEV6FVC-%Pred-Post: 102 %
FEV6FVC-%Pred-Pre: 104 %
FVC-%Change-Post: 3 %
FVC-%Pred-Post: 77 %
FVC-%Pred-Pre: 74 %
FVC-Post: 2.63 L
FVC-Pre: 2.55 L
Post FEV1/FVC ratio: 66 %
Post FEV6/FVC ratio: 99 %
Pre FEV1/FVC ratio: 67 %
Pre FEV6/FVC Ratio: 100 %
RV % pred: 132 %
RV: 2.9 L
TLC % pred: 108 %
TLC: 5.83 L

## 2024-03-13 MED ORDER — PREDNISONE 20 MG PO TABS
ORAL_TABLET | ORAL | 0 refills | Status: DC
Start: 2024-03-13 — End: 2024-04-27

## 2024-03-13 MED ORDER — DOXYCYCLINE HYCLATE 100 MG PO TABS: 100.0000 mg | ORAL_TABLET | Freq: Two times a day (BID) | ORAL | 0 refills | Status: DC

## 2024-03-13 NOTE — Patient Instructions (Addendum)
 You likely have a COPD exacerbation. Treat with prednisone  taper sent to pharmacy, if not improving then start antibiotic doxycycline  7d course.  Let us  know if not improving with treatment. I will also go ahead and order repeat heart monitor to evaluate for any ongoing atrial fibrillation

## 2024-03-13 NOTE — Assessment & Plan Note (Signed)
 Increased cough, sputum purulence, and shortness of breath consistent with COPD exacerbation.  Rx prednisone  taper.  Anticipate viral at this time however will provide with WASP for doxycycline  7d course with indications when to fill.  Otherwise continue albuterol  rescue inhaler  PRN

## 2024-03-13 NOTE — Progress Notes (Signed)
 Ph: [phone] Fax: [phone]   Patient ID: Rachel Mcintosh, female    DOB: 1955-11-20, 68 y.o.   MRN: [id number]  This visit was conducted in person.  BP 118/62   Pulse 83   Temp 98.6 F (37 C) (Oral)   Ht 5' 6 (1.676 m)   Wt 158 lb (71.7 kg)   SpO2 95%   BMI 25.50 kg/m    CC: cough, dyspnea Subjective:   HPI: Rachel Mcintosh is a 68 y.o. female presenting on 03/13/2024 for Cough (X 1 week productive with some sob )   See prior note for details.  Last visit seen here with ST treated for strep throat with amoxicillin  10d course.  During that visit found to be in atrial fibrillation with RVR pulse up to 120s - started on eliquis  5mg  bid and metoprolol  succinate 25mg  daily in place of amlodipine . Labs unrevealing. CHADSVASC2 score =3. Pending cardiology evaluation for 05/2024.   1+ wk h/o productive cough of yellow/green sputum, over weekend developed chest soreness/achiness, exertional dyspnea. Mild nausea, occ HA. Having coughing fits. Increased cough, sputum purulence, increased dyspnea and wheezing.   Chest > head congestion.  No fevers/chills, palpitations, dizziness, ST, wheezing.  Treating with tylenol       Relevant past medical, surgical, family and social history reviewed and updated as indicated. Interim medical history since our last visit reviewed. Allergies and medications reviewed and updated. Outpatient Medications Prior to Visit  Medication Sig Dispense Refill   albuterol  (VENTOLIN  HFA) 108 (90 Base) MCG/ACT inhaler Inhale 2 puffs into the lungs every 6 (six) hours as needed for wheezing or shortness of breath. 8 g 3   alendronate  (FOSAMAX ) 70 MG tablet Take 1 tablet (70 mg total) by mouth every 7 (seven) days. Take with a full glass of water on an empty stomach. 4 tablet 11   ALPRAZolam  (XANAX ) 1 MG tablet TAKE 1 TABLET BY MOUTH TWICE A DAY AS NEEDED FOR ANXIETY 30 tablet 0   apixaban  (ELIQUIS ) 5 MG TABS tablet Take 1 tablet (5 mg total) by  mouth 2 (two) times daily. 60 tablet 3   Calcium  Carbonate-Vitamin D3 (CALCIUM  600/VITAMIN D ) 600-400 MG-UNIT TABS Take 1 tablet by mouth daily.     Cholecalciferol (VITAMIN D3) 25 MCG (1000 UT) CAPS Take 1 capsule (1,000 Units total) by mouth daily.     metoprolol  succinate (TOPROL -XL) 25 MG 24 hr tablet Take 1 tablet (25 mg total) by mouth daily. 90 tablet 1   omeprazole  (PRILOSEC) 20 MG capsule Take 1 capsule (20 mg total) by mouth daily.     valACYclovir  (VALTREX ) 1000 MG tablet TAKE 2 TABLETS BY MOUTH TWICE DAILY FOR  1  DAY  PER  FLARE 20 tablet 3   vitamin B-12 (V-R VITAMIN B-12) 500 MCG tablet Take 1 tablet (500 mcg total) by mouth 3 (three) times a week.     No facility-administered medications prior to visit.     Per HPI unless specifically indicated in ROS section below Review of Systems  Objective:  BP 118/62   Pulse 83   Temp 98.6 F (37 C) (Oral)   Ht 5' 6 (1.676 m)   Wt 158 lb (71.7 kg)   SpO2 95%   BMI 25.50 kg/m   Wt Readings from Last 3 Encounters:  03/13/24 158 lb (71.7 kg)  02/18/24 157 lb (71.2 kg)  01/26/24 159 lb 9.6 oz (72.4 kg)      Physical Exam Vitals and nursing note reviewed.  Constitutional:      Appearance: Normal appearance. She is not ill-appearing.  HENT:     Head: Normocephalic and atraumatic.     Right Ear: Tympanic membrane, ear canal and external ear normal. There is no impacted cerumen.     Left Ear: Tympanic membrane, ear canal and external ear normal. There is no impacted cerumen.     Nose: Nose normal. No congestion or rhinorrhea.     Mouth/Throat:     Mouth: Mucous membranes are moist.     Pharynx: Oropharynx is clear. No oropharyngeal exudate or posterior oropharyngeal erythema.  Eyes:     Extraocular Movements: Extraocular movements intact.     Conjunctiva/sclera: Conjunctivae normal.     Pupils: Pupils are equal, round, and reactive to light.  Cardiovascular:     Rate and Rhythm: Normal rate and regular rhythm.     Pulses:  Normal pulses.     Heart sounds: Normal heart sounds. No murmur heard. Pulmonary:     Effort: Pulmonary effort is normal. No respiratory distress.     Breath sounds: Normal breath sounds. No wheezing, rhonchi or rales.     Comments: Lungs largely clear today  Lymphadenopathy:     Head:     Right side of head: No submental, submandibular, tonsillar, preauricular or posterior auricular adenopathy.     Left side of head: No submental, submandibular, tonsillar, preauricular or posterior auricular adenopathy.     Cervical: No cervical adenopathy.     Right cervical: No superficial cervical adenopathy.    Left cervical: No superficial cervical adenopathy.     Upper Body:     Right upper body: No supraclavicular adenopathy.     Left upper body: No supraclavicular adenopathy.  Skin:    Findings: No rash.  Neurological:     Mental Status: She is alert.  Psychiatric:        Mood and Affect: Mood normal.        Behavior: Behavior normal.        Assessment & Plan:   Problem List Items Addressed This Visit     COPD exacerbation (HCC) - Primary   Increased cough, sputum purulence, and shortness of breath consistent with COPD exacerbation.  Rx prednisone  taper.  Anticipate viral at this time however will provide with WASP for doxycycline  7d course with indications when to fill.  Otherwise continue albuterol  rescue inhaler  PRN      Relevant Medications   predniSONE  (DELTASONE ) 20 MG tablet   Emphysema/COPD (HCC)   She notes she had PFTs completed today - discussed may need to repeat this when healthy from respiratory standpoint.       Relevant Medications   predniSONE  (DELTASONE ) 20 MG tablet   Atrial fibrillation with RVR (HCC)   Sounds regular today.  Episode of afib last month - now on metoprolol  and eliquis .  Will order Zio patch heart monitor. Has cardiology f/u scheduled for 05/2024      Relevant Orders   LONG TERM MONITOR (3-14 DAYS)     Meds ordered this encounter   Medications   predniSONE  (DELTASONE ) 20 MG tablet    Sig: Take two tablets daily for 4 days followed by one tablet daily for 4 days    Dispense:  12 tablet    Refill:  0   doxycycline  (VIBRA -TABS) 100 MG tablet    Sig: Take 1 tablet (100 mg total) by mouth 2 (two) times daily.    Dispense:  14 tablet  Refill:  0    Orders Placed This Encounter  Procedures   LONG TERM MONITOR (3-14 DAYS)    Standing Status:   Future    Number of Occurrences:   1    Expiration Date:   03/13/2025    Where should this test be performed?:   [facility]    Does the patient have an implanted cardiac device?:   No    Prescribed days of wear:   14    Type of enrollment:   Home Enrollment    Vendor::   [employer]    Reason for Exam:   PAF, Prox A-Fib I48.0    Patient Instructions  You likely have a COPD exacerbation. Treat with prednisone  taper sent to pharmacy, if not improving then start antibiotic doxycycline  7d course.  Let us  know if not improving with treatment. I will also go ahead and order repeat heart monitor to evaluate for any ongoing atrial fibrillation   Follow up plan: Return if symptoms worsen or fail to improve.  Anton Blas, MD

## 2024-03-13 NOTE — Assessment & Plan Note (Addendum)
 Sounds regular today.  Episode of afib last month - now on metoprolol  and eliquis .  Will order Zio patch heart monitor. Has cardiology f/u scheduled for 05/2024

## 2024-03-13 NOTE — Progress Notes (Signed)
 Full PFT performed today.

## 2024-03-13 NOTE — Patient Instructions (Signed)
 Full PFT performed today.

## 2024-03-13 NOTE — Assessment & Plan Note (Signed)
 She notes she had PFTs completed today - discussed may need to repeat this when healthy from respiratory standpoint.

## 2024-04-02 ENCOUNTER — Ambulatory Visit: Payer: Self-pay | Admitting: Pulmonary Disease

## 2024-04-07 ENCOUNTER — Other Ambulatory Visit: Payer: Self-pay | Admitting: Family Medicine

## 2024-04-07 DIAGNOSIS — I4891 Unspecified atrial fibrillation: Secondary | ICD-10-CM

## 2024-04-07 DIAGNOSIS — Z1231 Encounter for screening mammogram for malignant neoplasm of breast: Secondary | ICD-10-CM

## 2024-04-10 ENCOUNTER — Ambulatory Visit: Payer: Self-pay | Admitting: Family Medicine

## 2024-04-10 DIAGNOSIS — I4891 Unspecified atrial fibrillation: Secondary | ICD-10-CM

## 2024-04-27 ENCOUNTER — Other Ambulatory Visit (HOSPITAL_COMMUNITY): Payer: Self-pay

## 2024-04-27 ENCOUNTER — Encounter (HOSPITAL_COMMUNITY): Payer: Self-pay | Admitting: Internal Medicine

## 2024-04-27 ENCOUNTER — Ambulatory Visit (HOSPITAL_COMMUNITY)
Admission: RE | Admit: 2024-04-27 | Discharge: 2024-04-27 | Disposition: A | Discharge status: Home / Self Care | Source: Ambulatory Visit | Attending: Internal Medicine | Admitting: Internal Medicine

## 2024-04-27 VITALS — BP 160/100 | HR 73 | Ht 66.0 in | Wt 159.4 lb

## 2024-04-27 DIAGNOSIS — I480 Paroxysmal atrial fibrillation: Secondary | ICD-10-CM | POA: Diagnosis not present

## 2024-04-27 DIAGNOSIS — I4819 Other persistent atrial fibrillation: Secondary | ICD-10-CM

## 2024-04-27 DIAGNOSIS — I4891 Unspecified atrial fibrillation: Secondary | ICD-10-CM | POA: Diagnosis not present

## 2024-04-27 DIAGNOSIS — D6869 Other thrombophilia: Secondary | ICD-10-CM | POA: Diagnosis not present

## 2024-04-27 NOTE — Patient Instructions (Signed)
 Echocardiogram -- scheduling will call once insurance authorization received.

## 2024-04-27 NOTE — Progress Notes (Signed)
 Primary Care Physician: Rilla Baller, MD Primary Cardiologist: None Electrophysiologist: None     Referring Physician: Dr. Rilla Mcintosh Rachel Colvin is a 68 y.o. female with a history of COPD, pulmonary hypertension, aortic atherosclerosis, HTN, and atrial fibrillation who presents for consultation in the [facility]. Noted by PCP on 02/18/24 to be likely Afib with RVR. Cardiac monitor placed by PCP showed 19% Afib burden. Patient is on Eliquis  5 mg BID for a CHADS2VASC score of 4.  On evaluation today, patient is currently in NSR. Patient is taking Toprol  25 mg daily and Eliquis  5 mg BID. She notes recent significant stress related to mother's health.    Today, she denies symptoms of chest pain, shortness of breath, orthopnea, PND, lower extremity edema, dizziness, presyncope, syncope, snoring, daytime somnolence, bleeding, or neurologic sequela. The patient is tolerating medications without difficulties and is otherwise without complaint today.    Atrial Fibrillation Risk Factors:  she does have symptoms or diagnosis of sleep apnea.  she has a BMI of Body mass index is 25.73 kg/m.SABRA Filed Weights   04/27/24 0956  Weight: 72.3 kg    Current Outpatient Medications  Medication Sig Dispense Refill   albuterol  (VENTOLIN  HFA) 108 (90 Base) MCG/ACT inhaler Inhale 2 puffs into the lungs every 6 (six) hours as needed for wheezing or shortness of breath. 8 g 3   alendronate  (FOSAMAX ) 70 MG tablet Take 1 tablet (70 mg total) by mouth every 7 (seven) days. Take with a full glass of water on an empty stomach. 4 tablet 11   ALPRAZolam  (XANAX ) 1 MG tablet TAKE 1 TABLET BY MOUTH TWICE A DAY AS NEEDED FOR ANXIETY 30 tablet 0   apixaban  (ELIQUIS ) 5 MG TABS tablet Take 1 tablet (5 mg total) by mouth 2 (two) times daily. 60 tablet 3   Calcium  Carbonate-Vitamin D3 (CALCIUM  600/VITAMIN D ) 600-400 MG-UNIT TABS Take 1 tablet by mouth daily.     Cholecalciferol  (VITAMIN D3) 25 MCG (1000 UT) CAPS Take 1 capsule (1,000 Units total) by mouth daily.     metoprolol  succinate (TOPROL -XL) 25 MG 24 hr tablet Take 1 tablet (25 mg total) by mouth daily. 90 tablet 1   omeprazole  (PRILOSEC) 20 MG capsule Take 1 capsule (20 mg total) by mouth daily.     valACYclovir  (VALTREX ) 1000 MG tablet TAKE 2 TABLETS BY MOUTH TWICE DAILY FOR  1  DAY  PER  FLARE 20 tablet 3   vitamin B-12 (V-R VITAMIN B-12) 500 MCG tablet Take 1 tablet (500 mcg total) by mouth 3 (three) times a week.     No current facility-administered medications for this encounter.    Atrial Fibrillation Management history:  Previous antiarrhythmic drugs: none Previous cardioversions: none Previous ablations: none Anticoagulation history: Eliquis    ROS- All systems are reviewed and negative except as per the HPI above.  Physical Exam: BP (!) 160/100   Pulse 73   Ht 5' 6 (1.676 m)   Wt 72.3 kg   BMI 25.73 kg/m   GEN: Well nourished, well developed in no acute distress NECK: No JVD; No carotid bruits CARDIAC: Regular rate and rhythm, no murmurs, rubs, gallops RESPIRATORY:  Clear to auscultation without rales, wheezing or rhonchi  ABDOMEN: Soft, non-tender, non-distended EXTREMITIES:  No edema; No deformity   EKG today demonstrates  Vent. rate 73 BPM PR interval 144 ms QRS duration 80 ms QT/QTcB 394/434 ms P-R-T axes 78 77 71 Normal sinus rhythm  Cannot rule out Anterior infarct , age undetermined Abnormal ECG When compared with ECG of 26-Jul-2015 20:27, PREVIOUS ECG IS PRESENT  Echo 2016 showed normal function  ASSESSMENT & PLAN CHA2DS2-VASc Score = 4  The patient's score is based upon: CHF History: 0 HTN History: 1 Diabetes History: 0 Stroke History: 0 Vascular Disease History: 1 Age Score: 1 Gender Score: 1       ASSESSMENT AND PLAN: Paroxysmal Atrial Fibrillation (ICD10:  I48.0) The patient's CHA2DS2-VASc score is 4, indicating a 4.8% annual risk of stroke.     Patient is currently in NSR. Education provided about Afib. Discussion about triggers for Afib. Discussion about medication treatments and ablation going forward if indicated. Patient confused as to whether should keep appointment with general cardiologist. I offered patient the choice of keeping appointment or canceling. Due to risk factors, it would be reasonable to keep to establish care. We discussed her cardiac monitor showing 19% Afib burden. We had a discussion about medication treatments and ablation in detail. We talked about the monitoring required for these medications and potential adverse effects. We also discussed pulse field ablation as an option in the form of intervention. After discussion, patient is interested in speaking with EP regarding potential for Afib ablation. She notes frustration at there being multiple doctors for different things. I advised patient referral to EP would be specifically to discuss Afib management via ablation for rhythm control. Continue Toprol  25 mg daily. Will order updated echocardiogram. Patient will think over whether will keep or cancel appointment with general cardiologist.    Secondary Hypercoagulable State (ICD10:  [phone]) The patient is at significant risk for stroke/thromboembolism based upon her CHA2DS2-VASc Score of 4.  Continue Apixaban  (Eliquis ).  We discussed the reasoning behind anticoagulation in the setting of stroke prevention related to Afib. We discussed the benefits vs risks of anticoagulation. After discussion, patient will continue Eliquis  5 mg BID.    Referral to EP to discuss ablation.   Terra Pac, PA-C  Afib Clinic [facility] [address] [place], [place] [address] [phone]

## 2024-04-28 ENCOUNTER — Ambulatory Visit (HOSPITAL_COMMUNITY)
Admission: RE | Admit: 2024-04-28 | Discharge: 2024-04-28 | Disposition: A | Discharge status: Home / Self Care | Source: Ambulatory Visit | Attending: Cardiology | Admitting: Cardiology

## 2024-04-28 DIAGNOSIS — I480 Paroxysmal atrial fibrillation: Secondary | ICD-10-CM | POA: Insufficient documentation

## 2024-04-28 LAB — ECHOCARDIOGRAM COMPLETE
Area-P 1/2: 3.14 cm2
P 1/2 time: 259 ms
S' Lateral: 1.6 cm

## 2024-05-02 ENCOUNTER — Encounter: Payer: Self-pay | Admitting: Family Medicine

## 2024-05-02 ENCOUNTER — Ambulatory Visit (HOSPITAL_COMMUNITY): Payer: Self-pay | Admitting: Internal Medicine

## 2024-05-04 NOTE — Telephone Encounter (Signed)
 Copied from CRM [id number]. Topic: Clinical - Order For Equipment >> May 04, 2024  4:24 PM Jasmin G wrote: Reason for CRM: Pt called to check on the status of her request for a walker placed on 9/2, please refer to recent Encounters, I relayed info seen, please call pt back at [phone] to give her a status on request as she is an amputee and needs the walker.

## 2024-05-05 NOTE — Telephone Encounter (Signed)
 Rx written and in Amy's out box.

## 2024-05-08 NOTE — Telephone Encounter (Signed)
 Pt came by and picked up rx form.

## 2024-05-10 ENCOUNTER — Other Ambulatory Visit: Payer: Self-pay | Admitting: Family Medicine

## 2024-05-19 NOTE — Progress Notes (Signed)
 Cardiology Office Note:  .   Date:  05/19/2024  ID:  Rachel Mcintosh, DOB 1955-12-02, MRN [id number] PCP: Rilla Baller, MD  [facility] Providers Cardiologist:  Newman Lawrence, MD PCP: Rilla Baller, MD  Chief Complaint  Patient presents with   Paroxysmal atrial fibrillation     Rachel Mcintosh is a 68 y.o. female with hypertension, mixed hyperlipidemia, aortic atherosclerosis, COPD, paroxysmal A-fib, s/p Lt AKA, h/o DVT  Discussed the use of AI scribe software for clinical note transcription with the patient, who gave verbal consent to proceed.  History of Present Illness Patient lives alone, does own ADLs independently. Patient was recently diagnosed with A-fib after Zio patch placed by her PCP Dr. Rilla.  Patient was seen in A-fib clinic last month.  She was started on metoprolol  succinate 25 mg daily, Eliquis  5 mg twice daily, was also referred to EP for discussion regarding A-fib ablation.  She experiences episodes of rapid heartbeat due to atrial fibrillation, confirmed by a two-week heart monitor showing 19% occurrence. She is on Eliquis  and metoprolol  25 mg for rate control and stroke prevention. She denies chest pain or shortness of breath at rest but experiences shortness of breath with walking.  Her physical activity is limited due to a left leg amputation from an accident in 2008. She engages in minimal physical activity, mainly shopping and attending family functions. She quit smoking in 2016 and consumes two cups of coffee and one Pepsi daily. She denies regular alcohol use and follows a Mediterranean-style diet.  She has COPD/emphysema, attributed to past smoking, and is monitored with lung scans. She experiences shortness of breath with exertion. Her cholesterol levels have been elevated since at least 2017, with a total cholesterol of 258 mg/dL and LDL of 832 mg/dL as of June this year. She has not been on cholesterol-lowering medications due  to concerns about statins.  Chart review shows history of pulmonary hypertension.  On further chart review, it appears that echocardiogram in 2016 had showed peak PA pressure of 42 mmHg.  However, more recent echocardiogram in 03/2024 did not suggest any pulmonary hypertension, RV size and function were normal.  Vitals:   05/29/24 0851  BP: 136/74  Pulse: 65  SpO2: 95%      Review of Systems  Cardiovascular:  Positive for palpitations. Negative for chest pain, dyspnea on exertion, leg swelling and syncope.        Studies Reviewed: SABRA        EKG 05/29/2024: Normal sinus rhythm Normal ECG When compared with ECG of 27-Apr-2024 10:00, No significant change was found     Echocardiogram 03/2024: 1. Left ventricular ejection fraction, by estimation, is 70 to 75%. Left  ventricular ejection fraction by 3D volume is 73 %. The left ventricle has  normal function. The left ventricle has no regional wall motion  abnormalities. Left ventricular diastolic   parameters were normal.   2. Right ventricular systolic function is normal. The right ventricular  size is normal.   3. The mitral valve is normal in structure. Mild mitral valve  regurgitation.   4. The aortic valve is tricuspid. Aortic valve regurgitation is mild.   5. The inferior vena cava is normal in size with greater than 50%  respiratory variability, suggesting right atrial pressure of 3 mmHg.   Zio patch 03/2024:  HR 44 to 220, average 76. 19% burden of atrial fibrillation, average HR 97 bpm.  CT chest 10/2023: 1. Lung-RADS 2, benign appearance or behavior.  Continue annual screening with low-dose chest CT without contrast in 12 months. 2. Probable chronic mycobacterium avium complex. 3. Right adrenal adenoma. 4.  Aortic atherosclerosis (ICD10-I70.0). 5.  Emphysema (ICD10-J43.9).      Labs 01/2024: Chol 258, TG 120, HDL 66, LDL 167 Hb 14.5 Cr 0.99 TSH 0.67  Risk Assessment/Calculations:    CHA2DS2-VASc Score =  4  This indicates a 4.8% annual risk of stroke. The patient's score is based upon: CHF History: 0 HTN History: 1 Diabetes History: 0 Stroke History: 0 Vascular Disease History: 1 Age Score: 1 Gender Score: 1    Physical Exam Vitals and nursing note reviewed.  Constitutional:      General: She is not in acute distress. Neck:     Vascular: No JVD.  Cardiovascular:     Rate and Rhythm: Normal rate and regular rhythm.     Heart sounds: Normal heart sounds. No murmur heard. Pulmonary:     Effort: Pulmonary effort is normal.     Breath sounds: Normal breath sounds. No wheezing or rales.  Musculoskeletal:     Comments: Left AKA      VISIT DIAGNOSES:   ICD-10-CM   1. Paroxysmal atrial fibrillation (HCC)  I48.0 EKG 12-Lead    2. Mixed hyperlipidemia  E78.2     3. Aortic atherosclerosis  I70.0        Rachel Mcintosh is a 68 y.o. female with hypertension, mixed hyperlipidemia, aortic atherosclerosis, COPD, paroxysmal A-fib, s/p Lt AKA, h/o DVT Assessment & Plan Paroxysmal atrial fibrillation: Symptomatic with palpitations, 19% A-fib burden on recent ZIO monitor. Continue metoprolol  succinate 25 mg daily, Eliquis  5 mg twice daily. Blood pressure slightly elevated today, but generally well-controlled.  In future, consider adding ARB for hypertension management, if needed. Keep appointment with Dr. Nancey to discuss A-fib ablation. Refer to sleep study, given snoring and PAF.  Mixed hyperlipidemia: Chronic mixed hyperlipidemia with total cholesterol 258 and LDL 167.  Patient is reluctant to start statin with concerns for dementia.  I explained to the patient that benefits of statin far outweigh the risks in her situation, with no cause-and-effect relationship between statin and dementia.  In fact, statins can reduce risk of vascular dementia.  For now, patient would like to avoid statin.  Restarted Zetia 10 mg daily.  She will get repeat lipid panel with PCP at next physical  next month.   Given that she may be getting CT scan with Dr. Nancey prior to possible A-fib ablation, calcium  score can be reviewed on that test for further risk stratification. Discussed heart healthy diet and lifestyle.    Meds ordered this encounter  Medications   ezetimibe (ZETIA) 10 MG tablet    Sig: Take 1 tablet (10 mg total) by mouth daily.    Dispense:  90 tablet    Refill:  3     F/u in 6 months  Signed, Newman JINNY Lawrence, MD

## 2024-05-25 ENCOUNTER — Other Ambulatory Visit (HOSPITAL_COMMUNITY): Payer: Self-pay | Admitting: *Deleted

## 2024-05-25 DIAGNOSIS — I480 Paroxysmal atrial fibrillation: Secondary | ICD-10-CM

## 2024-05-29 ENCOUNTER — Ambulatory Visit: Attending: Cardiology | Admitting: Cardiology

## 2024-05-29 ENCOUNTER — Encounter: Payer: Self-pay | Admitting: Cardiology

## 2024-05-29 VITALS — BP 136/74 | HR 65 | Ht 65.0 in | Wt 157.0 lb

## 2024-05-29 DIAGNOSIS — I700 Atherosclerosis of aorta: Secondary | ICD-10-CM | POA: Diagnosis not present

## 2024-05-29 DIAGNOSIS — I480 Paroxysmal atrial fibrillation: Secondary | ICD-10-CM | POA: Diagnosis not present

## 2024-05-29 DIAGNOSIS — E782 Mixed hyperlipidemia: Secondary | ICD-10-CM

## 2024-05-29 MED ORDER — EZETIMIBE 10 MG PO TABS: 10.0000 mg | ORAL_TABLET | Freq: Every day | ORAL | 3 refills | Status: AC

## 2024-05-29 NOTE — Patient Instructions (Signed)
 Medication Instructions:  START Zetia 10 mg daily  *If you need a refill on your cardiac medications before your next appointment, please call your pharmacy*  Follow-Up: At [facility], you and your health needs are our priority.  As part of our continuing mission to provide you with exceptional heart care, our providers are all part of one team.  This team includes your primary Cardiologist (physician) and Advanced Practice Providers or APPs (Physician Assistants and Nurse Practitioners) who all work together to provide you with the care you need, when you need it.  Your next appointment:   6 month(s)  Provider:   Newman JINNY Lawrence, MD

## 2024-05-31 ENCOUNTER — Other Ambulatory Visit (HOSPITAL_COMMUNITY)

## 2024-06-04 ENCOUNTER — Other Ambulatory Visit: Payer: Self-pay | Admitting: Family Medicine

## 2024-06-04 DIAGNOSIS — M810 Age-related osteoporosis without current pathological fracture: Secondary | ICD-10-CM

## 2024-06-04 DIAGNOSIS — E7800 Pure hypercholesterolemia, unspecified: Secondary | ICD-10-CM

## 2024-06-04 DIAGNOSIS — E538 Deficiency of other specified B group vitamins: Secondary | ICD-10-CM

## 2024-06-04 DIAGNOSIS — I10 Essential (primary) hypertension: Secondary | ICD-10-CM

## 2024-06-06 NOTE — Progress Notes (Unsigned)
 Electrophysiology Office Note:    Date:  06/07/2024   ID:  Rachel, Mcintosh 28-Sep-1955, MRN [id number]  PCP:  Rilla Baller, MD   [facility] Providers Cardiologist:  Newman JINNY Lawrence, MD     Referring MD: Terra Fairy JINNY, PA-C   History of Present Illness:    Rachel Mcintosh is a 68 y.o. female with a medical history significant for paroxysmal atrial fibrillation, COPD, aortic atherosclerosis referred for atrial fibrillation management.       History of Present Illness  The patient complained of palpitations and wore a monitor in November 2024 that showed brief nonsustained SVT.    In clinic in follow-up on February 18, 2024, she was noted to have a rapid and irregular rhythm.  Twelve-lead EKG showed atrial fibrillation, and a monitor was placed that documented a 19% burden of A-fib.  Rates were overall controlled with an average rate of less than 100 bpm, but at times her rates peak to above 220 bpm.  She was often symptomatic with atrial fibrillation.  She does have occasional falls because she has a left BKA.      Today, she reports that she feels well and has no complaints  EKGs/Labs/Other Studies Reviewed Today:     Echocardiogram:  TTE April 28, 2024 LVEF 70 to 75%.  Normal RV function.  Normal mitral valve structure and function.   Monitors:  14 day monitor July 2025 -- my interpretation Sinus rhythm heart rate 44 to 129 bpm, 72 BPM average 19% burden of atrial fibrillation, rates 56 to 220 bpm, average 97 bpm Less than 1% ectopy burden Symptoms correlate with atrial fibrillation  EKG:   EKG Interpretation Date/Time:  Wednesday June 07 2024 10:54:47 EDT Ventricular Rate:  70 PR Interval:  162 QRS Duration:  80 QT Interval:  406 QTC Calculation: 438 R Axis:   82  Text Interpretation: Normal sinus rhythm Normal ECG When compared with ECG of 29-May-2024 08:53, No significant change was found Confirmed by Nancey Scotts  [phone]) on 06/07/2024 11:02:46 AM     Physical Exam:    VS:  BP 135/81 (BP Location: Right Arm, Patient Position: Sitting, Cuff Size: Normal)   Pulse 70   Ht 5' 5 (1.651 m)   Wt 157 lb (71.2 kg)   SpO2 96%   BMI 26.13 kg/m     Wt Readings from Last 3 Encounters:  06/07/24 157 lb (71.2 kg)  05/29/24 157 lb (71.2 kg)  04/27/24 159 lb 6.4 oz (72.3 kg)     GEN: Well nourished, well developed in no acute distress CARDIAC: RRR, no murmurs, rubs, gallops RESPIRATORY:  Normal work of breathing MUSCULOSKELETAL: no edema    ASSESSMENT & PLAN:     Paroxysmal atrial fibrillation Symptomatic with rapid palpitations 2-week monitor showed 19% A-fib burden We discussed management options including conservative approach, rhythm control with intermittent drugs, or ablation.  I recommended ablation, and using a shared decision making approach decided to schedule the procedure.  We discussed the indication, rationale, logistics, anticipated benefits, and potential risks of the ablation procedure including but not limited to -- bleed at the groin access site, chest pain, damage to nearby organs such as the diaphragm, lungs, or esophagus, need for a drainage tube, or prolonged hospitalization. I explained that the risk for stroke, heart attack, need for open chest surgery, or even death is very low but not zero. she  expressed understanding and wishes to proceed.   Secondary hypercoagulable state CHA2DS2-VASc  score is 4 Continue Eliquis  5 mg twice daily I would be more proactive discontinuing Eliquis  because of her fall risk with left BKA  COPD Former smoker  Hypertension Blood pressure reasonably controlled today   Signed, Eulas FORBES Furbish, MD  06/07/2024 11:17 AM

## 2024-06-07 ENCOUNTER — Ambulatory Visit: Attending: Cardiology | Admitting: Cardiovascular Disease

## 2024-06-07 ENCOUNTER — Encounter: Payer: Self-pay | Admitting: Cardiovascular Disease

## 2024-06-07 VITALS — BP 135/81 | HR 70 | Ht 65.0 in | Wt 157.0 lb

## 2024-06-07 DIAGNOSIS — Z01812 Encounter for preprocedural laboratory examination: Secondary | ICD-10-CM | POA: Diagnosis not present

## 2024-06-07 DIAGNOSIS — I4819 Other persistent atrial fibrillation: Secondary | ICD-10-CM | POA: Diagnosis not present

## 2024-06-07 NOTE — Addendum Note (Signed)
 Addended by: CASIMIR ALDONA BRAVO on: 06/07/2024 11:44 AM   Modules accepted: Orders

## 2024-06-07 NOTE — Patient Instructions (Addendum)
 Medication Instructions:  Your physician recommends that you continue on your current medications as directed. Please refer to the Current Medication list given to you today.  *If you need a refill on your cardiac medications before your next appointment, please call your pharmacy*  Testing/Procedures: Ablation Your physician has recommended that you have an ablation. Catheter ablation is a medical procedure used to treat some cardiac arrhythmias (irregular heartbeats). During catheter ablation, a long, thin, flexible tube is put into a blood vessel in your groin (upper thigh), or neck. This tube is called an ablation catheter. It is then guided to your heart through the blood vessel. Radio frequency waves destroy small areas of heart tissue where abnormal heartbeats may cause an arrhythmia to start.   You are scheduled for Atrial Fibrillation Ablation on Thursday, December 4 with Dr. Dr. Nancey. Please arrive at the Main Entrance A at [facility]: [address] [place], [place] [address] at 8:00 AM   What To Expect: Monday, 07/17/2024 Labs: you will need to have lab work drawn within 30 days of your procedure. Please go to any [facility] location to have these drawn - no appointment is needed.  You will receive procedure instructions either through MyChart or in the mail 4-6 week prior to your procedure.  After your procedure we recommend no driving for 4 days, no lifting over 5 lbs for 7 days, and no work or strenuous activity for 7 days.  Please contact our office at [phone] if you have any questions.    Follow-Up: We will contact you to schedule your post-procedure appointments.

## 2024-06-12 ENCOUNTER — Other Ambulatory Visit (INDEPENDENT_AMBULATORY_CARE_PROVIDER_SITE_OTHER): Payer: Medicare HMO

## 2024-06-12 DIAGNOSIS — I10 Essential (primary) hypertension: Secondary | ICD-10-CM | POA: Diagnosis not present

## 2024-06-12 DIAGNOSIS — E538 Deficiency of other specified B group vitamins: Secondary | ICD-10-CM

## 2024-06-12 DIAGNOSIS — E7800 Pure hypercholesterolemia, unspecified: Secondary | ICD-10-CM

## 2024-06-12 DIAGNOSIS — M810 Age-related osteoporosis without current pathological fracture: Secondary | ICD-10-CM | POA: Diagnosis not present

## 2024-06-12 LAB — LIPID PANEL
Cholesterol: 211 mg/dL — ABNORMAL HIGH (ref 0–200)
HDL: 51.4 mg/dL (ref >39.00)
LDL Cholesterol: 116 mg/dL — ABNORMAL HIGH (ref 0–99)
NonHDL: 159.3
Total CHOL/HDL Ratio: 4
Triglycerides: 216 mg/dL — ABNORMAL HIGH (ref 0.0–149.0)
VLDL: 43.2 mg/dL — ABNORMAL HIGH (ref 0.0–40.0)

## 2024-06-12 LAB — COMPREHENSIVE METABOLIC PANEL WITH GFR
ALT: 19 U/L (ref 0–35)
AST: 16 U/L (ref 0–37)
Albumin: 4.5 g/dL (ref 3.5–5.2)
Alkaline Phosphatase: 53 U/L (ref 39–117)
BUN: 9 mg/dL (ref 6–23)
CO2: 29 meq/L (ref 19–32)
Calcium: 9 mg/dL (ref 8.4–10.5)
Chloride: 103 meq/L (ref 96–112)
Creatinine, Ser: 0.62 mg/dL (ref 0.40–1.20)
GFR: 93.12 mL/min (ref >60.00)
Glucose, Bld: 100 mg/dL — ABNORMAL HIGH (ref 70–99)
Potassium: 3.5 meq/L (ref 3.5–5.1)
Sodium: 141 meq/L (ref 135–145)
Total Bilirubin: 0.5 mg/dL (ref 0.2–1.2)
Total Protein: 6.7 g/dL (ref 6.0–8.3)

## 2024-06-12 LAB — MICROALBUMIN / CREATININE URINE RATIO
Creatinine,U: 171.2 mg/dL
Microalb Creat Ratio: 9.6 mg/g (ref 0.0–30.0)
Microalb, Ur: 1.6 mg/dL (ref 0.0–1.9)

## 2024-06-12 LAB — VITAMIN D 25 HYDROXY (VIT D DEFICIENCY, FRACTURES): VITD: 31 ng/mL (ref 30.00–100.00)

## 2024-06-12 LAB — VITAMIN B12: Vitamin B-12: 745 pg/mL (ref 211–911)

## 2024-06-13 ENCOUNTER — Ambulatory Visit: Payer: Self-pay | Admitting: Family Medicine

## 2024-06-17 ENCOUNTER — Other Ambulatory Visit: Payer: Self-pay | Admitting: Family Medicine

## 2024-06-20 ENCOUNTER — Encounter: Payer: Self-pay | Admitting: Family Medicine

## 2024-06-20 ENCOUNTER — Ambulatory Visit: Payer: Medicare HMO | Admitting: Family Medicine

## 2024-06-20 VITALS — BP 122/78 | HR 64 | Temp 99.2°F | Ht 65.5 in | Wt 160.5 lb

## 2024-06-20 DIAGNOSIS — I480 Paroxysmal atrial fibrillation: Secondary | ICD-10-CM | POA: Diagnosis not present

## 2024-06-20 DIAGNOSIS — E538 Deficiency of other specified B group vitamins: Secondary | ICD-10-CM | POA: Diagnosis not present

## 2024-06-20 DIAGNOSIS — K219 Gastro-esophageal reflux disease without esophagitis: Secondary | ICD-10-CM | POA: Diagnosis not present

## 2024-06-20 DIAGNOSIS — I700 Atherosclerosis of aorta: Secondary | ICD-10-CM | POA: Diagnosis not present

## 2024-06-20 DIAGNOSIS — J439 Emphysema, unspecified: Secondary | ICD-10-CM

## 2024-06-20 DIAGNOSIS — E7800 Pure hypercholesterolemia, unspecified: Secondary | ICD-10-CM | POA: Diagnosis not present

## 2024-06-20 DIAGNOSIS — Z87891 Personal history of nicotine dependence: Secondary | ICD-10-CM | POA: Diagnosis not present

## 2024-06-20 DIAGNOSIS — Z0000 Encounter for general adult medical examination without abnormal findings: Secondary | ICD-10-CM | POA: Diagnosis not present

## 2024-06-20 DIAGNOSIS — S78112A Complete traumatic amputation at level between left hip and knee, initial encounter: Secondary | ICD-10-CM

## 2024-06-20 DIAGNOSIS — R0602 Shortness of breath: Secondary | ICD-10-CM

## 2024-06-20 DIAGNOSIS — B001 Herpesviral vesicular dermatitis: Secondary | ICD-10-CM

## 2024-06-20 DIAGNOSIS — Z7189 Other specified counseling: Secondary | ICD-10-CM

## 2024-06-20 DIAGNOSIS — M810 Age-related osteoporosis without current pathological fracture: Secondary | ICD-10-CM

## 2024-06-20 DIAGNOSIS — I10 Essential (primary) hypertension: Secondary | ICD-10-CM | POA: Diagnosis not present

## 2024-06-20 DIAGNOSIS — N3946 Mixed incontinence: Secondary | ICD-10-CM | POA: Diagnosis not present

## 2024-06-20 MED ORDER — APIXABAN 5 MG PO TABS: 5.0000 mg | ORAL_TABLET | Freq: Two times a day (BID) | ORAL | 11 refills | Status: AC

## 2024-06-20 MED ORDER — ALPRAZOLAM 1 MG PO TABS: ORAL_TABLET | ORAL | 0 refills | Status: AC

## 2024-06-20 MED ORDER — ALENDRONATE SODIUM 70 MG PO TABS: 70.0000 mg | ORAL_TABLET | ORAL | 3 refills | Status: AC

## 2024-06-20 MED ORDER — METOPROLOL SUCCINATE ER 25 MG PO TB24: 25.0000 mg | ORAL_TABLET | Freq: Every day | ORAL | 3 refills | Status: AC

## 2024-06-20 MED ORDER — VALACYCLOVIR HCL 1 G PO TABS: 2000.0000 mg | ORAL_TABLET | Freq: Two times a day (BID) | ORAL | 3 refills | Status: AC

## 2024-06-20 MED ORDER — ALBUTEROL SULFATE HFA 108 (90 BASE) MCG/ACT IN AERS: 2.0000 | INHALATION_SPRAY | Freq: Four times a day (QID) | RESPIRATORY_TRACT | 3 refills | Status: AC | PRN

## 2024-06-20 MED ORDER — CYANOCOBALAMIN 500 MCG PO TABS: 500.0000 ug | ORAL_TABLET | ORAL | Status: AC

## 2024-06-20 NOTE — Assessment & Plan Note (Signed)
Continue yearly lung cancer screening CT

## 2024-06-20 NOTE — Assessment & Plan Note (Signed)
 Continue zetia.

## 2024-06-20 NOTE — Patient Instructions (Addendum)
 Consider updated pneumonia shot  Medicines refilled today. Good to see you today  Return as needed or in 1 year for next physical

## 2024-06-20 NOTE — Assessment & Plan Note (Signed)
 Endorses both stress and urge symptoms - overall not bothersome - will continue to monitor. Previously discussed kegel exercises.

## 2024-06-20 NOTE — Assessment & Plan Note (Signed)
 Chronic, improving readings now on daily zetia.  The 10-year ASCVD risk score (Arnett DK, et al., 2019) is: 7.2%   Values used to calculate the score:     Age: 68 years     Clincally relevant sex: Female     Is Non-Hispanic African American: No     Diabetic: No     Tobacco smoker: No     Systolic Blood Pressure: 122 mmHg     Is BP treated: Yes     HDL Cholesterol: 51.4 mg/dL     Total Cholesterol: 211 mg/dL

## 2024-06-20 NOTE — Assessment & Plan Note (Signed)
 Valtrex refilled to use PRN

## 2024-06-20 NOTE — Assessment & Plan Note (Signed)
Continues oral b12 replacement.

## 2024-06-20 NOTE — Assessment & Plan Note (Signed)
 Stable period on PRN albuterol . Previous trial incruse ellipta  without significant benefit 2023 Quit smoking 2016.

## 2024-06-20 NOTE — Assessment & Plan Note (Signed)
 Preventative protocols reviewed and updated unless pt declined. Discussed healthy diet and lifestyle.
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
## 2024-06-20 NOTE — Assessment & Plan Note (Addendum)
 Appreciate cardiology and EP care - discussing afib ablation 07/2024 Continue Toprol  XL, eliquis .

## 2024-06-20 NOTE — Assessment & Plan Note (Signed)
Advanced directive - scanned into chart 03/2018. Daughter Heather Neal would be HCPOA. ok with life support/code, but doesn not want prolonged life support if terminal condition.  

## 2024-06-20 NOTE — Assessment & Plan Note (Signed)
 BP stable on current regimen (metoprolol  succinate)

## 2024-06-20 NOTE — Assessment & Plan Note (Signed)
Stable period on omeprazole '20mg'$ 

## 2024-06-20 NOTE — Assessment & Plan Note (Signed)
 Continue fosamax  (started 2024), regular calcium , vit d and regular weight bearing exercises.  Rpt DEXA next march

## 2024-06-27 ENCOUNTER — Ambulatory Visit: Payer: Self-pay

## 2024-06-27 NOTE — Telephone Encounter (Signed)
 Appreciate Dr B seeing this pleasant pt

## 2024-06-27 NOTE — Telephone Encounter (Signed)
 FYI Only or Action Required?: Action required by provider: request for appointment.  Patient was last seen in primary care on 06/20/2024 by Rilla Baller, MD.  Called Nurse Triage reporting No chief complaint on file..  Symptoms began a week ago.  Interventions attempted: Prescription medications: Flonase  and Rest, hydration, or home remedies.  Symptoms are: gradually worsening.  Triage Disposition: See PCP When Office is Open (Within 3 Days)  Patient/caregiver understands and will follow disposition?: Yes  Copied from CRM [id number]. Topic: Clinical - Red Word Triage >> Jun 27, 2024  8:05 AM Rea BROCKS wrote: Red Word that prompted transfer to Nurse Triage: Chest feels like someone stomped on it, sore. Sore throat on, right side pain when swallowing, severe cough. Chest tightness. When patient takes a deep breath but there's a dull pain. Reason for Disposition  [1] Sore throat with cough/cold symptoms AND [2] present > 5 days  Answer Assessment - Initial Assessment Questions 1. ONSET: When did the throat start hurting? (Hours or days ago)      A week ago  2. SEVERITY: How bad is the sore throat? (Scale 1-10; mild, moderate or severe)     Mild  3. STREP EXPOSURE: Has there been any exposure to strep within the past week? If Yes, ask: What type of contact occurred?      No  4.  VIRAL SYMPTOMS: Are there any symptoms of a cold, such as a runny nose, cough, hoarse voice or red eyes?      Cough, Runny Nose,   5. FEVER: Do you have a fever? If Yes, ask: What is your temperature, how was it measured, and when did it start?     No  6. PUS ON THE TONSILS: Is there pus on the tonsils in the back of your throat?     No  7. OTHER SYMPTOMS: Do you have any other symptoms? (e.g., difficulty breathing, headache, rash)     Pain with Inspiration  8. PREGNANCY: Is there any chance you are pregnant? When was your last menstrual period?     No and No  Protocols used:  Sore Throat-A-AH

## 2024-06-28 ENCOUNTER — Ambulatory Visit: Admitting: Family Medicine

## 2024-06-28 ENCOUNTER — Encounter: Payer: Self-pay | Admitting: Family Medicine

## 2024-06-28 VITALS — BP 134/84 | HR 75 | Temp 98.2°F | Ht 65.5 in | Wt 162.0 lb

## 2024-06-28 DIAGNOSIS — J029 Acute pharyngitis, unspecified: Secondary | ICD-10-CM | POA: Diagnosis not present

## 2024-06-28 DIAGNOSIS — R051 Acute cough: Secondary | ICD-10-CM | POA: Diagnosis not present

## 2024-06-28 LAB — POC COVID19 BINAXNOW: SARS Coronavirus 2 Ag: NEGATIVE

## 2024-06-28 LAB — POCT INFLUENZA A/B
Influenza A, POC: NEGATIVE
Influenza B, POC: NEGATIVE

## 2024-06-28 LAB — POCT RAPID STREP A (OFFICE): Rapid Strep A Screen: NEGATIVE

## 2024-06-28 MED ORDER — AZITHROMYCIN 250 MG PO TABS: ORAL_TABLET | ORAL | 0 refills | Status: AC

## 2024-06-28 MED ORDER — PREDNISONE 20 MG PO TABS: ORAL_TABLET | ORAL | 0 refills | Status: DC

## 2024-06-28 NOTE — Progress Notes (Signed)
 Patient ID: Rachel Mcintosh, female    DOB: [birth year], 68 y.o.   MRN: [id number]  This visit was conducted in person.  BP 134/84   Pulse 75   Temp 98.2 F (36.8 C) (Oral)   Ht 5' 5.5 (1.664 m)   Wt 162 lb (73.5 kg)   SpO2 96%   BMI 26.55 kg/m    CC:  Chief Complaint  Patient presents with   Cough    C/o cough, ST, body aches, fatigue and R side of neck. Sxs started about 1 wk ago.     Subjective:   HPI: Rachel Mcintosh is a 68 y.o. female presenting on 06/28/2024 for Cough (C/o cough, ST, body aches, fatigue and R side of neck. Sxs started about 1 wk ago. )   Date of onset:  1 week Initial symptoms included  dry cough, sneeze, ST Symptoms progressed to Headache, no ear pain  Bilateral face pain  Chest wall soreness.  SOB with exertion, no wheeze.   Sick contacts:  COVID testing:   none     She has tried to treat with  flonase        History COPD Former smoker.      Relevant past medical, surgical, family and social history reviewed and updated as indicated. Interim medical history since our last visit reviewed. Allergies and medications reviewed and updated. Outpatient Medications Prior to Visit  Medication Sig Dispense Refill   albuterol  (VENTOLIN  HFA) 108 (90 Base) MCG/ACT inhaler Inhale 2 puffs into the lungs every 6 (six) hours as needed for wheezing or shortness of breath. 8 g 3   alendronate  (FOSAMAX ) 70 MG tablet Take 1 tablet (70 mg total) by mouth once a week. Take with a full glass of water on an empty stomach. 12 tablet 3   ALPRAZolam  (XANAX ) 1 MG tablet TAKE 1 TABLET BY MOUTH TWICE A DAY AS NEEDED FOR ANXIETY 30 tablet 0   apixaban  (ELIQUIS ) 5 MG TABS tablet Take 1 tablet (5 mg total) by mouth 2 (two) times daily. 60 tablet 11   Calcium  Carbonate-Vitamin D3 (CALCIUM  600/VITAMIN D ) 600-400 MG-UNIT TABS Take 1 tablet by mouth daily.     Cholecalciferol (VITAMIN D3) 25 MCG (1000 UT) CAPS Take 1 capsule (1,000 Units total) by mouth daily.      cyanocobalamin  (V-R VITAMIN B-12) 500 MCG tablet Take 1 tablet (500 mcg total) by mouth every other day.     ezetimibe (ZETIA) 10 MG tablet Take 1 tablet (10 mg total) by mouth daily. 90 tablet 3   metoprolol  succinate (TOPROL -XL) 25 MG 24 hr tablet Take 1 tablet (25 mg total) by mouth daily. 90 tablet 3   omeprazole  (PRILOSEC) 20 MG capsule Take 1 capsule (20 mg total) by mouth daily.     valACYclovir  (VALTREX ) 1000 MG tablet Take 2 tablets (2,000 mg total) by mouth 2 (two) times daily. Per flare 20 tablet 3   No facility-administered medications prior to visit.     Per HPI unless specifically indicated in ROS section below Review of Systems  Constitutional:  Negative for fatigue and fever.  HENT:  Positive for postnasal drip, sinus pressure, sinus pain, sneezing and sore throat. Negative for congestion.   Eyes:  Negative for pain.  Respiratory:  Positive for cough. Negative for shortness of breath and wheezing.   Cardiovascular:  Negative for chest pain, palpitations and leg swelling.  Gastrointestinal:  Negative for abdominal pain.  Genitourinary:  Negative for dysuria and vaginal bleeding.  Musculoskeletal:  Negative for back pain.  Neurological:  Negative for syncope, light-headedness and headaches.  Psychiatric/Behavioral:  Negative for dysphoric mood.    Objective:  BP 134/84   Pulse 75   Temp 98.2 F (36.8 C) (Oral)   Ht 5' 5.5 (1.664 m)   Wt 162 lb (73.5 kg)   SpO2 96%   BMI 26.55 kg/m   Wt Readings from Last 3 Encounters:  06/28/24 162 lb (73.5 kg)  06/20/24 160 lb 8 oz (72.8 kg)  06/07/24 157 lb (71.2 kg)      Physical Exam Constitutional:      General: She is not in acute distress.    Appearance: She is well-developed. She is not ill-appearing or toxic-appearing.  HENT:     Head: Normocephalic.     Right Ear: Hearing, tympanic membrane, ear canal and external ear normal. Tympanic membrane is not erythematous, retracted or bulging.     Left Ear: Hearing,  tympanic membrane, ear canal and external ear normal. Tympanic membrane is not erythematous, retracted or bulging.     Nose: Mucosal edema and congestion present. No rhinorrhea.     Right Sinus: No maxillary sinus tenderness or frontal sinus tenderness.     Left Sinus: No maxillary sinus tenderness or frontal sinus tenderness.     Comments: Ethmoid sinus tenderness to palpation    Mouth/Throat:     Mouth: Mucous membranes are moist.     Pharynx: Uvula midline.  Eyes:     General: Lids are normal. Lids are everted, no foreign bodies appreciated.     Conjunctiva/sclera: Conjunctivae normal.     Pupils: Pupils are equal, round, and reactive to light.  Neck:     Thyroid : No thyroid  mass or thyromegaly.     Vascular: No carotid bruit.     Trachea: Trachea normal.  Cardiovascular:     Rate and Rhythm: Normal rate and regular rhythm.     Pulses: Normal pulses.     Heart sounds: Normal heart sounds, S1 normal and S2 normal. No murmur heard.    No friction rub. No gallop.  Pulmonary:     Effort: Pulmonary effort is normal. No tachypnea or respiratory distress.     Breath sounds: Normal breath sounds. No decreased breath sounds, wheezing, rhonchi or rales.  Musculoskeletal:     Cervical back: Normal range of motion and neck supple.  Skin:    General: Skin is warm and dry.     Findings: No rash.  Neurological:     Mental Status: She is alert.  Psychiatric:        Mood and Affect: Mood is not anxious or depressed.        Speech: Speech normal.        Behavior: Behavior normal. Behavior is cooperative.        Judgment: Judgment normal.       Results for orders placed or performed in visit on 06/28/24  POCT rapid strep A   Collection Time: 06/28/24 11:55 AM  Result Value Ref Range   Rapid Strep A Screen Negative Negative  POCT Influenza A/B   Collection Time: 06/28/24 11:59 AM  Result Value Ref Range   Influenza A, POC Negative Negative   Influenza B, POC Negative Negative  POC  COVID-19 BinaxNow   Collection Time: 06/28/24 12:00 PM  Result Value Ref Range   SARS Coronavirus 2 Ag Negative Negative    Assessment and Plan  Sore throat -     POCT rapid  strep A  Acute cough Assessment & Plan: Acute, ongoing greater than 7 to 10 days with facial pressure/pain in patient with COPD. No clear COPD exacerbation at this time. Most likely initial viral infection with some concern for possible bacterial superinfection. Will treat with prednisone  taper.  If patient's symptoms or not improving in general over the next 3 to 4 days she will fill prescription for antibiotics to cover for possible bacterial superinfection. Given multiple antibiotic allergies chose azithromycin  to treat. Return and ER precautions provided.  Orders: -     POC COVID-19 BinaxNow -     POCT Influenza A/B  Other orders -     predniSONE ; 3 tabs by mouth daily x 3 days, then 2 tabs by mouth daily x 2 days then 1 tab by mouth daily x 2 days  Dispense: 15 tablet; Refill: 0 -     Azithromycin ; Take 2 tablets on day 1, then 1 tablet daily on days 2 through 5  Dispense: 6 tablet; Refill: 0    No follow-ups on file.   Greig Ring, MD

## 2024-06-28 NOTE — Assessment & Plan Note (Addendum)
 Acute, ongoing greater than 7 to 10 days with facial pressure/pain in patient with COPD. No clear COPD exacerbation at this time. Most likely initial viral infection with some concern for possible bacterial superinfection. Will treat with prednisone  taper.  If patient's symptoms or not improving in general over the next 3 to 4 days she will fill prescription for antibiotics to cover for possible bacterial superinfection. Given multiple antibiotic allergies chose azithromycin  to treat. Return and ER precautions provided.

## 2024-07-09 ENCOUNTER — Encounter: Payer: Self-pay | Admitting: Family Medicine

## 2024-07-10 ENCOUNTER — Telehealth: Payer: Self-pay

## 2024-07-10 MED ORDER — DOXYCYCLINE HYCLATE 100 MG PO TABS: 100.0000 mg | ORAL_TABLET | Freq: Two times a day (BID) | ORAL | 0 refills | Status: DC

## 2024-07-10 NOTE — Telephone Encounter (Signed)
 Per chart review looks like it was discontinued by letvak and given but not refilled recently.

## 2024-07-10 NOTE — Telephone Encounter (Signed)
 Sent pt mychart message

## 2024-07-11 MED ORDER — OMEPRAZOLE 20 MG PO CPDR: 20.0000 mg | DELAYED_RELEASE_CAPSULE | Freq: Every day | ORAL | 1 refills | Status: AC

## 2024-07-11 NOTE — Addendum Note (Signed)
 Addended by: RILLA BALLER on: 07/11/2024 05:26 PM   Modules accepted: Orders

## 2024-07-12 ENCOUNTER — Ambulatory Visit
Admission: RE | Admit: 2024-07-12 | Discharge: 2024-07-12 | Disposition: A | Discharge status: Home / Self Care | Source: Ambulatory Visit | Attending: Family Medicine | Admitting: Family Medicine

## 2024-07-12 DIAGNOSIS — Z1231 Encounter for screening mammogram for malignant neoplasm of breast: Secondary | ICD-10-CM | POA: Diagnosis not present

## 2024-07-13 ENCOUNTER — Telehealth: Payer: Self-pay

## 2024-07-13 ENCOUNTER — Encounter (HOSPITAL_COMMUNITY): Payer: Self-pay

## 2024-07-13 ENCOUNTER — Telehealth (HOSPITAL_COMMUNITY): Payer: Self-pay

## 2024-07-13 NOTE — Telephone Encounter (Signed)
 Spoke with patient to complete pre-procedure call.     Health status review:  Any new medical conditions, recent signs of acute illness or been started on antibiotics? Pt reports she is finishing up antibiotics for a respiratory infection. Reports symptoms have improved.  Any recent hospitalizations or surgeries? No Any new medications started since pre-op visit? Doxycycline   Follow all medication instructions prior to procedure or the procedure may be rescheduled:    Continue taking Eliquis  (Apixaban ) twice daily without missing any doses before procedure. Essential chronic medications:  No medication should be continued, unless told otherwise. On the morning of your procedure DO NOT take any medication., including Eliquis  (Apixaban ).  Nothing to eat or drink after midnight prior to your procedure.  Pre-procedure testing scheduled: CT not needed and lab work by November 20.  Confirmed patient is scheduled for Atrial Fibrillation Ablation on Thursday, December 4 with Dr. Nancey. Instructed patient to arrive at the Main Entrance A at [facility]: [address] [place], [place] [address] and check in at Admitting at 7:30 AM.  Plan to go home the same day, you will only stay overnight if medically necessary. You MUST have a responsible adult to drive you home and MUST be with you the first 24 hours after you arrive home or your procedure could be cancelled.  Informed a nurse may call a day before the procedure to confirm arrival time and ensure instructions are followed.  Patient verbalized understanding to information provided and is agreeable to proceed with procedure.   Advised to contact RN Navigator at [phone], to inform of any new medications started after call or concerns prior to procedure.

## 2024-07-13 NOTE — Telephone Encounter (Signed)
-----   Message from Nurse Aldona R sent at 06/07/2024  6:22 PM EDT ----- Regarding: 12/4 Afib Ablation Mealor Important: list procedure date as first item in subject line, followed by procedure type (e.g., 05/12/24 AFib ablation)  Precert:  MD: Mealor Type of ablation: A-fib Diagnosis: Afib CPT code: A-fib (06343) Ablation scheduled (date/time): 12/4 10am  Procedure:  Added to calendar? Yes Orders entered? No, >30 days before procedure Letter complete? No, >30 days before procedure Scheduled with cath lab? Yes Any medications to hold? No Labs ordered (CBC, BMET, PT/INR if on warfarin): Yes Mapping system: CARTO (lab 4 or 6) CARTO/OPAL rep notified? No Cardiac CT needed? No Dye allergy? Yes - Lohexol Pre-meds ordered and instructions given? N/A Letter method: MyChart H&P: 10/8 Device: No  Follow-up:  Cassie/Angel, please schedule Routine.  Covering RN - please send this message to [employer], EP scheduler, EP Scheduling pool, EP [employer], and CT scheduler (Brittany Lynch/Stephanie Mogg), if indicated.
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
## 2024-07-17 ENCOUNTER — Ambulatory Visit: Payer: Self-pay | Admitting: Family Medicine

## 2024-07-17 NOTE — Telephone Encounter (Signed)
 Informed patient of provider's response. She verbalized understanding and will address any additional questions, if any, with Dr. Nancey on day of procedure.

## 2024-07-18 DIAGNOSIS — I4819 Other persistent atrial fibrillation: Secondary | ICD-10-CM | POA: Diagnosis not present

## 2024-07-18 DIAGNOSIS — Z01812 Encounter for preprocedural laboratory examination: Secondary | ICD-10-CM | POA: Diagnosis not present

## 2024-07-18 LAB — CBC

## 2024-07-19 ENCOUNTER — Ambulatory Visit: Admitting: Family Medicine

## 2024-07-19 ENCOUNTER — Ambulatory Visit: Payer: Self-pay

## 2024-07-19 LAB — BASIC METABOLIC PANEL WITH GFR
BUN/Creatinine Ratio: 16 (ref 12–28)
BUN: 10 mg/dL (ref 8–27)
CO2: 22 mmol/L (ref 20–29)
Calcium: 9.5 mg/dL (ref 8.7–10.3)
Chloride: 103 mmol/L (ref 96–106)
Creatinine, Ser: 0.62 mg/dL (ref 0.57–1.00)
Glucose: 112 mg/dL — ABNORMAL HIGH (ref 70–99)
Potassium: 4 mmol/L (ref 3.5–5.2)
Sodium: 138 mmol/L (ref 134–144)
eGFR: 98 mL/min/1.73 (ref >59)

## 2024-07-19 LAB — CBC
Hematocrit: 43.9 % (ref 34.0–46.6)
Hemoglobin: 14.5 g/dL (ref 11.1–15.9)
MCH: 28.7 pg (ref 26.6–33.0)
MCHC: 33 g/dL (ref 31.5–35.7)
MCV: 87 fL (ref 79–97)
Platelets: 344 x10E3/uL (ref 150–450)
RBC: 5.05 x10E6/uL (ref 3.77–5.28)
RDW: 12.9 % (ref 11.7–15.4)
WBC: 5.6 x10E3/uL (ref 3.4–10.8)

## 2024-07-25 ENCOUNTER — Other Ambulatory Visit (HOSPITAL_COMMUNITY): Payer: Self-pay | Admitting: *Deleted

## 2024-07-25 DIAGNOSIS — I4819 Other persistent atrial fibrillation: Secondary | ICD-10-CM

## 2024-08-02 NOTE — Pre-Procedure Instructions (Signed)
 Instructed patient on the following items: Arrival time 0530 Nothing to eat or drink after midnight No meds AM of procedure Responsible person to drive you home and stay with you for 24 hrs  Have you missed any doses of anti-coagulant Eliquis - takes twice a day, did miss a dose last week.  Don't take dose morning of procedure.  Notified Dr Nancey, will do EKG on arrival

## 2024-08-03 ENCOUNTER — Ambulatory Visit (HOSPITAL_COMMUNITY): Admitting: Anesthesiology

## 2024-08-03 ENCOUNTER — Ambulatory Visit (HOSPITAL_COMMUNITY)
Admission: RE | Admit: 2024-08-03 | Discharge: 2024-08-03 | Disposition: A | Attending: Cardiovascular Disease | Admitting: Cardiovascular Disease

## 2024-08-03 ENCOUNTER — Encounter (HOSPITAL_COMMUNITY): Admission: RE | Disposition: A | Payer: Self-pay | Source: Home / Self Care | Attending: Cardiovascular Disease

## 2024-08-03 ENCOUNTER — Other Ambulatory Visit: Payer: Self-pay

## 2024-08-03 ENCOUNTER — Ambulatory Visit (HOSPITAL_COMMUNITY)

## 2024-08-03 DIAGNOSIS — Z87891 Personal history of nicotine dependence: Secondary | ICD-10-CM | POA: Insufficient documentation

## 2024-08-03 DIAGNOSIS — Z9181 History of falling: Secondary | ICD-10-CM | POA: Diagnosis not present

## 2024-08-03 DIAGNOSIS — Z7901 Long term (current) use of anticoagulants: Secondary | ICD-10-CM | POA: Insufficient documentation

## 2024-08-03 DIAGNOSIS — J449 Chronic obstructive pulmonary disease, unspecified: Secondary | ICD-10-CM | POA: Insufficient documentation

## 2024-08-03 DIAGNOSIS — Z89512 Acquired absence of left leg below knee: Secondary | ICD-10-CM | POA: Diagnosis not present

## 2024-08-03 DIAGNOSIS — I10 Essential (primary) hypertension: Secondary | ICD-10-CM | POA: Diagnosis not present

## 2024-08-03 DIAGNOSIS — I480 Paroxysmal atrial fibrillation: Secondary | ICD-10-CM | POA: Insufficient documentation

## 2024-08-03 DIAGNOSIS — D6869 Other thrombophilia: Secondary | ICD-10-CM | POA: Insufficient documentation

## 2024-08-03 HISTORY — PX: ATRIAL FIBRILLATION ABLATION: EP1191

## 2024-08-03 LAB — POCT ACTIVATED CLOTTING TIME: Activated Clotting Time: 363 s

## 2024-08-03 SURGERY — ATRIAL FIBRILLATION ABLATION
Anesthesia: General

## 2024-08-03 MED ORDER — SODIUM CHLORIDE 0.9 % IV SOLN: INTRAVENOUS | Status: DC | PRN

## 2024-08-03 MED ORDER — ONDANSETRON HCL 4 MG/2ML IJ SOLN: 4.0000 mg | Freq: Four times a day (QID) | INTRAMUSCULAR | Status: DC | PRN

## 2024-08-03 MED ORDER — ACETAMINOPHEN 325 MG PO TABS: 650.0000 mg | ORAL_TABLET | ORAL | Status: DC | PRN

## 2024-08-03 MED ORDER — LACTATED RINGERS IV SOLN: INTRAVENOUS | Status: DC | PRN

## 2024-08-03 MED ORDER — HEPARIN SODIUM (PORCINE) 1000 UNIT/ML IJ SOLN
INTRAMUSCULAR | Status: DC | PRN
  Administered 2024-08-03: 12000 [IU] via INTRAVENOUS

## 2024-08-03 MED ORDER — SODIUM CHLORIDE 0.9 % IV SOLN: 250.0000 mL | INTRAVENOUS | Status: DC | PRN

## 2024-08-03 MED ORDER — MIDAZOLAM HCL 2 MG/2ML IJ SOLN
INTRAMUSCULAR | Status: AC
  Filled 2024-08-03: qty 2

## 2024-08-03 MED ORDER — PHENYLEPHRINE 80 MCG/ML (10ML) SYRINGE FOR IV PUSH (FOR BLOOD PRESSURE SUPPORT)
PREFILLED_SYRINGE | INTRAVENOUS | Status: DC | PRN
  Administered 2024-08-03: 160 ug via INTRAVENOUS

## 2024-08-03 MED ORDER — FENTANYL CITRATE (PF) 250 MCG/5ML IJ SOLN
INTRAMUSCULAR | Status: DC | PRN
  Administered 2024-08-03 (×2): 50 ug via INTRAVENOUS

## 2024-08-03 MED ORDER — SODIUM CHLORIDE 0.9% FLUSH: 3.0000 mL | INTRAVENOUS | Status: DC | PRN

## 2024-08-03 MED ORDER — DEXAMETHASONE SOD PHOSPHATE PF 10 MG/ML IJ SOLN
INTRAMUSCULAR | Status: DC | PRN
  Administered 2024-08-03: 10 mg via INTRAVENOUS

## 2024-08-03 MED ORDER — LIDOCAINE 2% (20 MG/ML) 5 ML SYRINGE
INTRAMUSCULAR | Status: DC | PRN
  Administered 2024-08-03: 60 mg via INTRAVENOUS
  Administered 2024-08-03: 40 mg via INTRAVENOUS

## 2024-08-03 MED ORDER — PROTAMINE SULFATE 10 MG/ML IV SOLN
INTRAVENOUS | Status: DC | PRN
  Administered 2024-08-03: 20 mg via INTRAVENOUS
  Administered 2024-08-03: 30 mg via INTRAVENOUS

## 2024-08-03 MED ORDER — EPHEDRINE SULFATE-NACL 50-0.9 MG/10ML-% IV SOSY
PREFILLED_SYRINGE | INTRAVENOUS | Status: DC | PRN
  Administered 2024-08-03 (×2): 5 mg via INTRAVENOUS

## 2024-08-03 MED ORDER — FENTANYL CITRATE (PF) 100 MCG/2ML IJ SOLN
INTRAMUSCULAR | Status: AC
  Filled 2024-08-03: qty 2

## 2024-08-03 MED ORDER — PHENYLEPHRINE HCL-NACL 20-0.9 MG/250ML-% IV SOLN
INTRAVENOUS | Status: DC | PRN
  Administered 2024-08-03: 30 ug/min via INTRAVENOUS

## 2024-08-03 MED ORDER — ROCURONIUM BROMIDE 10 MG/ML (PF) SYRINGE
PREFILLED_SYRINGE | INTRAVENOUS | Status: DC | PRN
  Administered 2024-08-03: 70 mg via INTRAVENOUS

## 2024-08-03 MED ORDER — SUGAMMADEX SODIUM 200 MG/2ML IV SOLN
INTRAVENOUS | Status: DC | PRN
  Administered 2024-08-03: 150 mg via INTRAVENOUS

## 2024-08-03 MED ORDER — SODIUM CHLORIDE 0.9% FLUSH: 3.0000 mL | Freq: Two times a day (BID) | INTRAVENOUS | Status: DC

## 2024-08-03 MED ORDER — HEPARIN (PORCINE) IN NACL 1000-0.9 UT/500ML-% IV SOLN
INTRAVENOUS | Status: DC | PRN
  Administered 2024-08-03 (×2): 500 mL

## 2024-08-03 MED ORDER — SODIUM CHLORIDE 0.9 % IV SOLN: INTRAVENOUS | Status: DC

## 2024-08-03 MED ORDER — ATROPINE SULFATE 1 MG/10ML IJ SOSY
PREFILLED_SYRINGE | INTRAMUSCULAR | Status: DC | PRN
  Administered 2024-08-03: 1 mg via INTRAVENOUS

## 2024-08-03 MED ORDER — ONDANSETRON HCL 4 MG/2ML IJ SOLN
INTRAMUSCULAR | Status: DC | PRN
  Administered 2024-08-03: 4 mg via INTRAVENOUS

## 2024-08-03 SURGICAL SUPPLY — 19 items
BLANKET WARM UNDERBOD FULL ACC (MISCELLANEOUS) ×2 IMPLANT
CABLE FARASTAR GEN2 SNGL USE (CABLE) IMPLANT
CATH ACUNAV GE 8F-90 (CATHETERS) IMPLANT
CATH FARAWAVE NAV 31 (CATHETERS) IMPLANT
CATH WEB BIDIR CS D-F NONAUTO (CATHETERS) IMPLANT
CLOSURE PERCLOSE PROSTYLE (Vascular Products) IMPLANT
COVER SWIFTLINK CONNECTOR (BAG) ×2 IMPLANT
DEVICE CLOSURE MYNXGRIP 6/7F (Vascular Products) IMPLANT
DILATOR VESSEL 38 20CM 16FR (INTRODUCER) IMPLANT
GUIDEWIRE INQWIRE 1.5J.035X260 (WIRE) IMPLANT
KIT PATCH RHYTHMIA HDX (MISCELLANEOUS) IMPLANT
KIT VERSACROSS CNCT FARADRIVE (KITS) IMPLANT
PACK EP LF (CUSTOM PROCEDURE TRAY) ×2 IMPLANT
PAD DEFIB RADIO PHYSIO CONN (PAD) ×2 IMPLANT
SHEATH FARADRIVE STEERABLE (SHEATH) IMPLANT
SHEATH PINNACLE 8F 10CM (SHEATH) IMPLANT
SHEATH PINNACLE 9F 10CM (SHEATH) IMPLANT
SHEATH PROBE COVER 6X72 (BAG) IMPLANT
WIRE HI TORQ VERSACORE-J 145CM (WIRE) IMPLANT

## 2024-08-03 NOTE — H&P (Signed)
 Electrophysiology Office Note:    Date:  08/03/2024   ID:  Rachel Mcintosh, Rachel Mcintosh 08-Nov-1955, MRN [id number]  PCP:  Rilla Baller, MD   [facility] Providers Cardiologist:  Newman JINNY Lawrence, MD     Referring MD: No ref. provider found   History of Present Illness:    Rachel Mcintosh is a 68 y.o. female with a medical history significant for paroxysmal atrial fibrillation, COPD, aortic atherosclerosis referred for atrial fibrillation management.       History of Present Illness  The patient complained of palpitations and wore a monitor in November 2024 that showed brief nonsustained SVT.    In clinic in follow-up on February 18, 2024, she was noted to have a rapid and irregular rhythm.  Twelve-lead EKG showed atrial fibrillation, and a monitor was placed that documented a 19% burden of A-fib.  Rates were overall controlled with an average rate of less than 100 bpm, but at times her rates peak to above 220 bpm.  She was often symptomatic with atrial fibrillation.  She does have occasional falls because she has a left BKA.      I reviewed the patient's interim notes and labs. she  has not missed one dose of anticoagulation almost two weeks ago. She hasn't had a significant AF episode since then. she took her dose last night. There have been no changes in the patient's diagnoses, medications, or condition since our recent clinic visit but she was briefly treated for a respiratory illness that has since resolved.  Today, she reports that she feels well and has no complaints.   EKGs/Labs/Other Studies Reviewed Today:     Echocardiogram:  TTE April 28, 2024 LVEF 70 to 75%.  Normal RV function.  Normal mitral valve structure and function.   Monitors:  14 day monitor July 2025 -- my interpretation Sinus rhythm heart rate 44 to 129 bpm, 72 BPM average 19% burden of atrial fibrillation, rates 56 to 220 bpm, average 97 bpm Less than 1% ectopy burden Symptoms  correlate with atrial fibrillation  EKG:         Physical Exam:    VS:  BP (!) 149/86   Pulse 60   Temp 98.3 F (36.8 C) (Oral)   Resp 15   Ht 5' 6 (1.676 m)   Wt 72.6 kg   SpO2 98%   BMI 25.82 kg/m     Wt Readings from Last 3 Encounters:  08/03/24 72.6 kg  06/28/24 73.5 kg  06/20/24 72.8 kg     GEN: Well nourished, well developed in no acute distress CARDIAC: RRR, no murmurs, rubs, gallops RESPIRATORY:  Normal work of breathing MUSCULOSKELETAL: no edema    ASSESSMENT & PLAN:     Paroxysmal atrial fibrillation Symptomatic with rapid palpitations 2-week monitor showed 19% A-fib burden We discussed management options including conservative approach, rhythm control with intermittent drugs, or ablation.  I recommended ablation, and using a shared decision making approach decided to schedule the procedure.  We discussed the indication, rationale, logistics, anticipated benefits, and potential risks of the ablation procedure including but not limited to -- bleed at the groin access site, chest pain, damage to nearby organs such as the diaphragm, lungs, or esophagus, need for a drainage tube, or prolonged hospitalization. I explained that the risk for stroke, heart attack, need for open chest surgery, or even death is very low but not zero. she  expressed understanding and wishes to proceed.   Secondary hypercoagulable state CHA2DS2-VASc  score is 4 Continue Eliquis  5 mg twice daily I would be more proactive discontinuing Eliquis  because of her fall risk with left BKA  COPD Former smoker  Hypertension Blood pressure reasonably controlled today   Signed, Eulas FORBES Furbish, MD  08/03/2024 7:04 AM

## 2024-08-03 NOTE — Progress Notes (Signed)
 Client up to bathroom and tolerated well; bilat groins stable, no bleeding or hematoma; client voided without difficulty

## 2024-08-03 NOTE — Anesthesia Postprocedure Evaluation (Signed)
 Anesthesia Post Note  Patient: Rachel Mcintosh  Procedure(s) Performed: ATRIAL FIBRILLATION ABLATION     Patient location during evaluation: PACU Anesthesia Type: General Level of consciousness: awake and alert Pain management: pain level controlled Vital Signs Assessment: post-procedure vital signs reviewed and stable Respiratory status: spontaneous breathing, nonlabored ventilation, respiratory function stable and patient connected to nasal cannula oxygen Cardiovascular status: blood pressure returned to baseline and stable Postop Assessment: no apparent nausea or vomiting Anesthetic complications: no   There were no known notable events for this encounter.  Last Vitals:  Vitals:   08/03/24 1015 08/03/24 1030  BP: 112/69 111/67  Pulse: 66 69  Resp: 15 15  Temp:    SpO2: 97% 97%    Last Pain:  Vitals:   08/03/24 0906  TempSrc: Oral                 [name] Cornea

## 2024-08-03 NOTE — Transfer of Care (Signed)
 Immediate Anesthesia Transfer of Care Note  Patient: Rachel Mcintosh  Procedure(s) Performed: ATRIAL FIBRILLATION ABLATION  Patient Location: PACU  Anesthesia Type:General  Level of Consciousness: awake, alert , oriented, and patient cooperative  Airway & Oxygen Therapy: Patient Spontanous Breathing  Post-op Assessment: Report given to RN and Post -op Vital signs reviewed and stable  Post vital signs: Reviewed and stable  Last Vitals:  Vitals Value Taken Time  BP 116/89 08/03/24 0902  Temp 36.2 08/03/24 0830  Pulse 85 08/03/24 09:02  Resp 17 08/03/24 09:02  SpO2 94 % 08/03/24 09:02  Vitals shown include unfiled device data.  Last Pain:  Vitals:   08/03/24 0609  TempSrc: Oral         Complications: There were no known notable events for this encounter.

## 2024-08-03 NOTE — Discharge Instructions (Signed)

## 2024-08-03 NOTE — Anesthesia Procedure Notes (Signed)
 Procedure Name: Intubation Date/Time: 08/03/2024 7:39 AM  Performed by: Oley Aleck LABOR, CRNAPre-anesthesia Checklist: Patient identified, Emergency Drugs available, Suction available and Patient being monitored Patient Re-evaluated:Patient Re-evaluated prior to induction Oxygen Delivery Method: Circle system utilized Preoxygenation: Pre-oxygenation with 100% oxygen Induction Type: IV induction Ventilation: Mask ventilation without difficulty Laryngoscope Size: Mac and 3 Grade View: Grade III Tube type: Oral Tube size: 7.0 mm Number of attempts: 1 Airway Equipment and Method: Stylet Placement Confirmation: ETT inserted through vocal cords under direct vision, positive ETCO2 and breath sounds checked- equal and bilateral Secured at: 21 cm Tube secured with: Tape Dental Injury: Teeth and Oropharynx as per pre-operative assessment  Comments: intubated by C. Randall Rampersad, CRNA; ebbs  cta

## 2024-08-03 NOTE — Anesthesia Preprocedure Evaluation (Addendum)
 Anesthesia Evaluation  Patient identified by MRN, date of birth, ID band Patient awake    Reviewed: Allergy & Precautions, NPO status , Patient's Chart, lab work & pertinent test results, reviewed documented beta blocker date and time   History of Anesthesia Complications Negative for: history of anesthetic complications  Airway Mallampati: III  TM Distance: >3 FB Neck ROM: Limited  Mouth opening: Limited Mouth Opening  Dental no notable dental hx.    Pulmonary shortness of breath and with exertion, pneumonia, resolved, COPD, neg recent URI, former smoker   breath sounds clear to auscultation       Cardiovascular hypertension, (-) CAD, (-) Past MI and (-) Cardiac Stents + dysrhythmias Atrial Fibrillation  Rhythm:Regular Rate:Normal  IMPRESSIONS     1. Left ventricular ejection fraction, by estimation, is 70 to 75%. Left  ventricular ejection fraction by 3D volume is 73 %. The left ventricle has  normal function. The left ventricle has no regional wall motion  abnormalities. Left ventricular diastolic   parameters were normal.   2. Right ventricular systolic function is normal. The right ventricular  size is normal.   3. The mitral valve is normal in structure. Mild mitral valve  regurgitation.   4. The aortic valve is tricuspid. Aortic valve regurgitation is mild.   5. The inferior vena cava is normal in size with greater than 50%  respiratory variability, suggesting right atrial pressure of 3 mmHg.      Neuro/Psych neg Seizures PSYCHIATRIC DISORDERS Anxiety        GI/Hepatic ,GERD  ,,(+) neg Cirrhosis        Endo/Other    Renal/GU Renal disease     Musculoskeletal   Abdominal   Peds  Hematology   Anesthesia Other Findings   Reproductive/Obstetrics                              Anesthesia Physical Anesthesia Plan  ASA: 2  Anesthesia Plan: General   Post-op Pain Management:     Induction: Intravenous  PONV Risk Score and Plan: 2 and Ondansetron  and Dexamethasone   Airway Management Planned: Oral ETT  Additional Equipment:   Intra-op Plan:   Post-operative Plan: Extubation in OR  Informed Consent: I have reviewed the patients History and Physical, chart, labs and discussed the procedure including the risks, benefits and alternatives for the proposed anesthesia with the patient or authorized representative who has indicated his/her understanding and acceptance.     Dental advisory given  Plan Discussed with: CRNA  Anesthesia Plan Comments:          Anesthesia Quick Evaluation

## 2024-08-04 ENCOUNTER — Encounter (HOSPITAL_COMMUNITY): Payer: Self-pay | Admitting: Cardiovascular Disease

## 2024-09-05 ENCOUNTER — Ambulatory Visit (HOSPITAL_COMMUNITY)
Admit: 2024-09-05 | Discharge: 2024-09-05 | Disposition: A | Discharge status: Home / Self Care | Attending: Internal Medicine | Admitting: Internal Medicine

## 2024-09-05 ENCOUNTER — Encounter (HOSPITAL_COMMUNITY): Payer: Self-pay | Admitting: Internal Medicine

## 2024-09-05 VITALS — BP 134/90 | HR 75 | Ht 66.0 in | Wt 163.2 lb

## 2024-09-05 DIAGNOSIS — I480 Paroxysmal atrial fibrillation: Secondary | ICD-10-CM | POA: Diagnosis not present

## 2024-09-05 DIAGNOSIS — D6869 Other thrombophilia: Secondary | ICD-10-CM | POA: Diagnosis not present

## 2024-09-05 NOTE — Addendum Note (Signed)
 Encounter addended by: Janel Nancy SAUNDERS, RN on: 09/05/2024 2:47 PM  Actions taken: Diagnosis association updated, Order list changed

## 2024-09-05 NOTE — Addendum Note (Signed)
 Encounter addended by: Terra Fairy PARAS, PA-C on: 09/05/2024 3:25 PM  Actions taken: Flowsheet accepted

## 2024-09-05 NOTE — Progress Notes (Signed)
 "   Primary Care Physician: Rilla Baller, MD Primary Cardiologist: Newman JINNY Lawrence, MD Electrophysiologist: None     Referring Physician: Dr. Rilla Race Rachel Mcintosh is a 69 y.o. female with a history of COPD, pulmonary hypertension, aortic atherosclerosis, HTN, and atrial fibrillation who presents for consultation in the [facility]. Noted by PCP on 02/18/24 to be likely Afib with RVR. Cardiac monitor placed by PCP showed 19% Afib burden. Patient is on Eliquis  5 mg BID for a CHADS2VASC score of 4.  On follow up 09/05/2024, patient is currently in NSR. S/p Afib ablation on 08/03/24 by Dr. Nancey. No episodes of Afib since ablation. No chest pain or SOB. Leg sites healed without issue. No missed doses of anticoagulant.  Today, she denies symptoms of orthopnea, PND, lower extremity edema, dizziness, presyncope, syncope, snoring, daytime somnolence, bleeding, or neurologic sequela. The patient is tolerating medications without difficulties and is otherwise without complaint today.     Atrial Fibrillation Risk Factors:  she does have symptoms or diagnosis of sleep apnea.  she has a BMI of Body mass index is 26.34 kg/m.SABRA Filed Weights   09/05/24 1328  Weight: 74 kg     Current Outpatient Medications  Medication Sig Dispense Refill   albuterol  (VENTOLIN  HFA) 108 (90 Base) MCG/ACT inhaler Inhale 2 puffs into the lungs every 6 (six) hours as needed for wheezing or shortness of breath. 8 g 3   alendronate  (FOSAMAX ) 70 MG tablet Take 1 tablet (70 mg total) by mouth once a week. Take with a full glass of water on an empty stomach. (Patient taking differently: Take 70 mg by mouth once a week. Take with a full glass of water on an empty stomach.Sundays) 12 tablet 3   ALPRAZolam  (XANAX ) 1 MG tablet TAKE 1 TABLET BY MOUTH TWICE A DAY AS NEEDED FOR ANXIETY 30 tablet 0   apixaban  (ELIQUIS ) 5 MG TABS tablet Take 1 tablet (5 mg total) by mouth 2 (two) times  daily. 60 tablet 11   Calcium  Carbonate-Vitamin D3 (CALCIUM  600/VITAMIN D ) 600-400 MG-UNIT TABS Take 1 tablet by mouth daily.     Cholecalciferol (VITAMIN D3) 25 MCG (1000 UT) CAPS Take 1 capsule (1,000 Units total) by mouth daily.     cyanocobalamin  (V-R VITAMIN B-12) 500 MCG tablet Take 1 tablet (500 mcg total) by mouth every other day.     ezetimibe  (ZETIA ) 10 MG tablet Take 1 tablet (10 mg total) by mouth daily. 90 tablet 3   metoprolol  succinate (TOPROL -XL) 25 MG 24 hr tablet Take 1 tablet (25 mg total) by mouth daily. 90 tablet 3   omeprazole  (PRILOSEC) 20 MG capsule Take 1 capsule (20 mg total) by mouth daily. 90 capsule 1   valACYclovir  (VALTREX ) 1000 MG tablet Take 2 tablets (2,000 mg total) by mouth 2 (two) times daily. Per flare 20 tablet 3   No current facility-administered medications for this encounter.    Atrial Fibrillation Management history:  Previous antiarrhythmic drugs: none Previous cardioversions: none Previous ablations: 08/03/24 Anticoagulation history: Eliquis    ROS- All systems are reviewed and negative except as per the HPI above.  Physical Exam: BP (!) 134/90   Pulse 75   Ht 5' 6 (1.676 m)   Wt 74 kg   BMI 26.34 kg/m   GEN- The patient is well appearing, alert and oriented x 3 today.   Neck - no JVD or carotid bruit noted Lungs- Clear to ausculation bilaterally, normal work of  breathing Heart- Regular rate and rhythm, no murmurs, rubs or gallops, PMI not laterally displaced Extremities- no clubbing, cyanosis, or edema Skin - no rash or ecchymosis noted   EKG today demonstrates  EKG Interpretation Date/Time:  Tuesday September 05 2024 13:31:12 EST Ventricular Rate:  75 PR Interval:  148 QRS Duration:  80 QT Interval:  394 QTC Calculation: 439 R Axis:   73  Text Interpretation: Normal sinus rhythm Cannot rule out Anterior infarct , age undetermined When compared with ECG of 03-Aug-2024 09:18, PREVIOUS ECG IS PRESENT Confirmed by Terra Pac  ([id number]) on 09/05/2024 2:00:39 PM    Echo 04/28/2024: 1. Left ventricular ejection fraction, by estimation, is 70 to 75%. Left  ventricular ejection fraction by 3D volume is 73 %. The left ventricle has  normal function. The left ventricle has no regional wall motion  abnormalities. Left ventricular diastolic   parameters were normal.   2. Right ventricular systolic function is normal. The right ventricular  size is normal.   3. The mitral valve is normal in structure. Mild mitral valve  regurgitation.   4. The aortic valve is tricuspid. Aortic valve regurgitation is mild.   5. The inferior vena cava is normal in size with greater than 50%  respiratory variability, suggesting right atrial pressure of 3 mmHg.   ASSESSMENT & PLAN CHA2DS2-VASc Score = 4  The patient's score is based upon: CHF History: 0 HTN History: 1 Diabetes History: 0 Stroke History: 0 Vascular Disease History: 1 Age Score: 1 Gender Score: 1       ASSESSMENT AND PLAN: Paroxysmal Atrial Fibrillation (ICD10:  I48.0) The patient's CHA2DS2-VASc score is 4, indicating a 4.8% annual risk of stroke.   S/p A-fib ablation on 12//25 by Dr. Nancey  Patient is currently in NSR.  Continue Toprol  25 mg daily. We discussed what to expect in the recovery period following ablation.   Secondary Hypercoagulable State (ICD10:  D68.69) The patient is at significant risk for stroke/thromboembolism based upon her CHA2DS2-VASc Score of 4.  Continue Apixaban  (Eliquis ).  Continue Eliquis  5 mg BID.    Follow-up with EP as scheduled.   Terra Pac, PA-C  [facility] [address] [place], [place] [address] [phone]  "

## 2024-09-05 NOTE — Addendum Note (Signed)
 Encounter addended by: Janel Nancy SAUNDERS, RN on: 09/05/2024 3:17 PM  Actions taken: Order list changed, Diagnosis association updated

## 2024-09-13 ENCOUNTER — Telehealth: Payer: Self-pay | Admitting: *Deleted

## 2024-09-13 DIAGNOSIS — I4819 Other persistent atrial fibrillation: Secondary | ICD-10-CM

## 2024-09-13 DIAGNOSIS — I480 Paroxysmal atrial fibrillation: Secondary | ICD-10-CM

## 2024-09-13 DIAGNOSIS — I272 Pulmonary hypertension, unspecified: Secondary | ICD-10-CM

## 2024-09-13 NOTE — Telephone Encounter (Signed)
-----   Message from Nurse Nancy DEL, RN sent at 09/05/2024  3:25 PM EST ----- Regarding: Sleep study Pt will need a Sleep study. Thanks -Karlene RN [facility]

## 2024-10-04 ENCOUNTER — Telehealth: Admitting: Family Medicine

## 2024-10-04 ENCOUNTER — Encounter: Payer: Self-pay | Admitting: Family Medicine

## 2024-10-04 ENCOUNTER — Ambulatory Visit: Admitting: Family Medicine

## 2024-10-04 VITALS — BP 164/83 | HR 74 | Ht 66.0 in | Wt 163.0 lb

## 2024-10-04 DIAGNOSIS — K219 Gastro-esophageal reflux disease without esophagitis: Secondary | ICD-10-CM

## 2024-10-04 DIAGNOSIS — B001 Herpesviral vesicular dermatitis: Secondary | ICD-10-CM

## 2024-10-04 DIAGNOSIS — R030 Elevated blood-pressure reading, without diagnosis of hypertension: Secondary | ICD-10-CM | POA: Insufficient documentation

## 2024-10-04 DIAGNOSIS — I480 Paroxysmal atrial fibrillation: Secondary | ICD-10-CM

## 2024-10-04 MED ORDER — MUPIROCIN 2 % EX OINT: 1.0000 | TOPICAL_OINTMENT | Freq: Three times a day (TID) | CUTANEOUS | 0 refills | Status: AC

## 2024-10-04 MED ORDER — VALACYCLOVIR HCL 500 MG PO TABS: 500.0000 mg | ORAL_TABLET | Freq: Every day | ORAL | 3 refills | Status: AC

## 2024-10-04 MED ORDER — FAMOTIDINE 20 MG PO TABS: 20.0000 mg | ORAL_TABLET | Freq: Every day | ORAL | Status: AC

## 2024-10-04 NOTE — Assessment & Plan Note (Signed)
 Stable period on eliquis  and Toprol  XL - appreciate cardiology/EP care.

## 2024-10-04 NOTE — Assessment & Plan Note (Addendum)
 Recurrent issue, worse since 08/24/2024.  2-3 episodes in the past month. Treatment dose valtrex  has not been effective  Will start suppressive 500mg  daily x2-3 months, update with effect. Will Rx mupirocin  for nasal sores as needed.

## 2024-10-04 NOTE — Progress Notes (Signed)
 " Ph: [phone] Fax: [phone]   Patient ID: Rachel Mcintosh, female    DOB: [birth year], 69 y.o.   MRN: [id number]  Virtual visit completed through MyChart, a video enabled telemedicine application. Due to national recommendations of social distancing due to COVID-19, a virtual visit is felt to be most appropriate for this patient at this time. Reviewed limitations, risks, security and privacy concerns of performing a virtual visit and the availability of in person appointments. I also reviewed that there may be a patient responsible charge related to this service. The patient agreed to proceed.   Patient location: home Provider location: [place] at [facility], office Persons participating in this virtual visit: patient, provider   If any vitals were documented, they were collected by patient at home unless specified below.    BP (!) 164/83 (BP Location: Left Arm, Cuff Size: Normal) Comment: at home  Pulse 74 Comment: pt provided  Ht 5' 6 (1.676 m)   Wt 163 lb (73.9 kg)   SpO2 97% Comment: pt provided  BMI 26.31 kg/m   BP Readings from Last 3 Encounters:  10/04/24 (!) 164/83  09/05/24 (!) 134/90  08/03/24 122/68   CC: fever blisters  Subjective:   HPI: Rachel Mcintosh is a 69 y.o. female presenting on 10/04/2024 for Acute Visit (Continuous Fever blisters,come and go,  onset - chrismas//Would like to discuss meds)   Recurrent fever blisters since Christmas x2, one to right inner lip, latest to mid lower lip. This is despite valtrex  2gm BID x1d per treatment - without significant benefit despite this. Episodes last 2.5 wks.  Also had sores to right nare x1. Denies h/o MRSA.   No fevers/chills Stress levels ok.  Not immunocompromised  Recent afib followed by EP s/p ablation 08/03/2024. She continues eliquis  5mg  bid and Toprol  XL 25mg  daily.   EGD 11/2020 - dilated esophageal web, mild schatzki ring also dilated, 1cm HH (Gessner).  She continues omeprazole  20mg   daily since then but wonders if she still needs this.      Relevant past medical, surgical, family and social history reviewed and updated as indicated. Interim medical history since our last visit reviewed. Allergies and medications reviewed and updated. Outpatient Medications Prior to Visit  Medication Sig Dispense Refill   albuterol  (VENTOLIN  HFA) 108 (90 Base) MCG/ACT inhaler Inhale 2 puffs into the lungs every 6 (six) hours as needed for wheezing or shortness of breath. 8 g 3   alendronate  (FOSAMAX ) 70 MG tablet Take 1 tablet (70 mg total) by mouth once a week. Take with a full glass of water on an empty stomach. (Patient taking differently: Take 70 mg by mouth once a week. Take with a full glass of water on an empty stomach.Sundays) 12 tablet 3   ALPRAZolam  (XANAX ) 1 MG tablet TAKE 1 TABLET BY MOUTH TWICE A DAY AS NEEDED FOR ANXIETY 30 tablet 0   apixaban  (ELIQUIS ) 5 MG TABS tablet Take 1 tablet (5 mg total) by mouth 2 (two) times daily. 60 tablet 11   Calcium  Carbonate-Vitamin D3 (CALCIUM  600/VITAMIN D ) 600-400 MG-UNIT TABS Take 1 tablet by mouth daily.     Cholecalciferol (VITAMIN D3) 25 MCG (1000 UT) CAPS Take 1 capsule (1,000 Units total) by mouth daily.     cyanocobalamin  (V-R VITAMIN B-12) 500 MCG tablet Take 1 tablet (500 mcg total) by mouth every other day.     ezetimibe  (ZETIA ) 10 MG tablet Take 1 tablet (10 mg total) by mouth daily. 90  tablet 3   metoprolol  succinate (TOPROL -XL) 25 MG 24 hr tablet Take 1 tablet (25 mg total) by mouth daily. 90 tablet 3   omeprazole  (PRILOSEC) 20 MG capsule Take 1 capsule (20 mg total) by mouth daily. 90 capsule 1   valACYclovir  (VALTREX ) 1000 MG tablet Take 2 tablets (2,000 mg total) by mouth 2 (two) times daily. Per flare 20 tablet 3   No facility-administered medications prior to visit.     Per HPI unless specifically indicated in ROS section below Review of Systems Objective:  BP (!) 164/83 (BP Location: Left Arm, Cuff Size: Normal)  Comment: at home  Pulse 74 Comment: pt provided  Ht 5' 6 (1.676 m)   Wt 163 lb (73.9 kg)   SpO2 97% Comment: pt provided  BMI 26.31 kg/m   Wt Readings from Last 3 Encounters:  10/04/24 163 lb (73.9 kg)  09/05/24 163 lb 3.2 oz (74 kg)  08/03/24 160 lb (72.6 kg)       Physical exam: Gen: alert, NAD, not ill appearing Pulm: speaks in complete sentences without increased work of breathing Psych: normal mood, normal thought content      Assessment & Plan:   Gastroesophageal reflux disease, unspecified whether esophagitis present Assessment & Plan: No GERD symptoms on omeprazole  20mg  - desires to try off which is reasonable. No h/o Barrett's.  Will start pepcid  (famotidine ) 20mg  nightly OTC in its place.  Caution with Eliquis  use.    Herpes labialis Assessment & Plan: Recurrent issue, worse since 08/24/2024.  2-3 episodes in the past month. Treatment dose valtrex  has not been effective  Will start suppressive 500mg  daily x2-3 months, update with effect. Will Rx mupirocin  for nasal sores as needed.    Elevated blood pressure reading without diagnosis of hypertension Assessment & Plan: BP remaining elevated at home today.  Reviewed diet choices to control blood pressure, rec low sodium/salt diet.  Rec start monitoring BP at home and update in 1 wk with readings.  Continue toprol  XL 25mg   DASH diet handout sent via mychart.    Paroxysmal A-fib (HCC) Assessment & Plan: Stable period on eliquis  and Toprol  XL - appreciate cardiology/EP care.    Other orders -     Mupirocin ; Apply 1 Application topically 3 (three) times daily.  Dispense: 22 g; Refill: 0 -     valACYclovir  HCl; Take 1 tablet (500 mg total) by mouth daily. For fever blister prevention  Dispense: 30 tablet; Refill: 3 -     Famotidine ; Take 1 tablet (20 mg total) by mouth daily.     I discussed the assessment and treatment plan with the patient. The patient was provided an opportunity to ask questions and  all were answered. The patient agreed with the plan and demonstrated an understanding of the instructions. The patient was advised to call back or seek an in-person evaluation if the symptoms worsen or if the condition fails to improve as anticipated.  Follow up plan: No follow-ups on file.  Anton Blas, MD   "

## 2024-10-04 NOTE — Assessment & Plan Note (Addendum)
 BP remaining elevated at home today.  Reviewed diet choices to control blood pressure, rec low sodium/salt diet.  Rec start monitoring BP at home and update in 1 wk with readings.  Continue toprol  XL 25mg   DASH diet handout sent via mychart.

## 2024-10-04 NOTE — Assessment & Plan Note (Addendum)
 No GERD symptoms on omeprazole  20mg  - desires to try off which is reasonable. No h/o Barrett's.  Will start pepcid  (famotidine ) 20mg  nightly OTC in its place.  Caution with Eliquis  use.

## 2024-11-01 ENCOUNTER — Ambulatory Visit: Admitting: Pulmonary Disease

## 2024-11-09 ENCOUNTER — Other Ambulatory Visit

## 2024-11-21 ENCOUNTER — Ambulatory Visit: Admitting: Pulmonary Disease

## 2024-12-05 ENCOUNTER — Ambulatory Visit
